# Patient Record
Sex: Female | Born: 1935
Health system: Southern US, Community
[De-identification: ages and names within clinical notes are randomized; demographics above are authoritative.]

## PROBLEM LIST (undated history)

## (undated) DIAGNOSIS — I639 Cerebral infarction, unspecified: Secondary | ICD-10-CM

## (undated) DIAGNOSIS — N289 Disorder of kidney and ureter, unspecified: Secondary | ICD-10-CM

## (undated) DIAGNOSIS — M1990 Unspecified osteoarthritis, unspecified site: Secondary | ICD-10-CM

## (undated) DIAGNOSIS — I10 Essential (primary) hypertension: Secondary | ICD-10-CM

## (undated) DIAGNOSIS — I2510 Atherosclerotic heart disease of native coronary artery without angina pectoris: Secondary | ICD-10-CM

## (undated) DIAGNOSIS — I509 Heart failure, unspecified: Secondary | ICD-10-CM

## (undated) DIAGNOSIS — Z5189 Encounter for other specified aftercare: Secondary | ICD-10-CM

## (undated) HISTORY — PX: ABDOMINAL HYSTERECTOMY: SHX81

## (undated) HISTORY — PX: JOINT REPLACEMENT: SHX530

## (undated) HISTORY — PX: EYE SURGERY: SHX253

## (undated) HISTORY — PX: CARDIAC SURGERY: SHX584

---

## 2001-07-20 ENCOUNTER — Ambulatory Visit (HOSPITAL_COMMUNITY): Admission: RE | Admit: 2001-07-20 | Discharge: 2001-07-21 | Payer: Self-pay | Admitting: *Deleted

## 2004-08-10 ENCOUNTER — Other Ambulatory Visit: Payer: Self-pay

## 2004-08-10 ENCOUNTER — Inpatient Hospital Stay: Payer: Self-pay | Admitting: Cardiology

## 2004-08-11 ENCOUNTER — Other Ambulatory Visit: Payer: Self-pay

## 2004-08-12 ENCOUNTER — Other Ambulatory Visit: Payer: Self-pay

## 2004-08-14 ENCOUNTER — Inpatient Hospital Stay (HOSPITAL_COMMUNITY)
Admission: AD | Admit: 2004-08-14 | Discharge: 2004-08-15 | Payer: Self-pay | Admitting: Thoracic Surgery (Cardiothoracic Vascular Surgery)

## 2004-09-16 ENCOUNTER — Other Ambulatory Visit: Payer: Self-pay

## 2004-09-16 ENCOUNTER — Inpatient Hospital Stay: Payer: Self-pay | Admitting: Cardiology

## 2005-01-30 ENCOUNTER — Other Ambulatory Visit: Payer: Self-pay

## 2005-01-30 ENCOUNTER — Emergency Department: Payer: Self-pay | Admitting: Emergency Medicine

## 2005-02-03 ENCOUNTER — Ambulatory Visit: Payer: Self-pay

## 2005-02-05 ENCOUNTER — Observation Stay: Payer: Self-pay | Admitting: Internal Medicine

## 2005-02-19 ENCOUNTER — Emergency Department: Payer: Self-pay | Admitting: Emergency Medicine

## 2005-06-03 ENCOUNTER — Emergency Department: Payer: Self-pay | Admitting: Emergency Medicine

## 2005-06-03 ENCOUNTER — Other Ambulatory Visit: Payer: Self-pay

## 2006-04-12 ENCOUNTER — Inpatient Hospital Stay: Payer: Self-pay | Admitting: Internal Medicine

## 2006-04-12 ENCOUNTER — Other Ambulatory Visit: Payer: Self-pay

## 2006-05-10 ENCOUNTER — Ambulatory Visit (HOSPITAL_COMMUNITY): Admission: RE | Admit: 2006-05-10 | Discharge: 2006-05-11 | Payer: Self-pay | Admitting: *Deleted

## 2006-08-13 ENCOUNTER — Ambulatory Visit: Payer: Self-pay | Admitting: Specialist

## 2007-01-23 ENCOUNTER — Inpatient Hospital Stay: Payer: Self-pay | Admitting: Cardiology

## 2007-01-23 ENCOUNTER — Other Ambulatory Visit: Payer: Self-pay

## 2007-01-24 ENCOUNTER — Other Ambulatory Visit: Payer: Self-pay

## 2007-04-14 ENCOUNTER — Ambulatory Visit: Payer: Self-pay | Admitting: Internal Medicine

## 2007-06-22 ENCOUNTER — Other Ambulatory Visit: Payer: Self-pay

## 2007-06-22 ENCOUNTER — Emergency Department: Payer: Self-pay | Admitting: Unknown Physician Specialty

## 2007-06-29 ENCOUNTER — Inpatient Hospital Stay: Payer: Self-pay | Admitting: Specialist

## 2007-06-29 ENCOUNTER — Other Ambulatory Visit: Payer: Self-pay

## 2007-09-03 ENCOUNTER — Inpatient Hospital Stay: Payer: Self-pay | Admitting: Specialist

## 2007-09-03 ENCOUNTER — Other Ambulatory Visit: Payer: Self-pay

## 2007-09-05 ENCOUNTER — Other Ambulatory Visit: Payer: Self-pay

## 2008-04-24 ENCOUNTER — Ambulatory Visit: Payer: Self-pay | Admitting: Internal Medicine

## 2008-08-31 ENCOUNTER — Inpatient Hospital Stay: Payer: Self-pay | Admitting: *Deleted

## 2008-11-19 ENCOUNTER — Inpatient Hospital Stay: Payer: Self-pay | Admitting: Internal Medicine

## 2009-01-18 ENCOUNTER — Inpatient Hospital Stay: Payer: Self-pay | Admitting: Internal Medicine

## 2010-09-19 ENCOUNTER — Inpatient Hospital Stay: Payer: Self-pay | Admitting: Internal Medicine

## 2010-10-01 LAB — PATHOLOGY REPORT

## 2010-11-15 ENCOUNTER — Inpatient Hospital Stay: Payer: Self-pay | Admitting: Internal Medicine

## 2010-11-21 LAB — PATHOLOGY REPORT

## 2010-11-28 ENCOUNTER — Inpatient Hospital Stay: Payer: Self-pay | Admitting: *Deleted

## 2011-03-10 ENCOUNTER — Emergency Department: Payer: Self-pay | Admitting: *Deleted

## 2011-08-21 ENCOUNTER — Emergency Department: Payer: Self-pay | Admitting: *Deleted

## 2012-04-07 ENCOUNTER — Emergency Department: Payer: Self-pay | Admitting: Emergency Medicine

## 2012-04-07 LAB — COMPREHENSIVE METABOLIC PANEL
Albumin: 3.1 g/dL — ABNORMAL LOW (ref 3.4–5.0)
Alkaline Phosphatase: 93 U/L (ref 50–136)
Anion Gap: 7 (ref 7–16)
BUN: 28 mg/dL — ABNORMAL HIGH (ref 7–18)
Bilirubin,Total: 0.6 mg/dL (ref 0.2–1.0)
Calcium, Total: 9.4 mg/dL (ref 8.5–10.1)
Chloride: 112 mmol/L — ABNORMAL HIGH (ref 98–107)
Co2: 25 mmol/L (ref 21–32)
Creatinine: 1.37 mg/dL — ABNORMAL HIGH (ref 0.60–1.30)
EGFR (African American): 40 — ABNORMAL LOW
EGFR (Non-African Amer.): 35 — ABNORMAL LOW
Glucose: 123 mg/dL — ABNORMAL HIGH (ref 65–99)
Osmolality: 294 (ref 275–301)
Potassium: 4 mmol/L (ref 3.5–5.1)
SGOT(AST): 29 U/L (ref 15–37)
SGPT (ALT): 19 U/L (ref 12–78)
Sodium: 144 mmol/L (ref 136–145)
Total Protein: 7.1 g/dL (ref 6.4–8.2)

## 2012-04-07 LAB — URINALYSIS, COMPLETE
Bilirubin,UR: NEGATIVE
Glucose,UR: NEGATIVE mg/dL (ref 0–75)
Ketone: NEGATIVE
Nitrite: NEGATIVE
Ph: 6 (ref 4.5–8.0)
Protein: >=500
RBC,UR: 1 /HPF (ref 0–5)
Specific Gravity: 1.011 (ref 1.003–1.030)
Squamous Epithelial: NONE SEEN
WBC UR: 43 /HPF (ref 0–5)

## 2012-04-07 LAB — CBC WITH DIFFERENTIAL/PLATELET
Basophil #: 0 10*3/uL (ref 0.0–0.1)
Basophil %: 0.4 %
Eosinophil #: 0 10*3/uL (ref 0.0–0.7)
Eosinophil %: 0.8 %
HCT: 36.7 % (ref 35.0–47.0)
HGB: 12.5 g/dL (ref 12.0–16.0)
Lymphocyte #: 0.6 10*3/uL — ABNORMAL LOW (ref 1.0–3.6)
Lymphocyte %: 9.1 %
MCH: 34.6 pg — ABNORMAL HIGH (ref 26.0–34.0)
MCHC: 34.1 g/dL (ref 32.0–36.0)
MCV: 102 fL — ABNORMAL HIGH (ref 80–100)
Monocyte #: 0.6 x10 3/mm (ref 0.2–0.9)
Monocyte %: 9.5 %
Neutrophil #: 5 10*3/uL (ref 1.4–6.5)
Neutrophil %: 80.2 %
Platelet: 136 10*3/uL — ABNORMAL LOW (ref 150–440)
RBC: 3.61 10*6/uL — ABNORMAL LOW (ref 3.80–5.20)
RDW: 13.2 % (ref 11.5–14.5)
WBC: 6.2 10*3/uL (ref 3.6–11.0)

## 2012-04-07 LAB — TROPONIN I: Troponin-I: <0.02 ng/mL

## 2012-04-09 LAB — URINE CULTURE

## 2012-08-15 ENCOUNTER — Emergency Department: Payer: Self-pay | Admitting: Emergency Medicine

## 2012-08-16 ENCOUNTER — Other Ambulatory Visit: Payer: Self-pay | Admitting: Internal Medicine

## 2012-08-17 NOTE — Telephone Encounter (Signed)
She has not called and made an appt with me.  I am not going to refill these meds.  These need to be sent to [facility].

## 2012-08-20 ENCOUNTER — Emergency Department: Payer: Self-pay | Admitting: Emergency Medicine

## 2012-09-10 ENCOUNTER — Observation Stay: Payer: Self-pay | Admitting: Specialist

## 2012-09-10 LAB — RAPID INFLUENZA A&B ANTIGENS

## 2012-09-10 LAB — COMPREHENSIVE METABOLIC PANEL
Albumin: 2.6 g/dL — ABNORMAL LOW (ref 3.4–5.0)
Alkaline Phosphatase: 94 U/L (ref 50–136)
Anion Gap: 8 (ref 7–16)
BUN: 22 mg/dL — ABNORMAL HIGH (ref 7–18)
Bilirubin,Total: 0.4 mg/dL (ref 0.2–1.0)
Calcium, Total: 8.8 mg/dL (ref 8.5–10.1)
Chloride: 111 mmol/L — ABNORMAL HIGH (ref 98–107)
Co2: 24 mmol/L (ref 21–32)
Creatinine: 1.57 mg/dL — ABNORMAL HIGH (ref 0.60–1.30)
EGFR (African American): 34 — ABNORMAL LOW
EGFR (Non-African Amer.): 30 — ABNORMAL LOW
Glucose: 130 mg/dL — ABNORMAL HIGH (ref 65–99)
Osmolality: 290 (ref 275–301)
Potassium: 4.1 mmol/L (ref 3.5–5.1)
SGOT(AST): 21 U/L (ref 15–37)
SGPT (ALT): 13 U/L (ref 12–78)
Sodium: 143 mmol/L (ref 136–145)
Total Protein: 6.3 g/dL — ABNORMAL LOW (ref 6.4–8.2)

## 2012-09-10 LAB — CBC WITH DIFFERENTIAL/PLATELET
Basophil #: 0 10*3/uL (ref 0.0–0.1)
Basophil %: 0.3 %
Eosinophil #: 0.1 10*3/uL (ref 0.0–0.7)
Eosinophil %: 2.1 %
HCT: 34.1 % — ABNORMAL LOW (ref 35.0–47.0)
HGB: 11.7 g/dL — ABNORMAL LOW (ref 12.0–16.0)
Lymphocyte #: 1 10*3/uL (ref 1.0–3.6)
Lymphocyte %: 14.5 %
MCH: 34 pg (ref 26.0–34.0)
MCHC: 34.4 g/dL (ref 32.0–36.0)
MCV: 99 fL (ref 80–100)
Monocyte #: 0.8 x10 3/mm (ref 0.2–0.9)
Monocyte %: 11.6 %
Neutrophil #: 4.8 10*3/uL (ref 1.4–6.5)
Neutrophil %: 71.5 %
Platelet: 161 10*3/uL (ref 150–440)
RBC: 3.44 10*6/uL — ABNORMAL LOW (ref 3.80–5.20)
RDW: 13.4 % (ref 11.5–14.5)
WBC: 6.7 10*3/uL (ref 3.6–11.0)

## 2012-09-10 LAB — TROPONIN I
Troponin-I: <0.02 ng/mL
Troponin-I: <0.02 ng/mL

## 2012-09-10 LAB — CK TOTAL AND CKMB (NOT AT ARMC)
CK, Total: 24 U/L (ref 21–215)
CK, Total: 23 U/L (ref 21–215)
CK-MB: <0.5 ng/mL — ABNORMAL LOW (ref 0.5–3.6)
CK-MB: <0.5 ng/mL — ABNORMAL LOW (ref 0.5–3.6)

## 2012-09-10 LAB — HEMOGLOBIN A1C: Hemoglobin A1C: 5 % (ref 4.2–6.3)

## 2012-09-11 LAB — COMPREHENSIVE METABOLIC PANEL
Albumin: 2.4 g/dL — ABNORMAL LOW (ref 3.4–5.0)
Alkaline Phosphatase: 89 U/L (ref 50–136)
Anion Gap: 8 (ref 7–16)
BUN: 22 mg/dL — ABNORMAL HIGH (ref 7–18)
Bilirubin,Total: 0.3 mg/dL (ref 0.2–1.0)
Calcium, Total: 8.5 mg/dL (ref 8.5–10.1)
Chloride: 112 mmol/L — ABNORMAL HIGH (ref 98–107)
Co2: 22 mmol/L (ref 21–32)
Creatinine: 1.48 mg/dL — ABNORMAL HIGH (ref 0.60–1.30)
EGFR (African American): 37 — ABNORMAL LOW
EGFR (Non-African Amer.): 32 — ABNORMAL LOW
Glucose: 136 mg/dL — ABNORMAL HIGH (ref 65–99)
Osmolality: 289 (ref 275–301)
Potassium: 4.3 mmol/L (ref 3.5–5.1)
SGOT(AST): 19 U/L (ref 15–37)
SGPT (ALT): 13 U/L (ref 12–78)
Sodium: 142 mmol/L (ref 136–145)
Total Protein: 6.3 g/dL — ABNORMAL LOW (ref 6.4–8.2)

## 2012-09-11 LAB — CBC WITH DIFFERENTIAL/PLATELET
Basophil #: 0 10*3/uL (ref 0.0–0.1)
Basophil %: 0.1 %
Eosinophil #: 0 10*3/uL (ref 0.0–0.7)
Eosinophil %: 0 %
HCT: 33.2 % — ABNORMAL LOW (ref 35.0–47.0)
HGB: 11.3 g/dL — ABNORMAL LOW (ref 12.0–16.0)
Lymphocyte #: 0.5 10*3/uL — ABNORMAL LOW (ref 1.0–3.6)
Lymphocyte %: 22.8 %
MCH: 33.5 pg (ref 26.0–34.0)
MCHC: 33.9 g/dL (ref 32.0–36.0)
MCV: 99 fL (ref 80–100)
Monocyte #: 0.1 x10 3/mm — ABNORMAL LOW (ref 0.2–0.9)
Monocyte %: 3.3 %
Neutrophil #: 1.7 10*3/uL (ref 1.4–6.5)
Neutrophil %: 73.8 %
Platelet: 130 10*3/uL — ABNORMAL LOW (ref 150–440)
RBC: 3.36 10*6/uL — ABNORMAL LOW (ref 3.80–5.20)
RDW: 13.4 % (ref 11.5–14.5)
WBC: 2.4 10*3/uL — ABNORMAL LOW (ref 3.6–11.0)

## 2012-09-11 LAB — CK TOTAL AND CKMB (NOT AT ARMC)
CK, Total: 20 U/L — ABNORMAL LOW (ref 21–215)
CK-MB: 0.5 ng/mL (ref 0.5–3.6)

## 2012-09-11 LAB — OCCULT BLOOD X 1 CARD TO LAB, STOOL: Occult Blood, Feces: NEGATIVE

## 2012-09-11 LAB — TROPONIN I: Troponin-I: <0.02 ng/mL

## 2012-09-12 LAB — CBC WITH DIFFERENTIAL/PLATELET
Basophil #: 0 10*3/uL (ref 0.0–0.1)
Basophil %: 0.1 %
Eosinophil #: 0 10*3/uL (ref 0.0–0.7)
Eosinophil %: 0 %
HCT: 35.8 % (ref 35.0–47.0)
HGB: 12 g/dL (ref 12.0–16.0)
Lymphocyte #: 0.8 10*3/uL — ABNORMAL LOW (ref 1.0–3.6)
Lymphocyte %: 11 %
MCH: 33.1 pg (ref 26.0–34.0)
MCHC: 33.4 g/dL (ref 32.0–36.0)
MCV: 99 fL (ref 80–100)
Monocyte #: 0.4 x10 3/mm (ref 0.2–0.9)
Monocyte %: 5.9 %
Neutrophil #: 6 10*3/uL (ref 1.4–6.5)
Neutrophil %: 83 %
Platelet: 178 10*3/uL (ref 150–440)
RBC: 3.62 10*6/uL — ABNORMAL LOW (ref 3.80–5.20)
RDW: 13.4 % (ref 11.5–14.5)
WBC: 7.3 10*3/uL (ref 3.6–11.0)

## 2012-09-16 LAB — CULTURE, BLOOD (SINGLE)

## 2012-10-12 ENCOUNTER — Other Ambulatory Visit: Payer: Self-pay | Admitting: Internal Medicine

## 2012-10-12 NOTE — Telephone Encounter (Signed)
Pt has not been seen yet.

## 2012-10-15 NOTE — Telephone Encounter (Signed)
Refill request for prilosec.  Pt has not been seen in office yet and has no upcoming appts scheduled.  Please advise.

## 2012-10-16 NOTE — Telephone Encounter (Signed)
Need to notify apothecary ([facility])  that pt has not scheduled an appt here - need to forward refill request to [facility] clinic

## 2013-01-06 LAB — COMPREHENSIVE METABOLIC PANEL
Albumin: 2.7 g/dL — ABNORMAL LOW (ref 3.4–5.0)
Alkaline Phosphatase: 96 U/L (ref 50–136)
Anion Gap: 7 (ref 7–16)
BUN: 42 mg/dL — ABNORMAL HIGH (ref 7–18)
Bilirubin,Total: 0.2 mg/dL (ref 0.2–1.0)
Calcium, Total: 8.5 mg/dL (ref 8.5–10.1)
Chloride: 113 mmol/L — ABNORMAL HIGH (ref 98–107)
Co2: 23 mmol/L (ref 21–32)
Creatinine: 1.97 mg/dL — ABNORMAL HIGH (ref 0.60–1.30)
EGFR (African American): 26 — ABNORMAL LOW
EGFR (Non-African Amer.): 22 — ABNORMAL LOW
Glucose: 175 mg/dL — ABNORMAL HIGH (ref 65–99)
Osmolality: 300 (ref 275–301)
Potassium: 4.4 mmol/L (ref 3.5–5.1)
SGOT(AST): 27 U/L (ref 15–37)
SGPT (ALT): 19 U/L (ref 12–78)
Sodium: 143 mmol/L (ref 136–145)
Total Protein: 6 g/dL — ABNORMAL LOW (ref 6.4–8.2)

## 2013-01-06 LAB — CBC WITH DIFFERENTIAL/PLATELET
Basophil #: 0 10*3/uL (ref 0.0–0.1)
Basophil %: 0.2 %
Eosinophil #: 0.1 10*3/uL (ref 0.0–0.7)
Eosinophil %: 1.8 %
HCT: 31.3 % — ABNORMAL LOW (ref 35.0–47.0)
HGB: 10.6 g/dL — ABNORMAL LOW (ref 12.0–16.0)
Lymphocyte #: 0.7 10*3/uL — ABNORMAL LOW (ref 1.0–3.6)
Lymphocyte %: 9.6 %
MCH: 34.4 pg — ABNORMAL HIGH (ref 26.0–34.0)
MCHC: 33.8 g/dL (ref 32.0–36.0)
MCV: 102 fL — ABNORMAL HIGH (ref 80–100)
Monocyte #: 0.5 x10 3/mm (ref 0.2–0.9)
Monocyte %: 7.7 %
Neutrophil #: 5.4 10*3/uL (ref 1.4–6.5)
Neutrophil %: 80.7 %
Platelet: 154 10*3/uL (ref 150–440)
RBC: 3.07 10*6/uL — ABNORMAL LOW (ref 3.80–5.20)
RDW: 13.2 % (ref 11.5–14.5)
WBC: 6.8 10*3/uL (ref 3.6–11.0)

## 2013-01-06 LAB — URINALYSIS, COMPLETE
Bilirubin,UR: NEGATIVE
Glucose,UR: 50 mg/dL (ref 0–75)
Hyaline Cast: 5
Ketone: NEGATIVE
Nitrite: NEGATIVE
Ph: 6 (ref 4.5–8.0)
Protein: >=500
Squamous Epithelial: <1
WBC UR: 3 /HPF (ref 0–5)

## 2013-01-06 LAB — CK TOTAL AND CKMB (NOT AT ARMC)
CK, Total: 29 U/L (ref 21–215)
CK-MB: 0.6 ng/mL (ref 0.5–3.6)

## 2013-01-06 LAB — PROTIME-INR
INR: 1
Prothrombin Time: 13 secs (ref 11.5–14.7)

## 2013-01-06 LAB — TROPONIN I: Troponin-I: <0.02 ng/mL

## 2013-01-07 ENCOUNTER — Inpatient Hospital Stay: Payer: Self-pay | Admitting: Internal Medicine

## 2013-01-07 LAB — CK TOTAL AND CKMB (NOT AT ARMC)
CK, Total: 70 U/L (ref 21–215)
CK, Total: 70 U/L (ref 21–215)
CK-MB: 1.5 ng/mL (ref 0.5–3.6)

## 2013-01-07 LAB — TROPONIN I: Troponin-I: 0.03 ng/mL

## 2013-01-07 LAB — OCCULT BLOOD X 1 CARD TO LAB, STOOL: Occult Blood, Feces: NEGATIVE

## 2013-01-08 LAB — COMPREHENSIVE METABOLIC PANEL
Albumin: 2.4 g/dL — ABNORMAL LOW (ref 3.4–5.0)
Anion Gap: 6 — ABNORMAL LOW (ref 7–16)
BUN: 26 mg/dL — ABNORMAL HIGH (ref 7–18)
Bilirubin,Total: 0.3 mg/dL (ref 0.2–1.0)
Chloride: 116 mmol/L — ABNORMAL HIGH (ref 98–107)
Creatinine: 1.48 mg/dL — ABNORMAL HIGH (ref 0.60–1.30)
EGFR (African American): 37 — ABNORMAL LOW
EGFR (Non-African Amer.): 32 — ABNORMAL LOW
Osmolality: 293 (ref 275–301)
Potassium: 4.1 mmol/L (ref 3.5–5.1)
SGOT(AST): 48 U/L — ABNORMAL HIGH (ref 15–37)
SGPT (ALT): 18 U/L (ref 12–78)
Sodium: 145 mmol/L (ref 136–145)
Total Protein: 5.5 g/dL — ABNORMAL LOW (ref 6.4–8.2)

## 2013-01-08 LAB — MAGNESIUM: Magnesium: 1.8 mg/dL

## 2013-01-08 LAB — LIPID PANEL
Cholesterol: 119 mg/dL (ref 0–200)
HDL Cholesterol: 51 mg/dL (ref 40–60)
Triglycerides: 82 mg/dL (ref 0–200)

## 2013-01-08 LAB — CBC WITH DIFFERENTIAL/PLATELET
Basophil #: 0 10*3/uL (ref 0.0–0.1)
Basophil %: 0.6 %
HCT: 30.3 % — ABNORMAL LOW (ref 35.0–47.0)
Lymphocyte %: 26.5 %
MCH: 34.4 pg — ABNORMAL HIGH (ref 26.0–34.0)
MCV: 101 fL — ABNORMAL HIGH (ref 80–100)
Neutrophil #: 2.3 10*3/uL (ref 1.4–6.5)
Neutrophil %: 55.2 %
Platelet: 142 10*3/uL — ABNORMAL LOW (ref 150–440)
RDW: 12.9 % (ref 11.5–14.5)
WBC: 4.2 10*3/uL (ref 3.6–11.0)

## 2013-01-08 LAB — TSH: Thyroid Stimulating Horm: 1.24 u[IU]/mL

## 2013-01-15 ENCOUNTER — Emergency Department: Payer: Self-pay | Admitting: Emergency Medicine

## 2013-01-15 LAB — COMPREHENSIVE METABOLIC PANEL
Albumin: 3 g/dL — ABNORMAL LOW (ref 3.4–5.0)
Alkaline Phosphatase: 107 U/L (ref 50–136)
Anion Gap: 8 (ref 7–16)
Bilirubin,Total: 0.2 mg/dL (ref 0.2–1.0)
Calcium, Total: 9.3 mg/dL (ref 8.5–10.1)
Chloride: 109 mmol/L — ABNORMAL HIGH (ref 98–107)
Co2: 23 mmol/L (ref 21–32)
EGFR (African American): 20 — ABNORMAL LOW
Glucose: 148 mg/dL — ABNORMAL HIGH (ref 65–99)
Osmolality: 290 (ref 275–301)
Potassium: 4.4 mmol/L (ref 3.5–5.1)
SGOT(AST): 27 U/L (ref 15–37)
SGPT (ALT): 17 U/L (ref 12–78)
Total Protein: 7.2 g/dL (ref 6.4–8.2)

## 2013-01-15 LAB — CBC
HCT: 36.4 % (ref 35.0–47.0)
HGB: 12.2 g/dL (ref 12.0–16.0)
Platelet: 287 10*3/uL (ref 150–440)
RBC: 3.59 10*6/uL — ABNORMAL LOW (ref 3.80–5.20)
RDW: 13 % (ref 11.5–14.5)
WBC: 9.2 10*3/uL (ref 3.6–11.0)

## 2013-01-15 LAB — APTT: Activated PTT: 41.5 secs — ABNORMAL HIGH (ref 23.6–35.9)

## 2013-01-15 LAB — TROPONIN I: Troponin-I: 0.12 ng/mL — ABNORMAL HIGH

## 2013-01-15 LAB — PROTIME-INR: INR: 0.9

## 2013-07-31 ENCOUNTER — Inpatient Hospital Stay: Payer: Self-pay | Admitting: Family Medicine

## 2013-07-31 LAB — CBC
HCT: 33.8 % — ABNORMAL LOW (ref 35.0–47.0)
HGB: 11.1 g/dL — ABNORMAL LOW (ref 12.0–16.0)
MCH: 32.8 pg (ref 26.0–34.0)
MCHC: 32.9 g/dL (ref 32.0–36.0)
MCV: 100 fL (ref 80–100)
Platelet: 215 10*3/uL (ref 150–440)
RBC: 3.39 10*6/uL — ABNORMAL LOW (ref 3.80–5.20)
RDW: 13.9 % (ref 11.5–14.5)
WBC: 7.2 10*3/uL (ref 3.6–11.0)

## 2013-07-31 LAB — COMPREHENSIVE METABOLIC PANEL
Albumin: 3 g/dL — ABNORMAL LOW (ref 3.4–5.0)
Alkaline Phosphatase: 114 U/L
Anion Gap: 5 — ABNORMAL LOW (ref 7–16)
BUN: 43 mg/dL — ABNORMAL HIGH (ref 7–18)
Bilirubin,Total: 0.3 mg/dL (ref 0.2–1.0)
Calcium, Total: 9 mg/dL (ref 8.5–10.1)
Chloride: 108 mmol/L — ABNORMAL HIGH (ref 98–107)
Co2: 28 mmol/L (ref 21–32)
Creatinine: 1.66 mg/dL — ABNORMAL HIGH (ref 0.60–1.30)
EGFR (African American): 32 — ABNORMAL LOW
EGFR (Non-African Amer.): 27 — ABNORMAL LOW
Glucose: 191 mg/dL — ABNORMAL HIGH (ref 65–99)
Osmolality: 297 (ref 275–301)
Potassium: 4.8 mmol/L (ref 3.5–5.1)
SGOT(AST): 19 U/L (ref 15–37)
SGPT (ALT): 20 U/L (ref 12–78)
Sodium: 141 mmol/L (ref 136–145)
Total Protein: 6.5 g/dL (ref 6.4–8.2)

## 2013-07-31 LAB — TROPONIN I: Troponin-I: <0.02 ng/mL

## 2013-08-01 LAB — BASIC METABOLIC PANEL
Anion Gap: 6 — ABNORMAL LOW (ref 7–16)
BUN: 38 mg/dL — ABNORMAL HIGH (ref 7–18)
Calcium, Total: 9 mg/dL (ref 8.5–10.1)
Chloride: 111 mmol/L — ABNORMAL HIGH (ref 98–107)
Co2: 26 mmol/L (ref 21–32)
Creatinine: 1.56 mg/dL — ABNORMAL HIGH (ref 0.60–1.30)
EGFR (African American): 34 — ABNORMAL LOW
EGFR (Non-African Amer.): 30 — ABNORMAL LOW
Glucose: 111 mg/dL — ABNORMAL HIGH (ref 65–99)
Osmolality: 295 (ref 275–301)
Potassium: 4.5 mmol/L (ref 3.5–5.1)
Sodium: 143 mmol/L (ref 136–145)

## 2013-08-01 LAB — CBC WITH DIFFERENTIAL/PLATELET
Basophil #: 0 10*3/uL (ref 0.0–0.1)
Basophil %: 0.3 %
Eosinophil #: 0.1 10*3/uL (ref 0.0–0.7)
Eosinophil %: 2.8 %
HCT: 26.2 % — ABNORMAL LOW (ref 35.0–47.0)
HGB: 8.8 g/dL — ABNORMAL LOW (ref 12.0–16.0)
Lymphocyte #: 1.1 10*3/uL (ref 1.0–3.6)
Lymphocyte %: 22.3 %
MCH: 32.9 pg (ref 26.0–34.0)
MCHC: 33.7 g/dL (ref 32.0–36.0)
MCV: 98 fL (ref 80–100)
Monocyte #: 0.7 x10 3/mm (ref 0.2–0.9)
Monocyte %: 13.2 %
Neutrophil #: 3.1 10*3/uL (ref 1.4–6.5)
Neutrophil %: 61.4 %
Platelet: 169 10*3/uL (ref 150–440)
RBC: 2.68 10*6/uL — ABNORMAL LOW (ref 3.80–5.20)
RDW: 14.1 % (ref 11.5–14.5)
WBC: 5.1 10*3/uL (ref 3.6–11.0)

## 2013-08-01 LAB — HEMOGLOBIN
HGB: 8.8 g/dL — ABNORMAL LOW (ref 12.0–16.0)
HGB: 8.2 g/dL — ABNORMAL LOW (ref 12.0–16.0)

## 2013-08-01 LAB — CK TOTAL AND CKMB (NOT AT ARMC)
CK, Total: 37 U/L (ref 21–215)
CK-MB: 2.3 ng/mL (ref 0.5–3.6)

## 2013-08-02 LAB — CBC WITH DIFFERENTIAL/PLATELET
Basophil #: 0 10*3/uL (ref 0.0–0.1)
Basophil %: 0.3 %
Eosinophil #: 0.2 10*3/uL (ref 0.0–0.7)
Eosinophil %: 3 %
HCT: 28.4 % — ABNORMAL LOW (ref 35.0–47.0)
HGB: 9.3 g/dL — ABNORMAL LOW (ref 12.0–16.0)
Lymphocyte #: 1.1 10*3/uL (ref 1.0–3.6)
Lymphocyte %: 18.1 %
MCH: 32.1 pg (ref 26.0–34.0)
MCHC: 32.9 g/dL (ref 32.0–36.0)
MCV: 98 fL (ref 80–100)
Monocyte #: 0.7 x10 3/mm (ref 0.2–0.9)
Monocyte %: 11.7 %
Neutrophil #: 4 10*3/uL (ref 1.4–6.5)
Neutrophil %: 66.9 %
Platelet: 171 10*3/uL (ref 150–440)
RBC: 2.91 10*6/uL — ABNORMAL LOW (ref 3.80–5.20)
RDW: 13.6 % (ref 11.5–14.5)
WBC: 5.9 10*3/uL (ref 3.6–11.0)

## 2013-08-02 LAB — BASIC METABOLIC PANEL
Anion Gap: 9 (ref 7–16)
BUN: 32 mg/dL — ABNORMAL HIGH (ref 7–18)
Calcium, Total: 9.3 mg/dL (ref 8.5–10.1)
Chloride: 108 mmol/L — ABNORMAL HIGH (ref 98–107)
Co2: 23 mmol/L (ref 21–32)
Creatinine: 1.52 mg/dL — ABNORMAL HIGH (ref 0.60–1.30)
EGFR (African American): 35 — ABNORMAL LOW
EGFR (Non-African Amer.): 31 — ABNORMAL LOW
Glucose: 112 mg/dL — ABNORMAL HIGH (ref 65–99)
Osmolality: 287 (ref 275–301)
Potassium: 3.7 mmol/L (ref 3.5–5.1)
Sodium: 140 mmol/L (ref 136–145)

## 2013-08-02 LAB — HEMOGLOBIN: HGB: 8.5 g/dL — ABNORMAL LOW (ref 12.0–16.0)

## 2013-08-03 LAB — CBC WITH DIFFERENTIAL/PLATELET
Basophil #: 0 10*3/uL (ref 0.0–0.1)
Basophil %: 0.4 %
Eosinophil #: 0.1 10*3/uL (ref 0.0–0.7)
Eosinophil %: 2.3 %
HCT: 27 % — ABNORMAL LOW (ref 35.0–47.0)
HGB: 8.7 g/dL — ABNORMAL LOW (ref 12.0–16.0)
Lymphocyte #: 0.9 10*3/uL — ABNORMAL LOW (ref 1.0–3.6)
Lymphocyte %: 16.6 %
MCH: 31.2 pg (ref 26.0–34.0)
MCHC: 32.1 g/dL (ref 32.0–36.0)
MCV: 97 fL (ref 80–100)
Monocyte #: 0.8 x10 3/mm (ref 0.2–0.9)
Monocyte %: 14.5 %
Neutrophil #: 3.5 10*3/uL (ref 1.4–6.5)
Neutrophil %: 66.2 %
Platelet: 184 10*3/uL (ref 150–440)
RBC: 2.78 10*6/uL — ABNORMAL LOW (ref 3.80–5.20)
RDW: 13.6 % (ref 11.5–14.5)
WBC: 5.3 10*3/uL (ref 3.6–11.0)

## 2013-08-03 LAB — PROTIME-INR
INR: 1
Prothrombin Time: 13.3 secs (ref 11.5–14.7)

## 2013-08-03 LAB — BASIC METABOLIC PANEL
Anion Gap: 5 — ABNORMAL LOW (ref 7–16)
BUN: 27 mg/dL — ABNORMAL HIGH (ref 7–18)
Calcium, Total: 9 mg/dL (ref 8.5–10.1)
Chloride: 109 mmol/L — ABNORMAL HIGH (ref 98–107)
Co2: 27 mmol/L (ref 21–32)
Creatinine: 1.52 mg/dL — ABNORMAL HIGH (ref 0.60–1.30)
EGFR (African American): 35 — ABNORMAL LOW
EGFR (Non-African Amer.): 31 — ABNORMAL LOW
Glucose: 111 mg/dL — ABNORMAL HIGH (ref 65–99)
Osmolality: 287 (ref 275–301)
Potassium: 3.6 mmol/L (ref 3.5–5.1)
Sodium: 141 mmol/L (ref 136–145)

## 2013-08-03 LAB — URINALYSIS, COMPLETE
Bacteria: NONE SEEN
Bilirubin,UR: NEGATIVE
Blood: NEGATIVE
Glucose,UR: NEGATIVE mg/dL (ref 0–75)
Ketone: NEGATIVE
Leukocyte Esterase: NEGATIVE
Nitrite: NEGATIVE
Ph: 6 (ref 4.5–8.0)
Protein: 100
RBC,UR: <1 /HPF (ref 0–5)
Specific Gravity: 1.012 (ref 1.003–1.030)
Squamous Epithelial: 1
WBC UR: 1 /HPF (ref 0–5)

## 2013-08-03 LAB — HEMOGLOBIN A1C: Hemoglobin A1C: 4.9 % (ref 4.2–6.3)

## 2013-08-04 LAB — BASIC METABOLIC PANEL
Anion Gap: 4 — ABNORMAL LOW (ref 7–16)
BUN: 26 mg/dL — ABNORMAL HIGH (ref 7–18)
Calcium, Total: 8.8 mg/dL (ref 8.5–10.1)
Chloride: 111 mmol/L — ABNORMAL HIGH (ref 98–107)
Co2: 28 mmol/L (ref 21–32)
Creatinine: 1.61 mg/dL — ABNORMAL HIGH (ref 0.60–1.30)
EGFR (African American): 33 — ABNORMAL LOW
EGFR (Non-African Amer.): 28 — ABNORMAL LOW
Glucose: 110 mg/dL — ABNORMAL HIGH (ref 65–99)
Osmolality: 290 (ref 275–301)
Potassium: 3.7 mmol/L (ref 3.5–5.1)
Sodium: 143 mmol/L (ref 136–145)

## 2013-08-04 LAB — CBC WITH DIFFERENTIAL/PLATELET
Basophil #: 0 10*3/uL (ref 0.0–0.1)
Basophil %: 0.4 %
Eosinophil #: 0.2 10*3/uL (ref 0.0–0.7)
Eosinophil %: 3.4 %
HCT: 26.2 % — ABNORMAL LOW (ref 35.0–47.0)
HGB: 8.9 g/dL — ABNORMAL LOW (ref 12.0–16.0)
Lymphocyte #: 0.8 10*3/uL — ABNORMAL LOW (ref 1.0–3.6)
Lymphocyte %: 15.5 %
MCH: 33 pg (ref 26.0–34.0)
MCHC: 33.7 g/dL (ref 32.0–36.0)
MCV: 98 fL (ref 80–100)
Monocyte #: 0.8 x10 3/mm (ref 0.2–0.9)
Monocyte %: 14.7 %
Neutrophil #: 3.6 10*3/uL (ref 1.4–6.5)
Neutrophil %: 66 %
Platelet: 165 10*3/uL (ref 150–440)
RBC: 2.68 10*6/uL — ABNORMAL LOW (ref 3.80–5.20)
RDW: 13.5 % (ref 11.5–14.5)
WBC: 5.4 10*3/uL (ref 3.6–11.0)

## 2013-08-04 LAB — URINE CULTURE

## 2013-10-29 ENCOUNTER — Ambulatory Visit: Payer: Self-pay | Admitting: Internal Medicine

## 2013-11-04 ENCOUNTER — Inpatient Hospital Stay: Payer: Self-pay | Admitting: Family Medicine

## 2013-11-04 LAB — CBC
HCT: 31.7 % — ABNORMAL LOW (ref 35.0–47.0)
HGB: 10.3 g/dL — ABNORMAL LOW (ref 12.0–16.0)
MCH: 29.9 pg (ref 26.0–34.0)
MCHC: 32.5 g/dL (ref 32.0–36.0)
MCV: 92 fL (ref 80–100)
Platelet: 254 10*3/uL (ref 150–440)
RBC: 3.45 10*6/uL — AB (ref 3.80–5.20)
RDW: 15.5 % — ABNORMAL HIGH (ref 11.5–14.5)
WBC: 9.5 10*3/uL (ref 3.6–11.0)

## 2013-11-04 LAB — TROPONIN I
Troponin-I: 5.85 ng/mL — ABNORMAL HIGH
Troponin-I: >40 ng/mL

## 2013-11-04 LAB — PROTIME-INR
INR: 1
Prothrombin Time: 12.6 secs (ref 11.5–14.7)

## 2013-11-04 LAB — COMPREHENSIVE METABOLIC PANEL
ALBUMIN: 2.7 g/dL — AB (ref 3.4–5.0)
AST: 46 U/L — AB (ref 15–37)
Alkaline Phosphatase: 122 U/L — ABNORMAL HIGH
Anion Gap: 5 — ABNORMAL LOW (ref 7–16)
BUN: 38 mg/dL — ABNORMAL HIGH (ref 7–18)
Bilirubin,Total: 0.3 mg/dL (ref 0.2–1.0)
CO2: 25 mmol/L (ref 21–32)
Calcium, Total: 9.8 mg/dL (ref 8.5–10.1)
Chloride: 114 mmol/L — ABNORMAL HIGH (ref 98–107)
Creatinine: 1.83 mg/dL — ABNORMAL HIGH (ref 0.60–1.30)
GFR CALC AF AMER: 28 — AB
GFR CALC NON AF AMER: 24 — AB
GLUCOSE: 176 mg/dL — AB (ref 65–99)
OSMOLALITY: 300 (ref 275–301)
Potassium: 4.6 mmol/L (ref 3.5–5.1)
SGPT (ALT): 13 U/L (ref 12–78)
SODIUM: 144 mmol/L (ref 136–145)
TOTAL PROTEIN: 7 g/dL (ref 6.4–8.2)

## 2013-11-04 LAB — CK-MB
CK-MB: 35.2 ng/mL — ABNORMAL HIGH (ref 0.5–3.6)
CK-MB: 126.1 ng/mL — ABNORMAL HIGH (ref 0.5–3.6)

## 2013-11-04 LAB — PRO B NATRIURETIC PEPTIDE: B-Type Natriuretic Peptide: 6310 pg/mL — ABNORMAL HIGH (ref 0–450)

## 2013-11-04 LAB — APTT: Activated PTT: 35.7 secs (ref 23.6–35.9)

## 2013-11-05 LAB — CBC WITH DIFFERENTIAL/PLATELET
BASOS ABS: 0 10*3/uL (ref 0.0–0.1)
Basophil %: 0.3 %
EOS ABS: 0.1 10*3/uL (ref 0.0–0.7)
EOS PCT: 0.9 %
HCT: 27.3 % — ABNORMAL LOW (ref 35.0–47.0)
HGB: 8.5 g/dL — AB (ref 12.0–16.0)
Lymphocyte #: 1.2 10*3/uL (ref 1.0–3.6)
Lymphocyte %: 16.5 %
MCH: 28.5 pg (ref 26.0–34.0)
MCHC: 31.2 g/dL — ABNORMAL LOW (ref 32.0–36.0)
MCV: 91 fL (ref 80–100)
Monocyte #: 0.7 x10 3/mm (ref 0.2–0.9)
Monocyte %: 10.1 %
NEUTROS PCT: 72.2 %
Neutrophil #: 5.2 10*3/uL (ref 1.4–6.5)
Platelet: 193 10*3/uL (ref 150–440)
RBC: 2.99 10*6/uL — ABNORMAL LOW (ref 3.80–5.20)
RDW: 15.1 % — ABNORMAL HIGH (ref 11.5–14.5)
WBC: 7.3 10*3/uL (ref 3.6–11.0)

## 2013-11-05 LAB — BASIC METABOLIC PANEL
ANION GAP: 7 (ref 7–16)
BUN: 39 mg/dL — ABNORMAL HIGH (ref 7–18)
CALCIUM: 8.7 mg/dL (ref 8.5–10.1)
CHLORIDE: 113 mmol/L — AB (ref 98–107)
Co2: 23 mmol/L (ref 21–32)
Creatinine: 1.87 mg/dL — ABNORMAL HIGH (ref 0.60–1.30)
EGFR (Non-African Amer.): 24 — ABNORMAL LOW
GFR CALC AF AMER: 28 — AB
Glucose: 111 mg/dL — ABNORMAL HIGH (ref 65–99)
OSMOLALITY: 295 (ref 275–301)
Potassium: 4.1 mmol/L (ref 3.5–5.1)
Sodium: 143 mmol/L (ref 136–145)

## 2013-11-05 LAB — APTT
ACTIVATED PTT: 87.7 s — AB (ref 23.6–35.9)
Activated PTT: >160 secs (ref 23.6–35.9)

## 2013-11-06 LAB — BASIC METABOLIC PANEL
Anion Gap: 5 — ABNORMAL LOW (ref 7–16)
BUN: 41 mg/dL — ABNORMAL HIGH (ref 7–18)
CALCIUM: 8.6 mg/dL (ref 8.5–10.1)
Chloride: 110 mmol/L — ABNORMAL HIGH (ref 98–107)
Co2: 25 mmol/L (ref 21–32)
Creatinine: 2.01 mg/dL — ABNORMAL HIGH (ref 0.60–1.30)
EGFR (African American): 25 — ABNORMAL LOW
EGFR (Non-African Amer.): 22 — ABNORMAL LOW
Glucose: 110 mg/dL — ABNORMAL HIGH (ref 65–99)
Osmolality: 290 (ref 275–301)
Potassium: 3.8 mmol/L (ref 3.5–5.1)
Sodium: 140 mmol/L (ref 136–145)

## 2013-11-06 LAB — CBC WITH DIFFERENTIAL/PLATELET
BASOS ABS: 0 10*3/uL (ref 0.0–0.1)
Basophil %: 0.7 %
EOS ABS: 0.2 10*3/uL (ref 0.0–0.7)
Eosinophil %: 4.5 %
HCT: 26.6 % — ABNORMAL LOW (ref 35.0–47.0)
HGB: 8.4 g/dL — AB (ref 12.0–16.0)
Lymphocyte #: 1.1 10*3/uL (ref 1.0–3.6)
Lymphocyte %: 22.7 %
MCH: 28.7 pg (ref 26.0–34.0)
MCHC: 31.5 g/dL — ABNORMAL LOW (ref 32.0–36.0)
MCV: 91 fL (ref 80–100)
Monocyte #: 0.6 x10 3/mm (ref 0.2–0.9)
Monocyte %: 12 %
Neutrophil #: 2.8 10*3/uL (ref 1.4–6.5)
Neutrophil %: 60.1 %
PLATELETS: 200 10*3/uL (ref 150–440)
RBC: 2.91 10*6/uL — ABNORMAL LOW (ref 3.80–5.20)
RDW: 15.5 % — ABNORMAL HIGH (ref 11.5–14.5)
WBC: 4.7 10*3/uL (ref 3.6–11.0)

## 2013-11-06 LAB — TROPONIN I: Troponin-I: 18 ng/mL — ABNORMAL HIGH

## 2013-11-06 LAB — APTT: Activated PTT: 92.4 secs — ABNORMAL HIGH (ref 23.6–35.9)

## 2013-11-07 LAB — CBC WITH DIFFERENTIAL/PLATELET
BASOS PCT: 0.4 %
Basophil #: 0 10*3/uL (ref 0.0–0.1)
EOS ABS: 0.2 10*3/uL (ref 0.0–0.7)
EOS PCT: 4.5 %
HCT: 22.4 % — ABNORMAL LOW (ref 35.0–47.0)
HGB: 7.3 g/dL — ABNORMAL LOW (ref 12.0–16.0)
LYMPHS ABS: 1.2 10*3/uL (ref 1.0–3.6)
LYMPHS PCT: 30.3 %
MCH: 29.4 pg (ref 26.0–34.0)
MCHC: 32.5 g/dL (ref 32.0–36.0)
MCV: 90 fL (ref 80–100)
Monocyte #: 0.5 x10 3/mm (ref 0.2–0.9)
Monocyte %: 13.2 %
Neutrophil #: 2 10*3/uL (ref 1.4–6.5)
Neutrophil %: 51.6 %
Platelet: 165 10*3/uL (ref 150–440)
RBC: 2.48 10*6/uL — AB (ref 3.80–5.20)
RDW: 15.5 % — ABNORMAL HIGH (ref 11.5–14.5)
WBC: 4 10*3/uL (ref 3.6–11.0)

## 2013-11-07 LAB — BASIC METABOLIC PANEL
ANION GAP: 2 — AB (ref 7–16)
BUN: 41 mg/dL — ABNORMAL HIGH (ref 7–18)
Calcium, Total: 8.3 mg/dL — ABNORMAL LOW (ref 8.5–10.1)
Chloride: 112 mmol/L — ABNORMAL HIGH (ref 98–107)
Co2: 27 mmol/L (ref 21–32)
Creatinine: 2.06 mg/dL — ABNORMAL HIGH (ref 0.60–1.30)
EGFR (Non-African Amer.): 21 — ABNORMAL LOW
GFR CALC AF AMER: 24 — AB
Glucose: 101 mg/dL — ABNORMAL HIGH (ref 65–99)
OSMOLALITY: 292 (ref 275–301)
POTASSIUM: 3.8 mmol/L (ref 3.5–5.1)
Sodium: 141 mmol/L (ref 136–145)

## 2013-11-07 LAB — APTT: ACTIVATED PTT: 79.7 s — AB (ref 23.6–35.9)

## 2013-11-07 LAB — HEMOGLOBIN: HGB: 7.8 g/dL — AB (ref 12.0–16.0)

## 2013-11-08 LAB — BASIC METABOLIC PANEL
Anion Gap: 4 — ABNORMAL LOW (ref 7–16)
BUN: 40 mg/dL — AB (ref 7–18)
CO2: 25 mmol/L (ref 21–32)
CREATININE: 1.94 mg/dL — AB (ref 0.60–1.30)
Calcium, Total: 8.4 mg/dL — ABNORMAL LOW (ref 8.5–10.1)
Chloride: 113 mmol/L — ABNORMAL HIGH (ref 98–107)
GFR CALC AF AMER: 26 — AB
GFR CALC NON AF AMER: 23 — AB
Glucose: 93 mg/dL (ref 65–99)
OSMOLALITY: 293 (ref 275–301)
Potassium: 3.9 mmol/L (ref 3.5–5.1)
Sodium: 142 mmol/L (ref 136–145)

## 2013-11-08 LAB — HEMOGLOBIN: HGB: 10.7 g/dL — AB (ref 12.0–16.0)

## 2013-11-09 LAB — BASIC METABOLIC PANEL
Anion Gap: 3 — ABNORMAL LOW (ref 7–16)
BUN: 38 mg/dL — AB (ref 7–18)
Calcium, Total: 8.5 mg/dL (ref 8.5–10.1)
Chloride: 113 mmol/L — ABNORMAL HIGH (ref 98–107)
Co2: 25 mmol/L (ref 21–32)
Creatinine: 1.78 mg/dL — ABNORMAL HIGH (ref 0.60–1.30)
GFR CALC AF AMER: 29 — AB
GFR CALC NON AF AMER: 25 — AB
GLUCOSE: 102 mg/dL — AB (ref 65–99)
OSMOLALITY: 290 (ref 275–301)
Potassium: 4.1 mmol/L (ref 3.5–5.1)
Sodium: 141 mmol/L (ref 136–145)

## 2013-11-09 LAB — CBC WITH DIFFERENTIAL/PLATELET
BASOS ABS: 0 10*3/uL (ref 0.0–0.1)
Basophil %: 0.5 %
Eosinophil #: 0.4 10*3/uL (ref 0.0–0.7)
Eosinophil %: 7.3 %
HCT: 33.5 % — ABNORMAL LOW (ref 35.0–47.0)
HGB: 11.3 g/dL — AB (ref 12.0–16.0)
LYMPHS PCT: 19 %
Lymphocyte #: 1 10*3/uL (ref 1.0–3.6)
MCH: 29.9 pg (ref 26.0–34.0)
MCHC: 33.7 g/dL (ref 32.0–36.0)
MCV: 89 fL (ref 80–100)
Monocyte #: 0.6 x10 3/mm (ref 0.2–0.9)
Monocyte %: 11.9 %
Neutrophil #: 3.2 10*3/uL (ref 1.4–6.5)
Neutrophil %: 61.3 %
Platelet: 204 10*3/uL (ref 150–440)
RBC: 3.78 10*6/uL — ABNORMAL LOW (ref 3.80–5.20)
RDW: 15.6 % — ABNORMAL HIGH (ref 11.5–14.5)
WBC: 5.2 10*3/uL (ref 3.6–11.0)

## 2013-11-29 ENCOUNTER — Ambulatory Visit: Payer: Self-pay | Admitting: Internal Medicine

## 2014-07-14 ENCOUNTER — Observation Stay: Payer: Self-pay

## 2014-07-14 LAB — CBC WITH DIFFERENTIAL/PLATELET
BASOS ABS: 0 10*3/uL (ref 0.0–0.1)
BASOS PCT: 0.4 %
EOS ABS: 0.4 10*3/uL (ref 0.0–0.7)
EOS PCT: 4.5 %
HCT: 23.4 % — ABNORMAL LOW (ref 35.0–47.0)
HGB: 7 g/dL — AB (ref 12.0–16.0)
Lymphocyte #: 1 10*3/uL (ref 1.0–3.6)
Lymphocyte %: 13.3 %
MCH: 23.8 pg — ABNORMAL LOW (ref 26.0–34.0)
MCHC: 30.1 g/dL — AB (ref 32.0–36.0)
MCV: 79 fL — ABNORMAL LOW (ref 80–100)
MONO ABS: 0.7 x10 3/mm (ref 0.2–0.9)
MONOS PCT: 8.3 %
Neutrophil #: 5.8 10*3/uL (ref 1.4–6.5)
Neutrophil %: 73.5 %
Platelet: 250 10*3/uL (ref 150–440)
RBC: 2.96 10*6/uL — ABNORMAL LOW (ref 3.80–5.20)
RDW: 17.7 % — ABNORMAL HIGH (ref 11.5–14.5)
WBC: 7.9 10*3/uL (ref 3.6–11.0)

## 2014-07-14 LAB — CK TOTAL AND CKMB (NOT AT ARMC)
CK, TOTAL: 38 U/L
CK, Total: 42 U/L
CK, Total: 49 U/L
CK-MB: 1 ng/mL (ref 0.5–3.6)
CK-MB: 1.2 ng/mL (ref 0.5–3.6)
CK-MB: 1.4 ng/mL (ref 0.5–3.6)

## 2014-07-14 LAB — TROPONIN I
Troponin-I: <0.02 ng/mL
Troponin-I: <0.02 ng/mL
Troponin-I: 0.03 ng/mL

## 2014-07-14 LAB — URINALYSIS, COMPLETE
Bacteria: NONE SEEN
Bilirubin,UR: NEGATIVE
Blood: NEGATIVE
GLUCOSE, UR: NEGATIVE mg/dL (ref 0–75)
Hyaline Cast: 5
Ketone: NEGATIVE
Leukocyte Esterase: NEGATIVE
Nitrite: NEGATIVE
Ph: 6 (ref 4.5–8.0)
SPECIFIC GRAVITY: 1.011 (ref 1.003–1.030)
Squamous Epithelial: NONE SEEN
WBC UR: <1 /HPF (ref 0–5)

## 2014-07-14 LAB — BASIC METABOLIC PANEL
ANION GAP: 4 — AB (ref 7–16)
BUN: 41 mg/dL — ABNORMAL HIGH (ref 7–18)
CHLORIDE: 110 mmol/L — AB (ref 98–107)
CO2: 29 mmol/L (ref 21–32)
Calcium, Total: 8 mg/dL — ABNORMAL LOW (ref 8.5–10.1)
Creatinine: 2.35 mg/dL — ABNORMAL HIGH (ref 0.60–1.30)
EGFR (Non-African Amer.): 21 — ABNORMAL LOW
GFR CALC AF AMER: 25 — AB
GLUCOSE: 127 mg/dL — AB (ref 65–99)
Osmolality: 297 (ref 275–301)
POTASSIUM: 4.5 mmol/L (ref 3.5–5.1)
SODIUM: 143 mmol/L (ref 136–145)

## 2014-07-15 LAB — BASIC METABOLIC PANEL
Anion Gap: 8 (ref 7–16)
BUN: 37 mg/dL — ABNORMAL HIGH (ref 7–18)
Calcium, Total: 8.6 mg/dL (ref 8.5–10.1)
Chloride: 111 mmol/L — ABNORMAL HIGH (ref 98–107)
Co2: 26 mmol/L (ref 21–32)
Creatinine: 2.13 mg/dL — ABNORMAL HIGH (ref 0.60–1.30)
EGFR (Non-African Amer.): 23 — ABNORMAL LOW
GFR CALC AF AMER: 28 — AB
Glucose: 93 mg/dL (ref 65–99)
OSMOLALITY: 297 (ref 275–301)
Potassium: 4.1 mmol/L (ref 3.5–5.1)
Sodium: 145 mmol/L (ref 136–145)

## 2014-07-15 LAB — CBC WITH DIFFERENTIAL/PLATELET
BASOS ABS: 0 10*3/uL (ref 0.0–0.1)
BASOS PCT: 0.4 %
Eosinophil #: 0.2 10*3/uL (ref 0.0–0.7)
Eosinophil %: 4.1 %
HCT: 25.9 % — AB (ref 35.0–47.0)
HGB: 8.3 g/dL — AB (ref 12.0–16.0)
LYMPHS PCT: 26.3 %
Lymphocyte #: 1.5 10*3/uL (ref 1.0–3.6)
MCH: 25.5 pg — ABNORMAL LOW (ref 26.0–34.0)
MCHC: 32.3 g/dL (ref 32.0–36.0)
MCV: 79 fL — ABNORMAL LOW (ref 80–100)
Monocyte #: 0.6 x10 3/mm (ref 0.2–0.9)
Monocyte %: 10.3 %
NEUTROS PCT: 58.9 %
Neutrophil #: 3.4 10*3/uL (ref 1.4–6.5)
PLATELETS: 227 10*3/uL (ref 150–440)
RBC: 3.28 10*6/uL — ABNORMAL LOW (ref 3.80–5.20)
RDW: 18.2 % — ABNORMAL HIGH (ref 11.5–14.5)
WBC: 5.7 10*3/uL (ref 3.6–11.0)

## 2014-08-30 ENCOUNTER — Inpatient Hospital Stay: Payer: Self-pay | Admitting: Family Medicine

## 2014-08-30 LAB — BASIC METABOLIC PANEL
Anion Gap: 5 — ABNORMAL LOW (ref 7–16)
BUN: 49 mg/dL — ABNORMAL HIGH (ref 7–18)
Calcium, Total: 8.9 mg/dL (ref 8.5–10.1)
Chloride: 108 mmol/L — ABNORMAL HIGH (ref 98–107)
Co2: 29 mmol/L (ref 21–32)
Creatinine: 2.19 mg/dL — ABNORMAL HIGH (ref 0.60–1.30)
EGFR (Non-African Amer.): 23 — ABNORMAL LOW
GFR CALC AF AMER: 27 — AB
GLUCOSE: 110 mg/dL — AB (ref 65–99)
Osmolality: 297 (ref 275–301)
Potassium: 4.5 mmol/L (ref 3.5–5.1)
Sodium: 142 mmol/L (ref 136–145)

## 2014-08-30 LAB — URINALYSIS, COMPLETE
BILIRUBIN, UR: NEGATIVE
Bacteria: NONE SEEN
Glucose,UR: NEGATIVE mg/dL (ref 0–75)
Ketone: NEGATIVE
Nitrite: NEGATIVE
Ph: 6 (ref 4.5–8.0)
Protein: 100
RBC,UR: 18 /HPF (ref 0–5)
Specific Gravity: 1.01 (ref 1.003–1.030)
Squamous Epithelial: 1
WBC UR: 242 /HPF (ref 0–5)

## 2014-08-30 LAB — CBC
HCT: 22.7 % — AB (ref 35.0–47.0)
HGB: 7.1 g/dL — ABNORMAL LOW (ref 12.0–16.0)
MCH: 25.3 pg — AB (ref 26.0–34.0)
MCHC: 31.3 g/dL — ABNORMAL LOW (ref 32.0–36.0)
MCV: 81 fL (ref 80–100)
PLATELETS: 298 10*3/uL (ref 150–440)
RBC: 2.82 10*6/uL — ABNORMAL LOW (ref 3.80–5.20)
RDW: 20.6 % — ABNORMAL HIGH (ref 11.5–14.5)
WBC: 7.1 10*3/uL (ref 3.6–11.0)

## 2014-08-30 LAB — PROTIME-INR
INR: 1
PROTHROMBIN TIME: 12.9 s (ref 11.5–14.7)

## 2014-08-30 LAB — MAGNESIUM: MAGNESIUM: 2.8 mg/dL — AB

## 2014-08-30 LAB — TROPONIN I: TROPONIN-I: 0.02 ng/mL

## 2014-08-31 DIAGNOSIS — D649 Anemia, unspecified: Secondary | ICD-10-CM | POA: Diagnosis not present

## 2014-08-31 DIAGNOSIS — K922 Gastrointestinal hemorrhage, unspecified: Secondary | ICD-10-CM | POA: Diagnosis not present

## 2014-08-31 DIAGNOSIS — K921 Melena: Secondary | ICD-10-CM | POA: Diagnosis not present

## 2014-08-31 DIAGNOSIS — D509 Iron deficiency anemia, unspecified: Secondary | ICD-10-CM | POA: Diagnosis not present

## 2014-08-31 DIAGNOSIS — I252 Old myocardial infarction: Secondary | ICD-10-CM | POA: Diagnosis not present

## 2014-08-31 DIAGNOSIS — N390 Urinary tract infection, site not specified: Secondary | ICD-10-CM | POA: Diagnosis not present

## 2014-08-31 LAB — CBC WITH DIFFERENTIAL/PLATELET
BASOS ABS: 0 10*3/uL (ref 0.0–0.1)
Basophil %: 0.5 %
EOS ABS: 0.3 10*3/uL (ref 0.0–0.7)
Eosinophil %: 5.6 %
HCT: 25.1 % — ABNORMAL LOW (ref 35.0–47.0)
HGB: 7.9 g/dL — AB (ref 12.0–16.0)
LYMPHS PCT: 23.1 %
Lymphocyte #: 1.4 10*3/uL (ref 1.0–3.6)
MCH: 25.8 pg — ABNORMAL LOW (ref 26.0–34.0)
MCHC: 31.4 g/dL — AB (ref 32.0–36.0)
MCV: 82 fL (ref 80–100)
Monocyte #: 0.7 x10 3/mm (ref 0.2–0.9)
Monocyte %: 12 %
NEUTROS ABS: 3.6 10*3/uL (ref 1.4–6.5)
NEUTROS PCT: 58.8 %
Platelet: 250 10*3/uL (ref 150–440)
RBC: 3.05 10*6/uL — ABNORMAL LOW (ref 3.80–5.20)
RDW: 19.4 % — AB (ref 11.5–14.5)
WBC: 6.1 10*3/uL (ref 3.6–11.0)

## 2014-08-31 LAB — BASIC METABOLIC PANEL
ANION GAP: 6 — AB (ref 7–16)
BUN: 44 mg/dL — AB (ref 7–18)
CO2: 28 mmol/L (ref 21–32)
CREATININE: 2.18 mg/dL — AB (ref 0.60–1.30)
Calcium, Total: 8.3 mg/dL — ABNORMAL LOW (ref 8.5–10.1)
Chloride: 110 mmol/L — ABNORMAL HIGH (ref 98–107)
EGFR (African American): 27 — ABNORMAL LOW
EGFR (Non-African Amer.): 23 — ABNORMAL LOW
Glucose: 108 mg/dL — ABNORMAL HIGH (ref 65–99)
Osmolality: 299 (ref 275–301)
Potassium: 3.9 mmol/L (ref 3.5–5.1)
Sodium: 144 mmol/L (ref 136–145)

## 2014-09-01 DIAGNOSIS — D649 Anemia, unspecified: Secondary | ICD-10-CM | POA: Diagnosis not present

## 2014-09-01 DIAGNOSIS — K921 Melena: Secondary | ICD-10-CM | POA: Diagnosis not present

## 2014-09-01 DIAGNOSIS — N390 Urinary tract infection, site not specified: Secondary | ICD-10-CM | POA: Diagnosis not present

## 2014-09-01 DIAGNOSIS — K922 Gastrointestinal hemorrhage, unspecified: Secondary | ICD-10-CM | POA: Diagnosis not present

## 2014-09-01 DIAGNOSIS — D509 Iron deficiency anemia, unspecified: Secondary | ICD-10-CM | POA: Diagnosis not present

## 2014-09-01 LAB — BASIC METABOLIC PANEL
ANION GAP: 8 (ref 7–16)
BUN: 35 mg/dL — ABNORMAL HIGH (ref 7–18)
CALCIUM: 8.7 mg/dL (ref 8.5–10.1)
CO2: 24 mmol/L (ref 21–32)
Chloride: 112 mmol/L — ABNORMAL HIGH (ref 98–107)
Creatinine: 1.91 mg/dL — ABNORMAL HIGH (ref 0.60–1.30)
EGFR (Non-African Amer.): 26 — ABNORMAL LOW
GFR CALC AF AMER: 32 — AB
Glucose: 95 mg/dL (ref 65–99)
Osmolality: 295 (ref 275–301)
POTASSIUM: 4 mmol/L (ref 3.5–5.1)
Sodium: 144 mmol/L (ref 136–145)

## 2014-09-01 LAB — CBC WITH DIFFERENTIAL/PLATELET
BASOS PCT: 0.7 %
Basophil #: 0 10*3/uL (ref 0.0–0.1)
EOS ABS: 0.2 10*3/uL (ref 0.0–0.7)
Eosinophil %: 4 %
HCT: 25.3 % — ABNORMAL LOW (ref 35.0–47.0)
HGB: 7.8 g/dL — ABNORMAL LOW (ref 12.0–16.0)
LYMPHS PCT: 17.6 %
Lymphocyte #: 1 10*3/uL (ref 1.0–3.6)
MCH: 25.6 pg — ABNORMAL LOW (ref 26.0–34.0)
MCHC: 30.9 g/dL — ABNORMAL LOW (ref 32.0–36.0)
MCV: 83 fL (ref 80–100)
MONO ABS: 0.6 x10 3/mm (ref 0.2–0.9)
Monocyte %: 10.7 %
NEUTROS PCT: 67 %
Neutrophil #: 3.9 10*3/uL (ref 1.4–6.5)
PLATELETS: 238 10*3/uL (ref 150–440)
RBC: 3.06 10*6/uL — ABNORMAL LOW (ref 3.80–5.20)
RDW: 19.5 % — ABNORMAL HIGH (ref 11.5–14.5)
WBC: 5.8 10*3/uL (ref 3.6–11.0)

## 2014-09-01 LAB — FERRITIN: Ferritin (ARMC): 21 ng/mL (ref 8–388)

## 2014-09-02 DIAGNOSIS — D509 Iron deficiency anemia, unspecified: Secondary | ICD-10-CM | POA: Diagnosis not present

## 2014-09-02 DIAGNOSIS — N390 Urinary tract infection, site not specified: Secondary | ICD-10-CM | POA: Diagnosis not present

## 2014-09-02 DIAGNOSIS — D649 Anemia, unspecified: Secondary | ICD-10-CM | POA: Diagnosis not present

## 2014-09-02 DIAGNOSIS — K921 Melena: Secondary | ICD-10-CM | POA: Diagnosis not present

## 2014-09-02 DIAGNOSIS — K922 Gastrointestinal hemorrhage, unspecified: Secondary | ICD-10-CM | POA: Diagnosis not present

## 2014-09-02 LAB — URINE CULTURE

## 2014-09-02 LAB — CBC WITH DIFFERENTIAL/PLATELET
Basophil #: 0 10*3/uL (ref 0.0–0.1)
Basophil %: 0.5 %
EOS PCT: 4.7 %
Eosinophil #: 0.3 10*3/uL (ref 0.0–0.7)
HCT: 26 % — ABNORMAL LOW (ref 35.0–47.0)
HGB: 8.2 g/dL — ABNORMAL LOW (ref 12.0–16.0)
Lymphocyte #: 0.9 10*3/uL — ABNORMAL LOW (ref 1.0–3.6)
Lymphocyte %: 14 %
MCH: 26.1 pg (ref 26.0–34.0)
MCHC: 31.7 g/dL — ABNORMAL LOW (ref 32.0–36.0)
MCV: 83 fL (ref 80–100)
MONOS PCT: 10.4 %
Monocyte #: 0.7 x10 3/mm (ref 0.2–0.9)
NEUTROS PCT: 70.4 %
Neutrophil #: 4.5 10*3/uL (ref 1.4–6.5)
PLATELETS: 254 10*3/uL (ref 150–440)
RBC: 3.15 10*6/uL — ABNORMAL LOW (ref 3.80–5.20)
RDW: 19.1 % — AB (ref 11.5–14.5)
WBC: 6.4 10*3/uL (ref 3.6–11.0)

## 2014-09-03 DIAGNOSIS — K922 Gastrointestinal hemorrhage, unspecified: Secondary | ICD-10-CM | POA: Diagnosis not present

## 2014-09-03 DIAGNOSIS — N390 Urinary tract infection, site not specified: Secondary | ICD-10-CM | POA: Diagnosis not present

## 2014-09-03 DIAGNOSIS — D649 Anemia, unspecified: Secondary | ICD-10-CM | POA: Diagnosis not present

## 2014-09-03 LAB — HEMOGLOBIN: HGB: 7.5 g/dL — ABNORMAL LOW (ref 12.0–16.0)

## 2014-09-25 DIAGNOSIS — K922 Gastrointestinal hemorrhage, unspecified: Secondary | ICD-10-CM | POA: Diagnosis not present

## 2014-09-25 DIAGNOSIS — I213 ST elevation (STEMI) myocardial infarction of unspecified site: Secondary | ICD-10-CM | POA: Diagnosis not present

## 2014-09-25 DIAGNOSIS — I259 Chronic ischemic heart disease, unspecified: Secondary | ICD-10-CM | POA: Diagnosis not present

## 2014-09-25 DIAGNOSIS — I129 Hypertensive chronic kidney disease with stage 1 through stage 4 chronic kidney disease, or unspecified chronic kidney disease: Secondary | ICD-10-CM | POA: Diagnosis not present

## 2014-12-21 NOTE — H&P (Signed)
PATIENT NAME:  Melissa Bright, Melissa Bright MR#:  [id number] DATE OF BIRTH:  Jan 25, 1924  DATE OF ADMISSION:  09/10/2012  PRIMARY CARE PHYSICIAN:  Dr. Dale Goldthwaite.   HISTORY OF PRESENT ILLNESS:  The patient is an 81 year old Caucasian female with past medical history significant for history of stroke, history of bilateral carotid artery stenosis, coronary artery disease, congestive heart failure, renal artery disease, CKD, presented to the hospital with complaints of cough as well as phlegm production.  According to patient's son as well as patient herself, she has been having problems with cough as well as phlegm production and some shortness of breath and wheezing for the past one week now.  While she was getting from her chair to the bedside commode she became suddenly so very weak that she collapsed in her bed again.  She was presyncopal.  She felt woozy.  For this reason, patient's family decided to bring her to the Emergency Room for further evaluation.  Apparently patient has been having cough as well as shortness of breath and wheezing.  She was noted to be very wheezy here in the hospital, was given some DuoNebs with improvement of her wheezing.  She is very weak to get sputum up and not able to stand well as mentioned above over the past day or two.  The patient's family were not able even to get her bathed or dressed because of that weakness.  She was also complaining of being cold.  On arrival to Emergency Room she was noted to be somewhat relatively hypoxic with oxygen saturations of 93% on room air and hospitalist service was contacted for admission.  The patient admits of having some yellow phlegm for the past one week.  She denies any lateralized weakness.  Admits to having some weight loss over the past few weeks as well.   PAST MEDICAL HISTORY:  History of stroke, history of bilateral carotid artery disease, right side was 75% to 95% stenosis, left side was 50% to 75% stenosis, was felt to be high risk  for surgery.  She was supposed to follow up with Dr. Wyn Quaker.  Coronary artery disease, CHF, systolic, chronic, cardiomyopathy with ejection fraction of 40%, renal artery stenosis status post stent placement, CKD stage 3, anemia, right shoulder arthritis, GI bleed in the past, hypothyroidism, B12 deficiency, also history of tobacco abuse in the past, quit 30 years ago, smoked approximately 20 years according to patient's family, history of transient ischemic attacks, history of diverticulosis per colonoscopy, history of aortic stenosis and moderate LVH, history of hyperlipidemia, glaucoma.  The patient has only one functional kidney due to renal artery stenosis, recurrent urinary infections, history of recurrent syncope episodes and vitamin D deficiency.   PAST SURGICAL HISTORY:  Bilateral knee arthroplasty, hysterectomy and kidney stent.   SOCIAL HISTORY:  No alcohol, tobacco or recreational drug abuse.  She used to smoke, quit 30 years ago, smoked for 20 years or more 1 pack a day.   FAMILY HISTORY:  No history of coronary artery disease, strokes or diabetes.   ALLERGIES:  MACROBID.   MEDICATIONS:  Aggrenox 25/200 one capsule twice a day, amlodipine 10 mg by mouth daily, atorvastatin 40 mg by mouth at bedtime, calcium with vitamin D 600/200 twice a day, carvedilol 6.25 mg by mouth twice daily, docusate 200 mg by mouth twice daily, iron sulfate 325 mg by mouth twice daily, fish oil 500 mg by mouth daily, hydralazine 50 mg by mouth 4 times daily, isosorbide mononitrate 60 mg by  mouth daily, latanoprost 0.005% ophthalmic solution 1 drop to each eye at bedtime, Levothyroxine 112 mcg by mouth daily, omeprazole 20 mg by mouth daily, sodium bicarbonate 650 mg by mouth twice daily, vitamin B12 one tablet which is 500 mcg sublingually once daily, vitamin B12 injectable solution 1000 mcg injectable once a month.   REVIEW OF SYSTEMS:  Positive for fatigue and weakness for the past one week, weight loss, some  blurring of vision, cataracts as well as glaucoma, cough, wheezes, yellow phlegm, felt wheezy earlier today, intermittent constipation, decreased urination today as well as urinary incontinence for which patient wears Depends.  Denies any high fevers.  Admits to having some chills, however.  No pains or weight gain.   EYES:  In regards to eyes, denies any double vision, glaucoma.  EARS, NOSE, THROAT:  Denies any tinnitus, allergies, epistaxis, sinus pain, dentures or difficulty swallowing.  RESPIRATORY:  Denies any hemoptysis, asthma, COPD. CARDIOVASCULAR:  Denies chest pains, orthopnea, edema, arrhythmias, palpitations or syncope.  GASTROINTESTINAL:  Denies nausea, vomiting, diarrhea, hematemesis, rectal bleeding, change in bowel habits.   GENITOURINARY:  Denies dysuria, hematuria.  Admits of decreased urination today, however quite good normal urination yesterday and good by mouth intake yesterday.   ENDOCRINE:  Denies any polydipsia, nocturia, thyroid problems, heat or cold intolerance.  HEMATOLOGIC:  Denies any anemia, easy bruising, bleeding, swollen glands.  SKIN:  Denies any acne, rash, lesions, changes in moles.  MUSCULOSKELETAL:  Denies arthritis, cramps, swelling.  NEUROLOGIC:  No numbness, epilepsy or tremor. PSYCHIATRY:  Denies anxiety, insomnia or depression.   PHYSICAL EXAMINATION: VITAL SIGNS:  On arrival to the hospital patient's vitals, temperature 98, pulse 57, respiratory rate was 18, blood pressure 133/60, saturation 93% on room air.  GENERAL:  Well-developed, well-nourished Caucasian female in no significant distress lying on the stretcher.  HEENT:  Her pupils are equal and reactive to light.  Extraocular muscles intact.  No icterus or conjunctivitis.  Has somewhat difficulty hearing, but able to answer well.  No pharyngeal erythema.  Mucosa is dry.  NECK:  No masses, supple, nontender.  No adenopathy.  No JVD or carotid bruits bilaterally.  Full range of motion.  LUNGS:   Clear to auscultation posteriorly.  A few crackles were heard posteriorly in upper right lung area; however, rales, rhonchi and crackles were heard anteriorly, especially whenever she coughs, diminished breath sounds were noted, somewhat labored respirations, especially whenever she coughs and increased effort.  No dullness to percussion.  Not in overt respiratory distress.  CARDIOVASCULAR:  S1, S2 appreciated.  No murmurs, gallops or rubs were noted.  PMI not lateralized.  Chest is nontender to palpation.  1+ pedal pulses.  No lower extremity edema, clubbing, or cyanosis noted.  ABDOMEN:  Soft, nontender.  Bowel sounds present.  No hepatosplenomegaly or masses were noted.  RECTAL: Deferred MUSCLE STRENGTH:  Able to move all extremities.  No cyanosis, degenerative joint disease.  The patient has mild kyphosis.  Gait is not tested.  SKIN:  Denies any rashes, lesions, erythema, nodularity, induration.  It was warm and dry to palpation.  LYMPHATIC:  No adenopathy in the cervical region.  NEUROLOGICAL:  Cranial nerves grossly intact.  Sensory is intact.  No dysarthria or aphasia.  The patient is alert, oriented to time, person, place, cooperative.  Memory is good.    PSYCHIATRIC:  No significant confusion, agitation or depression noted.   LABORATORY DATA:  BMP showed glucose 130, BUN and creatinine were elevated to 22 and 1.57.  The patient's last BMP was done in August 2013.  At that time, patient's BUN and creatinine were 28 and 1.37, otherwise BMP was unremarkable.  The patient's estimated GFR for non-African American would be 30.  Liver enzymes were unremarkable except the albumin level was low at 2.6.  Cardiac enzymes, first set negative.  CBC, white blood cell count 6.7, hemoglobin 11.7, platelet count 161, absolute neutrophil count is normal at 4.8. DDimer is normal at 0.29.  Influenza test is negative.  Urinalysis was not done yet.  EKG showed sinus brady at 67 beats per minute, possible left atrial  enlargement.  No specific ST-T wave abnormalities were noted, which is no significant difference from August 2013 EKG.   RADIOLOGIC STUDIES:  Chest x-ray PA and lateral 09/10/2012 revealed no evidence of pneumonia or CHF.  Cannot exclude perihilar subsegmental atelectasis as might be seen with acute bronchitis in appropriate clinical setting according to the radiologist.  Ultrasound of lower extremities bilaterally 09/10/2012 showed no evidence of thrombus within the right or left femoral or popliteal veins.   ASSESSMENT AND PLAN: 1.  Presyncope.  Admit patient to the medical floor to off unit telemetry, questionable if her presyncope is related to orthostatic hypotension, dehydration as patient is dehydrated clinically.  We will get orthostatics.  We will also get echocardiogram and repeat a carotid ultrasound.  2.  Dehydration.  We will continue patient on IV fluids.  3.  Chronic obstructive pulmonary disease exacerbation.  We will continue patient on Solu-Medrol as well as DuoNebs.  4.  Bronchitis.  We will start patient on Rocephin as well as Zithromax.  Get sputum cultures.  5.  Acute on chronic renal failure.  We will continue IV fluids.  We will get urinalysis whenever it is possible.  We will follow patient's creatinine levels.  6.  Hyperglycemia.  Check hemoglobin A1c.  7.  Malnutrition with evidence of dehydration, possibly acute infection related; however, we will get prealbumin checked.  8.  Anemia.  We will follow with rehydration.  We will also check guaiac.   TIME SPENT:  Fifty minutes.    ____________________________ Katharina Caper, MD rv:ea D: 09/10/2012 17:52:35 ET T: 09/11/2012 03:17:11 ET JOB#: [id number]  cc: Katharina Caper, MD, <Dictator> Dale Fries, MD  Saladin Petrelli MD ELECTRONICALLY SIGNED 10/20/2012 14:10

## 2014-12-21 NOTE — Consult Note (Signed)
Chief Complaint:  Subjective/Chief Complaint Please see full GI consult and brief consult note.  Patietn seen and examined, chart reviewed. Patietn admitted with hematochezia.  Paitent with h/o diverticulosis, however in 10/2010, I did a colonoscopy and placed clips on an actively bleeding dieulafoy lesion.  Currently hemodynamically stable, last bm about 6 hours ago, recheck hgb stable.  Some chest pain reported to nursing, PMD notified.  Continue serial hgb, transfuse as needed.  High risk for colonoscopy.  If bleeding is recurrent, recommend bleeding scan, and Vascular surgery consult if positive.  Following, discussed with patient and her daughter.   VITAL SIGNS/ANCILLARY NOTES: *Intake and Output.:   02-Dec-14 09:15  Stool  PT. HAD A BURGUNDY STOOL    13:15  Stool  pt. had a medium burgundy stool   Brief Assessment:  Cardiac Irregular   Respiratory clear BS   Gastrointestinal details normal Soft  Nontender  Nondistended  No masses palpable   Lab Results: Routine Hem:  01-Dec-14 12:41   Hemoglobin (CBC)  11.1  02-Dec-14 05:54   Hemoglobin (CBC)  8.8    15:43   Hemoglobin (CBC)  8.8 (Result(s) reported on 01 Aug 2013 at 04:16PM.)   Electronic Signatures: Barnetta ChapelSkulskie, Even Budlong (MD)  (Signed 02-Dec-14 19:31)  Authored: Chief Complaint, VITAL SIGNS/ANCILLARY NOTES, Brief Assessment, Lab Results   Last Updated: 02-Dec-14 19:31 by Barnetta ChapelSkulskie, Fareeha Evon (MD)

## 2014-12-21 NOTE — Consult Note (Signed)
Chief Complaint:  Subjective/Chief Complaint seen for hemnatochezia.  hemodynamically stable, bm but not grossly bloody.  denies abdominalpain and nausea.   VITAL SIGNS/ANCILLARY NOTES: **Vital Signs.:   04-Dec-14 04:30  Vital Signs Type Routine  Temperature Temperature (F) 98.2  Celsius 36.7  Temperature Source oral  Pulse Pulse 91  Respirations Respirations 18  Systolic BP Systolic BP 644  Diastolic BP (mmHg) Diastolic BP (mmHg) 72  Mean BP 107  Pulse Ox % Pulse Ox % 94  Pulse Ox Activity Level  At rest  Oxygen Delivery Room Air/ 21 %    08:14  Vital Signs Type Pre Medication  Systolic BP Systolic BP 034  Diastolic BP (mmHg) Diastolic BP (mmHg) 72  Mean BP 102    08:17  Telemetry pattern Cardiac Rhythm Normal sinus rhythm; pattern reported by Telemetry Clerk; HR 90    10:32  Telemetry pattern Cardiac Rhythm 77 sinus rhythm  *Intake and Output.:   04-Dec-14 07:21  Stool  medium small dark brown formed/loose    09:45  Stool  medium soft   Brief Assessment:  Cardiac Regular   Respiratory clear BS   Gastrointestinal details normal Soft  Nontender  Nondistended  No masses palpable  Bowel sounds normal   Lab Results: Routine Chem:  04-Dec-14 04:54   Hemoglobin A1c ([facility]) 4.9 (The American Diabetes Association recommends that a primary goal of therapy should be <7% and that physicians should reevaluate the treatment regimen in patients with HbA1c values consistently >8%.)  Glucose, Serum  111  BUN  27  Creatinine (comp)  1.52  Sodium, Serum 141  Potassium, Serum 3.6  Chloride, Serum  109  CO2, Serum 27  Calcium (Total), Serum 9.0  Anion Gap  5  Osmolality (calc) 287  eGFR (African American)  35  eGFR (Non-African American)  31 (eGFR values <30m/min/1.73 m2 may be an indication of chronic kidney disease (CKD). Calculated eGFR is useful in patients with stable renal function. The eGFR calculation will not be reliable in acutely ill patients when serum  creatinine is changing rapidly. It is not useful in  patients on dialysis. The eGFR calculation may not be applicable to patients at the low and high extremes of body sizes, pregnant women, and vegetarians.)  Routine UA:  04-Dec-14 10:16   Color (UA) Yellow  Clarity (UA) Clear  Glucose (UA) Negative  Bilirubin (UA) Negative  Ketones (UA) Negative  Specific Gravity (UA) 1.012  Blood (UA) Negative  pH (UA) 6.0  Protein (UA) 100 mg/dL  Nitrite (UA) Negative  Leukocyte Esterase (UA) Negative (Result(s) reported on 03 Aug 2013 at 11:05AM.)  RBC (UA) <1 /HPF  WBC (UA) 1 /HPF  Bacteria (UA) NONE SEEN  Epithelial Cells (UA) 1 /HPF (Result(s) reported on 03 Aug 2013 at 11:05AM.)  Routine Coag:  04-Dec-14 11:47   Prothrombin 13.3  INR 1.0 (INR reference interval applies to patients on anticoagulant therapy. A single INR therapeutic range for coumarins is not optimal for all indications; however, the suggested range for most indications is 2.0 - 3.0. Exceptions to the INR Reference Range may include: Prosthetic heart valves, acute myocardial infarction, prevention of myocardial infarction, and combinations of aspirin and anticoagulant. The need for a higher or lower target INR must be assessed individually. Reference: The Pharmacology and Management of the Vitamin K  antagonists: the seventh ACCP Conference on Antithrombotic and Thrombolytic Therapy. CVQQVZ.5638Sept:126 (3suppl): [id number] A HCT value >55% may artifactually increase the PT.  In one study,  the increase was  an average of 25%. Reference:  "Effect on Routine and Special Coagulation Testing Values of Citrate Anticoagulant Adjustment in Patients with High HCT Values." American Journal of Clinical Pathology 2006;126:400-405.)  Routine Hem:  04-Dec-14 11:47   WBC (CBC) 5.3  RBC (CBC)  2.78  Hemoglobin (CBC)  8.7  Hematocrit (CBC)  27.0  Platelet Count (CBC) 184  MCV 97  MCH 31.2  MCHC 32.1  RDW 13.6  Neutrophil %  66.2  Lymphocyte % 16.6  Monocyte % 14.5  Eosinophil % 2.3  Basophil % 0.4  Neutrophil # 3.5  Lymphocyte #  0.9  Monocyte # 0.8  Eosinophil # 0.1  Basophil # 0.0 (Result(s) reported on 03 Aug 2013 at 12:09PM.)   Assessment/Plan:  Assessment/Plan:  Assessment 1) hematochezia-not recurrent, stool brown, hemogram stable.  likely diverticular bleeding.   Plan 1) continue current, continue ppi daily as outpatient, GI o/p followup in 2 weeks.  call if needed.   Electronic Signatures: Loistine Simas (MD)  (Signed 04-Dec-14 13:01)  Authored: Chief Complaint, VITAL SIGNS/ANCILLARY NOTES, Brief Assessment, Lab Results, Assessment/Plan   Last Updated: 04-Dec-14 13:01 by Loistine Simas (MD)

## 2014-12-21 NOTE — Discharge Summary (Signed)
PATIENT NAME:  AMANEE, IACOVELLI MR#:  [id number] DATE OF BIRTH:  12-Sep-1923  DATE OF ADMISSION:  09/10/2012 DATE OF DISCHARGE:  09/12/2012  For a detailed note, please take a look at the history and physical done on admission by Dr. Winona Legato.   DIAGNOSES AT DISCHARGE: As follows: Presyncope likely related to orthostatic hypotension, acute bronchitis, hypertension, hypothyroidism, history of carotid artery stenosis, history of coronary artery disease, hyperlipidemia.   DIET: The patient is being discharged on a low-sodium, low-fat diet.   ACTIVITY: As tolerated.   FOLLOWUP: With Dr. Burnadette Pop in the next 1 to 2 weeks.   DISCHARGE MEDICATIONS: Omeprazole 20 mg daily, Coreg 6.25 mg b.i.d., sodium bicarbonate 650 mg b.i.d., Aggrenox 1 tab b.i.d., amlodipine 10 mg daily, latanoprost 0.005% one drop to each eye at bedtime, Synthroid 112 mcg daily, atorvastatin 40 mg at bedtime, hydralazine 50 mg q.i.d., fish oil daily, calcium and vitamin D 1 tab b.i.d., iron sulfate 325 mg b.i.d., vitamin B12 1 tab daily, Colace 100 mg b.i.d., vitamin B12 1000 mcg monthly, Imdur 60 mg daily, prednisone 10 mg tablets to be tapered over the next 4 days, 40, 30, 20, 10 and Zithromax 500 mg x4 days.   PERTINENT STUDIES DONE DURING THE HOSPITAL COURSE: A chest x-ray done on admission showing no evidence of pneumonia or CHF. Perihilar subsegmental atelectasis, may be seen with acute bronchitis. Ultrasound of the lower extremities showing no evidence of any DVT in right or left lower extremity. An ultrasound of the carotids showing evidence of carotid artery disease on the right between 75% to 95%, none on the left.   HOSPITAL COURSE: This is an 81 year old female who presented to the hospital with presyncopal and dizziness symptoms.  1. Presyncope. The likely cause of this was probably orthostatic hypotension. She had some relative orthostasis. Her systolic blood pressures on lying was 172 and on standing was 138, although  she does not ambulate as she is wheelchair bound, so this is what I would consider as relative orthostasis as she is not ambulatory. She had no further episodes of presyncope. She was gently hydrated with IV fluids and remained stable. She is, therefore, being discharged home with close followup with her primary care physician as an outpatient. She also had 3 sets of cardiac markers checked which were negative, and she had no alarms on telemetry and no evidence of acute arrhythmias.  2. Acute bronchitis. The patient did have some minimal wheezing and bronchospasm. Therefore, she is presently being discharged on a prednisone taper and Zithromax for the next few days.  3. History of hypertension. The patient remained hemodynamically stable. As mentioned, she was noted to be orthostatic, but it was relative orthostasis given the fact that she is not ambulatory. Therefore, she will continue home meds, including Coreg, hydralazine, Imdur as stated.  4. Hypothyroidism. The patient was maintained on her Synthroid. She will resume that.  5. History of coronary artery disease. The patient had no evidence of any acute chest pain. She will continue her beta blockers and statin as stated.  6. Hyperlipidemia. The patient was maintained on her atorvastatin. She will resume that.  7. History of glaucoma. The patient was maintained on her latanoprost. She will also resume that.   CODE STATUS: The patient is a FULL CODE.   DISPOSITION: She is being discharged home with home health nursing services.   TIME SPENT ON DISCHARGE: 40 minutes.    ____________________________ Rolly Pancake. Cherlynn Kaiser, MD vjs:gb D: 09/12/2012 16:45:53 ET T:  09/13/2012 02:18:45 ET JOB#: [id number]  cc: Rolly PancakeVivek J. Cherlynn KaiserSainani, MD, <Dictator> Marisue IvanKanhka Linthavong, MD Houston SirenVIVEK J SAINANI MD ELECTRONICALLY SIGNED 09/13/2012 15:34

## 2014-12-21 NOTE — Consult Note (Signed)
Brief Consult Note: Diagnosis: Rectal bleeding.   Patient was seen by consultant.   Consult note dictated.   Comments: Appreciate consult for 81 y/o caucasian woman admitted with rectal bleeding for evaluation of the same. Daughter with her. They report 2 brown stools with blood yesterday and  2 stools today but cannot describe characteristics. Do report a history of constipation and straining with bms.  They deny abdominal pain, NVD, heartburn/reflux, and further GI complaints.  Per nursing staff, had a brown snf burgundy stool this am and more recently at 1330 had a burgundy stool that was pasty.  This was about 2h ago. Do note a drop in hgb from 11 to 8.8. On a Pantoprazole gtt.  Somewhat hypertensive.  Having some right shoulder/chest burning that is currently being evaluated as well.  Abdomen benign on exam.  Rectal exam with thin marroon material and foul smell. She is hemodynamically stable. Impression and plan: rectal bleeding: concern for diverticular bleed. Stat hgb. May need transfusion.  This may improve her chest discomfort. Would recommend stat GIB scan if signifcant amt of rectal bleeding happens again with vascular consult if positive, transfusion/volume replacment prn. Discussed with Dr Burnadette PopLinthavong. Troponins and EKG pending..  Electronic Signatures: Keturah BarreLondon, Avianah Pellman H (NP)  (Signed 02-Dec-14 15:33)  Authored: Brief Consult Note   Last Updated: 02-Dec-14 15:33 by Keturah BarreLondon, Dorothyann Mourer H (NP)

## 2014-12-21 NOTE — Consult Note (Signed)
PATIENT NAME:  Melissa Bright, Melissa Bright MR#:  [id number] DATE OF BIRTH:  [birth year]  DATE OF CONSULTATION:  08/01/2013  REFERRING PHYSICIAN:  Katha Hamming, MD CONSULTING PHYSICIAN:  Keturah Barre, NP  REASON FOR CONSULTATION: GI consult ordered by Dr. Luberta Mutter to evaluate for black stools.   HISTORY OF PRESENT ILLNESS: Appreciate consult for 81 year old Caucasian woman admitted with rectal bleeding for evaluation of same. Daughter with her. They report 2 brown stools with blood yesterday and 2 stools today, but cannot describe characteristics. Do report a history of constipation and straining with bowel movements. They deny abdominal pain, nausea, vomiting, diarrhea, heartburn, reflux, further GI complaints. Per nursing staff, the patient has had a brown soft burgundy stool this morning and more recently at 1:30. Had a burgundy stool that was pasty about 2 hours ago. Do note a drop in hemoglobin from 11 to 8.8. She is on a pantoprazole drip. She is somewhat hypertensive. She has also been having some right shoulder and chest burning that is currently being evaluated as well by her primary physician. She is hemodynamically stable. Has had colonoscopy in the past. Unable to remember results at this moment.   PAST MEDICAL HISTORY:  1.  MI, most recently May 2014, was transferred to [facility] for evaluation of this and was to have medical management.  2.  Bilateral carotid artery stenosis on the right and left side. 3.  CAD with 3 stents. 4.  Chronic systolic failure, EF 40%.  5.  GI bleeding in the past.  6.  Hypothyroidism.  7.  B12 deficiency. 8.  History of stroke. 9.  Bilateral knee surgeries. 10.  Hysterectomy. 11.  Kidney stent.   SOCIAL HISTORY: Lives at home. The patient's family cares for her. No smoking, tobacco, illicits.   FAMILY HISTORY: Significant for diabetes.   ALLERGIES: MACROBID.   HOME MEDICATIONS: ASA 81 mg p.o. daily, atorvastatin 40 mg p.o. daily, Plavix 75 mg p.o.  daily (this has been held), Colace 100 mg p.o. daily, ferrous sulfate 325 p.o. daily, hydralazine 50 mg p.o. q.i.d., Imdur 30 mg p.o. daily, latanoprost to each eye once a day at bedtime, levothyroxine 125 mcg p.o. daily pantoprazole 40 mg p.o. daily, sodium bicarbonate 650 p.o. b.i.d., vitamin B12 at 1000 mcg daily.  REVIEW OF SYSTEMS: Ten systems reviewed. Significant for fatigue, ecchymosis, chest pain as noted above, pedal edema, some confusion at times.   LABORATORY DATA:  Most recent: Glucose 111, BUN 38, creatinine 1.56, sodium 143, potassium 4.5, GFR 30, calcium 9, total protein 6.5, albumin 3.0, total bilirubin 0.3, alkaline phosphatase 114, AST 19, ALT 20. Troponin less than 0.02. WBC 5.1, hemoglobin 8.8, hematocrit 26.9, platelet count 169, red cells normocytic.   PHYSICAL EXAMINATION: VITAL SIGNS: Most recent: Temperature 98, pulse 94, respiratory rate 18, blood pressure 129/69, SaO2 of 97% on room air.  GENERAL: Caucasian elderly woman resting in bed in no acute distress, appears fatigued.  HEENT: Normocephalic, atraumatic. Mucous membranes pink, moist. Sclerae anicteric.  NECK: Supple. No JVD, lymphadenopathy.  RESPIRATORY: Respirations eupneic. Lungs clear.  CARDIAC: S1, S2, RRR. No MRG.  ABDOMEN: Plump abdomen, soft. Bowel sounds x 4. Nondistended, nontender. No guarding, rigidity, peritoneal signs, hepatosplenomegaly, masses.  SKIN: Warm, dry, pale pink. Multiple ecchymotic areas all over body.  EXTREMITIES: Generalized weakness. MAEW x 4. Mild lower extremity edema. No cyanosis.  NEUROLOGIC: Alert, oriented x 3. Cranial nerves II through XII intact. Speech clear.  PSYCHIATRIC: Pleasant, somewhat flat, calm, cooperative.  RECTAL: Maroon, thin material on  glove. Nontender. No other abnormalities.   IMPRESSION AND PLAN: Rectal bleeding. Concern for diverticular bleed. Stat hemoglobin. May need transfusion. This may also improve her chest discomfort. Would recommend stat GI bleeding  scan if significant amount of rectal bleeding happens again, with vascular consult if positive. Also recommend transfusion/volume replacement p.r.n. Discuss with Dr. Burnadette PopLinthavong. Troponins and EKG pending.   These services provided by Vevelyn Pathristiane Rajan Burgard, MSN, [facility], in collaboration with Christena DeemMartin U. Skulskie, MD, with whom I have discussed this patient in full.   Thank you very much for this consult.   ____________________________ Keturah Barrehristiane H. Jvon Meroney, NP chl:jcm D: 08/01/2013 18:33:53 ET T: 08/01/2013 18:54:51 ET JOB#: [id number]  cc: Keturah Barrehristiane H. Joannie Medine, NP, <Dictator> Eustaquio MaizeHRISTIANE H Hulen Mandler FNP ELECTRONICALLY SIGNED 08/07/2013 13:07

## 2014-12-21 NOTE — H&P (Signed)
PATIENT NAME:  Melissa Bright, Melissa Bright MR#:  [id number] DATE OF BIRTH:  Jan 19, 1924  DATE OF ADMISSION:  01/07/2013  PRIMARY CARE PHYSICIAN:  Dr. Burnadette PopLinthavong.  REFERRING PHYSICIAN:  Dr. Manson PasseyBrown  CHIEF COMPLAINT:  Syncope.   HISTORY OF PRESENT ILLNESS:  The patient is a 81 year old female with a past medical history of cardiomyopathy, coronary artery disease, congestive heart failure and bilateral carotid artery disease with a right-sided stenosis of 75% to 95%, was sent over to the ER by EMS after she was passed out. According to the doctor, the patient is mostly bed-bound and today while the patient was on a potty seat, suddenly she became pale and she passed out. She did not fall and did not sustain any injuries. According to doctor, the patient was opening her eyes just prior to EMS arriving. When the patient was sent over to the ER by EMS, her heart rate was running low with a sinus bradycardia of 40 beats per minute. The patient has received 1 dose of atropine by the EMS and she was brought into the ER. In the ER, a CAT scan of the head was done which was negative. The patient's hemoglobin dropped from 12 to 10 and a stool for Hemoccult was done x 1 by the ER physician which showed it was positive. Now the patient's creatinine is a 1.90, which is medically indicating that the patient is dehydrated and her renal function is worse because of the dehydration. The denies any complaints during my examination.  PAST MEDICAL HISTORY:  A history of bilateral carotid artery disease, at the right side 75% to 95% stenosis, on the left side 50% to 75% stenosis, congestive heart failure, systolic, chronic, cardiomyopathy with an ejection fraction of 40%. Also the patient had a GI bleed in the past, hypothyroidism, vitamin B12 deficiency.  PAST SURGICAL HISTORY:  Bilateral knee arthroplasty, hysterectomy and kidney stent.  SOCIAL HISTORY:  Lives at home. One of her sons lives with her, and daughter takes care of her, who  lives close by. Denies any smoking history, alcohol or illicit drug abuse.   FAMILY HISTORY:  No history of coronary artery disease, stroke or diabetes.   ALLERGIES:  MACROBID. MEDICATIONS:  Vitamin B12 one 500 mg on daily basis, sodium bicarbonate 650 mg one tablet p.o. 2 times a day, omeprazole 20 mg p.o. once daily, Levothyroxine 112 mcg once daily, Imdur 60 mg one tablet once a day, hydralazine 50 mg 4 times a day, Colace 100 mg 2 times a day, Coreg 6.25 mg, amlodipine 10 mg p.o. once daily, Aggrenox 25/200 one capsule 2 times a day, atorvastatin 40 mg once daily,   REVIEW OF SYSTEMS:  CONSTITUTIONAL:  Denies any fever,weakness and weight loss.  HEENT: Denies any headache. No blurry vision. No pain or redness in the eyes, denies any cataracts. ENT:  Denies any tinnitus, epistaxis, nasal discharge, or difficulty in swallowing.  RESPIRATORY:  No coughing, denies any hemoptysis or dyspnea  CARDIOVASCULAR:  Denies chest pains, orthopnea, palpitations. GASTROINTESTINAL:  No nausea, vomiting, diarrhea. No hematemesis.   GENITOURINARY:  No dysuria, no hematuria.  GYNECOLOGY:  No breast masses, vaginal discharge.  ENDOCRINE:  No polydipsia, nocturia, thyroid problems.   HEMATOLOGIC AND LYMPHATIC:  No anemia, easy bruising, or bleeding.  INTEGUMENTARY:   MUSCULOSKELETAL:  No joint pain in neck, back, shoulder. Denies any gout but had history of psoriasis.  PSYCHIATRY:  No anxiety, Schizophrenia and PTSD  PHYSICAL EXAMINATION: VITAL SIGNS:  Temperature 98.4, pulse 84, respiratory rate  20, blood pressure 173/88, pulse ox 96%.   GENERAL:  Not in acute distress, moderately built and obese.  HEENT:  Normocephalic, atraumatic. Pupils are equal and reactive to light and accommodation. No scleral icterus, no conjunctivitis. Extraocular movements are intact. No sinus tenderness, no postnasal drip. No pharyngeal exudate.  NECK:  Supple. No JVD, no thyromegaly, no lymphadenopathy.   LUNGS:  Clear to  auscultation bilaterally. No accessory muscle use. No chest wall tenderness on palpation.  CARDIOVASCULAR:  S1, S2 normal, regular rate and rhythm. No murmurs, no gallops.  GASTROINTESTINAL:  Soft. Bowel sounds present in all 4 quadrants. Nontender, nondistended. No masses felt. No hepatosplenomegaly. NEUROLOGICAL:  Alert and oriented x 3. Motor and sensory are grossly intact. Cranial nerves II through are grossly intact. Reflexes artery 2+ EXTREMITIES:  Trace edema is present. No cyanosis, no clubbing. MUSCULOSKELETAL:  No joint effusion, tenderness or erythema.  SKIN:  Normal turgor, warm to touch, no rashes, no lesions.  PSYCHIATRIC:  Normal mood and affect.    LABORATORY AND IMAGING STUDIES:  EKG shows normal sinus rhythm at 60 beats per minute, normal P-R and QRS interval, prolonged QT and corrected QT interval, no ST-T wave changes.  CT head:  No acute findings. Urinalysis:  Hazy in appearance, greater than 500 protein , no yeast. Troponin is less than 0.02. WBC 6.8, hemoglobin 10.6, which has dropped from 12.0 in 08/2012, hematocrit 31.3, platelets are 154, glucose 175, BUN 42, creatinine 1.97, chloride 113, CO2 23, GFR 22, anion gap is 7. Stool for Hemoccult was positive x 1 in the ER.   ASSESSMENT AND PLAN:  An 81 year old female that was brought in to the Emergency Room after she had a syncopal episode and then was diagnosed with sinus bradycardia with a heart rate of 40 by the EMS and has atropine, one dose.  1.  Syncope. Probably vasovagal with a component of dehydration. Other differential cardiogenic syncope as the patient was bradycardic. The plan is to admit her to telemetry. CT head is negative. Carotid Dopplers were done just in 08/2012 which showed right carotid stenosis of 75% to 95%, but according to the discharge summary, the patient is not a surgical candidate. We will obtain 2-D echocardiogram at this time. We will check orthostatics and neurologic checks. Cardiology consult is  placed to Dr. Darrold Junker.   2.  Acute on chronic kidney injury. We will avoid nephrotoxins for the time being. We will provide her gentle hydration with intravenous fluids. We will closely monitor for symptoms and signs of fluid overload as the patient has a history of congestive heart failure which is systolic in nature with cardiomyopathy.  3.  Coronary artery disease. Resume home medications.  4.  Congestive heart failure. Stable, no fluid overload at this time. 5.  Cardiomyopathy. Resume her home medications. We will provide her gastrointestinal and deep venous thrombosis prophylaxis.   CODE STATUS:  DO NOT RESUSCITATE. Daughter is Cytogeneticist.  TOTAL TIME SPENT ON AND AMDISSION:  50 minutes.   The patient will be transferred to Dr. Burnadette Pop from [facility] in the morning.   ____________________________ Ramonita Lab, MD ag:jm D: 01/07/2013 02:14:42 ET T: 01/07/2013 11:31:14 ET JOB#: [id number]  cc: Ramonita Lab, MD, <Dictator> Marcina Millard, MD Ramonita Lab MD ELECTRONICALLY SIGNED 01/16/2013 22:41

## 2014-12-21 NOTE — Consult Note (Signed)
Chief Complaint:  Subjective/Chief Complaint seen for lower GI bleeding.   no bleeding overnight, but 2 episodes earlier today.  denies nausea or abdominal pain.  patient disoriented as to place.   VITAL SIGNS/ANCILLARY NOTES: **Vital Signs.:   03-Dec-14 07:51  Systolic BP Systolic BP 145  Diastolic BP (mmHg) Diastolic BP (mmHg) 71  Mean BP 95  BP Source  if not from Vital Sign Device manual  Telemetry pattern Cardiac Rhythm Normal sinus rhythm; pattern reported by Telemetry Clerk; HR 89 depressed ST  *Intake and Output.:   03-Dec-14 08:38  Stool  pt had a medium amount of maroon colored loose stool. RN was notified.    13:44  Stool  pt. had a large bm   Brief Assessment:  Cardiac Regular   Respiratory clear BS   Gastrointestinal details normal Soft  Nontender  Nondistended  No masses palpable  Bowel sounds normal   Additional Physical Exam DRE-dark gray, brightly heme positive, soft, unformed   Lab Results: Routine Chem:  01-Dec-14 12:41   BUN  43  02-Dec-14 05:54   BUN  38  03-Dec-14 06:23   BUN  32  Routine Hem:  01-Dec-14 12:41   Hemoglobin (CBC)  11.1  02-Dec-14 05:54   Hemoglobin (CBC)  8.8    15:43   Hemoglobin (CBC)  8.8 (Result(s) reported on 01 Aug 2013 at 04:16PM.)    22:18   Hemoglobin (CBC)  8.2 (Result(s) reported on 01 Aug 2013 at 10:38PM.)  03-Dec-14 06:23   Hemoglobin (CBC)  9.3   Assessment/Plan:  Assessment/Plan:  Assessment 1) hematochezia-2 loose bloody stools since this am, no abdominal pain.   2) disorientation.  3) multiple medical problems including CAD with coronary stents, MI, TIA, on plavix and asa as outpatient.   Plan 1) stat hgb, stat vitals. further recs to follow.   Electronic Signatures: Barnetta ChapelSkulskie, Nedda Gains (MD)  (Signed 03-Dec-14 17:09)  Authored: Chief Complaint, VITAL SIGNS/ANCILLARY NOTES, Brief Assessment, Lab Results, Assessment/Plan   Last Updated: 03-Dec-14 17:09 by Barnetta ChapelSkulskie, Crystalynn Mcinerney (MD)

## 2014-12-21 NOTE — Consult Note (Signed)
Chief Complaint:  Subjective/Chief Complaint repeat hgb 8.5, in line with those done yesterday.  bm likley clearing old stool.  continue close observation.  If there is a stool of maroon color, or tarry, would do stat bleeding scan.   VITAL SIGNS/ANCILLARY NOTES: **Vital Signs.:   03-Dec-14 17:12  Temperature Temperature (F) 98.5  Celsius 36.9  Temperature Source oral  Pulse Pulse 89  Respirations Respirations 17  Systolic BP Systolic BP 169  Diastolic BP (mmHg) Diastolic BP (mmHg) 68  Mean BP 101  Pulse Ox % Pulse Ox % 94  Pulse Ox Activity Level  At rest  Oxygen Delivery Room Air/ 21 %   Electronic Signatures: Barnetta ChapelSkulskie, Martin (MD)  (Signed 03-Dec-14 17:47)  Authored: Chief Complaint, VITAL SIGNS/ANCILLARY NOTES   Last Updated: 03-Dec-14 17:47 by Barnetta ChapelSkulskie, Martin (MD)

## 2014-12-21 NOTE — Discharge Summary (Signed)
PATIENT NAME:  Melissa FairyFULLER, Raedyn E MR#:  [id number] DATE OF BIRTH:  06-25-1924  DATE OF ADMISSION:  07/31/2013 DATE OF DISCHARGE:  08/04/2013  DISCHARGE DIAGNOSES: 1.  Acute gastrointestinal bleed.  2.  Anemia.  3.  Hypertension.  4.  Chronic renal failure.  5.  Hypothyroidism.   DISCHARGE MEDICATIONS: 1.  Sodium bicarbonate 650 mg p.o. b.i.d.  2.  Atorvastatin 40 mg p.o. at bedtime.  3.  Docusate sodium 100 mg p.o. b.i.d.  4.  Imdur 30 mg p.o. daily.  5.  Levothyroxine 125 mcg p.o. daily.  6.  Vitamin B12 1000 mcg daily.  7.  Latanoprost ophthalmic 0.005% one drop each eye at bedtime.  8.  Acetaminophen 325 mg 2 tabs p.o. every 4 hours as needed for pain and fever.  9.  Coreg 6.25 mg p.o. b.i.d.  10.  Pantoprazole 40 mg p.o. b.i.d.   MEDICATIONS TO HOLD: Aspirin and Plavix.   CONSULTS: GI per Dr. Marva PandaSkulskie.   PROCEDURES: None.   PERTINENT LABS ON DAY OF DISCHARGE: Sodium 143, potassium 3.7, and creatinine 1.61. White blood cell count 5.4, hemoglobin 8.9, and platelets 165. Urinalysis was negative.   BRIEF HOSPITAL COURSE:  1.  Acute GI bleed. The patient initially came in with a history of GI bleed. Hemoglobin dropped from 11.1 to 8.8 within 24 hours. During her hospital stay she had minimal acute bleeding. Her hemoglobin remained stable and on day of discharge was 8.9. She was evaluated by GI who did not perform any procedures. At this time, her Plavix and aspirin were held and will continue to be held until followup with Dr. Burnadette PopLinthavong in 1 week to reassess her hemoglobin, follow up with Dr. Marva PandaSkulskie in 2 weeks to reassess her clinical situation.  2.  Hypertension. The patient's blood pressure fluctuated throughout the hospital stay. She was started on Coreg for cardiac protection and also for blood pressure control. She tolerated this medication well. She will continue on that medication.  3.  Other chronic issues, stable.   DISPOSITION: She is in stable condition to be discharged  to home. She is a DNR/DNI and needs 24-hour assistance. Family has conveyed that they can provide this for her. Again, follow up with Dr. Burnadette PopLinthavong in 1 week and follow up with Dr. Marva PandaSkulskie in 2 weeks.  ____________________________ Marisue IvanKanhka Rogers Ditter, MD kl:sb D: 08/04/2013 07:49:33 ET T: 08/04/2013 08:02:20 ET JOB#: [id number]  cc: Marisue IvanKanhka Hailea Eaglin, MD, <Dictator> Marisue IvanKANHKA Taimi Towe MD ELECTRONICALLY SIGNED 08/07/2013 9:58

## 2014-12-21 NOTE — H&P (Signed)
PATIENT NAME:  Melissa Bright, Zynasia E MR#:  [id number] DATE OF BIRTH:  1923/09/20  DATE OF ADMISSION:  07/31/2013  PRIMARY DOCTOR:  Marisue IvanKanhka Linthavong, MD  ER PHYSICIAN: Kathreen DevoidKevin A. Paduchowski, MD  CHIEF COMPLAINT: Rectal bleeding.   HISTORY OF PRESENT ILLNESS: The patient is an 81 year old female patient with history of hypertension, coronary artery disease, history of multiple stents in the heart, hypothyroidism, came in because of rectal bleeding. The patient was brought in by the daughter. Daughter says that this morning the patient had an episode of black stool  from rectum, later on some black stool. The patient did not have any stomach pain and did not have any problems like this before. The patient denies any nausea or vomiting and no diarrhea. The patient started to have this episode of black bowel movement this afternoon and the patient noted to be weak.   PAST MEDICAL HISTORY:  Significant for bilateral carotid artery disease with 95% stenosis on the right side and left side she had 50% to 70% stenosis, history of coronary artery disease with 3 stents, history of chronic systolic heart failure with EF 40%. History of GI bleeds in the past, hypothyroidism and vitamin B12 deficiency. The patient's daughter unable to ell me how many stents she had but she thinks she had 3 stents. She also has a history of stroke before.    PAST SURGICAL HISTORY: Significant for bilateral knee surgeries and hysterectomy, kidney stent and she had coronary artery disease with 1 or 2 stents.    SOCIAL HISTORY: Lives at home. The patient's daughter and son take care of her. The patient has 3 kids who takes care of her around the clock. The patient has no smoking, no drugs.  The patient can transfer back from bed to recliner.   FAMILY HISTORY: No history of coronary artery disease but 2 of the sisters had diabetes.   ALLERGIES: MACROBID.   MEDICATIONS:  1.  Aspirin 81 mg daily.  2.  Atorvastatin 40 mg p.o. daily.  3.   Plavix 75 mg p.o. daily.  4.  Colace 100 mg p.o. daily.  5.  Ferrous sulfate 325 mg p.o. daily.  6.  Hydralazine 50 mg p.o. 4 times a day as needed.  7.  Imdur 30 mg p.o. daily.  8.  Latanoprost 0.005% solution in each eye once a day at bedtime.  9.  Levothyroxine 125 mg p.o. daily.  10.  Pantoprazole 40 mg p.o. daily.  11.  Sodium bicarbonate 650 mg p.o. b.i.d.  12.  Vitamin B 1000 mcg p.o. daily.   REVIEW OF SYSTEMS: CONSTITUTIONAL: The patient has no fever, had some fatigue.  EYES: No blurred vision.  ENT: No tinnitus. No ear pain, hard of hearing. No epistaxis. No difficulty swallowing.  RESPIRATORY: No cough. No wheezing.  CARDIOVASCULAR: No orthopnea. The patient has no chest pain. Does have pedal edema. No palpitations. No syncope.  GASTROINTESTINAL: No nausea. No vomiting. No diarrhea. No abdominal pain. Noted to have some melena today and rectal bleeding.  GENITOURINARY: No dysuria.  ENDOCRINE: No polyuria or nocturia.  INTEGUMENTARY: No skin rashes. The patient's daughter says that she bruises very easily with aspirin and Plavix.  MUSCULOSKELETAL: No joint pains.  NEUROLOGIC: Had strokes before.  PSYCHIATRIC: No anxiety or insomnia.   PHYSICAL EXAMINATION:  VITAL SIGNS:  Temperature 98.4, heart rate 104, blood pressure 131/75, sats 96% on room air.  GENERAL: She is alert, awake, oriented, answering questions appropriately.  HEENT: Head normocephalic, atraumatic. Pupils equally  reacting to light. Extraocular movements are intact. Nose: No nasal lesions. No drainage. Ears: No drainage. No lesions. Mouth: No lesions.   NECK: Supple. No masses. Thyroid is in the midline, not enlarged. No JVD.  RESPIRATORY: Good respiratory effort. Clear to auscultation. No wheeze. No rales.  CARDIOVASCULAR: S1, S2 regular. No murmurs.  GASTROINTESTINAL: Abdomen is nontender, nondistended. Bowel sounds present. No organomegaly. No hernias. Guaiac-positive stools, the patient's guaiac test was  done by ER MD. SKIN: Warm and dry. No cyanosis. No skin rashes.  EXTREMITIES: The patient does have 1+ pitting edema bilaterally.  NEUROLOGIC: Alert, awake, oriented. Cranial nerves II to XII normal.  power 4/4  upper  and > lower extremities. Sensations are intact.  DTRs 2+ bilaterally.  PSYCHIATRIC: Mood and affect are within normal limits.   LABORATORY DATA:  1.  WBC 7.2, hemoglobin 11.1, hematocrit 33.8, platelets 215.  2.  Electrolytes: Sodium 141, potassium 4.8, chloride 108, bicarb 28, BUN 43, creatinine is 1.65, glucose 191.  3.  LFTs within normal limits.  4.  Troponin less than 0.02.  5.  The patient's EKG shows normal sinus rhythm at 88 beats per minute.   ASSESSMENT AND PLAN:  1.  The patient is an 81 year old female with rectal bleeding. According to the daughter, only minimal rectal bleeding but she had a guaiac-positive stool in the ER so we are admitting for upper gastrointestinal bleed and start her on IV Protonix drip, check CBC q.8 hours. Obtain gastroenterology consult and hold aspirin and Plavix.  2.  Gastrointestinal bleed probably secondary to gastritis from aspirin and Plavix so hold them and continue proton pump inhibitors.  The patient's daughter mentioned that her last stent was  a long time back. She was discharged to [facility] in May for non-ST-elevation myocardial infarction. At that time, daughter said she did not have any stents so we are going to hold aspirin and Plavix. Continue monitoring closely. The patient's stent to her heart was about 3 o4 4 years ago.  3.  The patient's other diagnoses include chronic renal failure which is chronic, no acute component.   4.  Chronic systolic heart failure, stable at this time.  5.  Hypothyroidism. Continue Synthroid.  6.  Uncontrolled hypertension. The patient's blood pressure is elevated to 218/75 during my visit, heart rate 88. We can use hydralazine 20 mg IV q.6 hours p.r.n. for systolic pressure more than 160/90.  7.   History of coronary artery disease with stents before.  The patient's aspirin and Plavix are stopped due to gastrointestinal bleed. Continue atorvastatin and Imdur.  8.  CODE STATUS: Full code.   TIME SPENT:  About 60 minutes.    ____________________________ Katha Hamming, MD sk:cs D: 07/31/2013 20:02:49 ET T: 07/31/2013 20:18:04 ET JOB#: [id number]  cc: Katha Hamming, MD, <Dictator> Katha Hamming MD ELECTRONICALLY SIGNED 08/09/2013 22:59

## 2014-12-21 NOTE — Discharge Summary (Signed)
PATIENT NAME:  Melissa Bright, Melissa Bright MR#:  [id number] DATE OF BIRTH:  1924/07/15  DATE OF ADMISSION:  01/07/2013 DATE OF DISCHARGE:    Addendum  The patient stayed an extra 18 hours.  As her renal function improved, she had no more evidence of hypotension or bradycardia and is stable for discharge this morning.    ____________________________ Danella PentonMark F. Keynan Heffern, MD mfm:ea D: 01/08/2013 08:08:21 ET T: 01/08/2013 20:47:40 ET JOB#: [id number]  cc: Danella PentonMark F. Josiyah Tozzi, MD, <Dictator> Karlen Barbar Sherlene ShamsF Margene Cherian MD ELECTRONICALLY SIGNED 01/09/2013 8:20

## 2014-12-21 NOTE — Discharge Summary (Signed)
PATIENT NAME:  Melissa Bright, Melissa Bright MR#:  [id number] DATE OF BIRTH:  03-26-1924  DATE OF ADMISSION:  01/07/2013  DISCHARGE DIAGNOSES: 1.  Vasovagal syncope.  2.  High grade carotid stenosis.  3.  Hyperlipidemia.  4.  Hypothyroidism.  5.  Hypertension. 6.  Upper gastrointestinal bleed.  7.  B12 deficiency.   DISCHARGE MEDICATIONS:  Sodium bicarbonate 650 mg b.i.d., Aggrenox 25/200, 1 b.i.d., amlodipine 10 mg daily, Synthroid 112 mcg daily, Lipitor 40 mg at bedtime, Colace 100 mg b.i.d., Imdur 60 mg daily, B12 daily, iron tab daily, pantoprazole 40 mg daily, hydralazine 25 mg t.i.d.   REASON FOR ADMISSION:  An 81 year old female presents with syncope. Please see H and P for HPI, past medical history, physical exam.   HOSPITAL COURSE: The patient was admitted. Cardiac enzymes normal. Her hemoglobin over the last 2 to 3 months dropped from 12 to 6. She is slightly heme positive. Her syncope occurred while sitting  down on the commode. With her high grade carotid stenosis and nonsurgical candidacy, I gave the patient and her daughter  the option of moving toward carotid stenting versus lowering her blood pressure medicine and allowing the systolic to run in the high 150s to lower 160s. They opted for less blood pressure medicine, and thus Coreg was discontinued and hydralazine dose lowered to minimize gastric blood loss. Omeprazole was replaced with Protonix 40 mg daily. She will continue on her iron pill and follow up with Dr. Burnadette PopLinthavong in one week.    ____________________________ Danella PentonMark F. Miller, MD mfm:mr D: 01/07/2013 09:00:57 ET T: 01/07/2013 22:25:24 ET JOB#: [id number]  cc: Danella PentonMark F. Miller, MD, <Dictator> MARK Sherlene ShamsF MILLER MD ELECTRONICALLY SIGNED 01/09/2013 8:20

## 2014-12-21 NOTE — Consult Note (Signed)
PATIENT NAME:  Melissa Bright, Melissa Bright MR#:  [id number] DATE OF BIRTH:  [birth year]  DATE OF CONSULTATION:  01/07/2013  REFERRING PHYSICIAN:  Dr. Amado Coe.  CONSULTING PHYSICIAN:  Lamar Blinks, MD  REASON FOR CONSULTATION:  Syncope with bradycardia, a previous stroke, carotid atherosclerosis, hypertension, and coronary artery disease.   CHIEF COMPLAINT:  "The patient passed out."   HISTORY OF PRESENT ILLNESS:  This is an 81 year old female with known cardiovascular disease and coronary disease status post previous myocardial infarction and previous stents. The patient had had hyperlipidemia and hypertension on appropriate medications although has had some bradycardia, some shortness of breath. The patient then had an episode of which she was on the toilet and had weakness and syncope and did not come to very quickly. She did, hemodynamically, stay stable and was admitted to the hospital. At that time she had an EKG showing sinus bradycardia with nonspecific ST changes and no elevation of troponin. The patient has had no further episodes of syncope since. The patient does have valvular heart disease with a murmur today and will need further evaluation. Telemetry does show sinus bradycardia and it is likely that the patient did have significant symptoms likely due to hypotension and bradycardia.   REVIEW OF SYSTEMS:  The remainder review of systems negative for vision change, ringing in the ears, hearing loss, cough, congestion, heartburn, nausea, vomiting, diarrhea, bloody stools, stomach pain, extremity pain, leg weakness, cramping of the buttocks, known blood clots, headaches, nosebleeds, congestion, trouble swallowing, frequent urination, urination at night, muscle weakness, numbness, anxiety, depression, skin lesions or skin rashes.   PAST MEDICAL HISTORY:  1.  Coronary artery disease with stenting.  2.  Hyperlipidemia.  3.  Hypertension.  4.  Old stroke.  5.  Bradycardia. 6.  Valvular heart disease.   7.  Carotid atherosclerosis.   FAMILY HISTORY:  No family members with early onset of cardiovascular disease or hypertension.   SOCIAL HISTORY:  Currently denies alcohol or tobacco use.   ALLERGIES:  As listed.   MEDICATIONS:  As listed.   PHYSICAL EXAMINATION:  VITAL SIGNS:  Blood pressure is 110/68 bilaterally, heart rate 60 upright, reclining, and regular.  GENERAL:  She is a well appearing female in no acute distress.  HEENT:  No icterus, thyromegaly, ulcers, hemorrhage, or xanthelasma.  CARDIOVASCULAR:  Regular rate and rhythm. Normal S1 and S2 with a 2/6 apical and right upper sternal border murmur consistent with aortic valve stenosis and/or mitral regurgitation. LUNGS:  With few basilar crackles with normal respirations.  ABDOMEN:  Soft, nontender, without hepatosplenomegaly or masses. Abdominal aorta is normal size without bruit.  EXTREMITIES:  Shows 2+ bilateral pulses in dorsal, pedal, radial, and femoral arteries without lower extremity edema, cyanosis, clubbing, or ulcers.  NEUROLOGIC:   She is oriented to time, place, and person with normal mood and affect.   ASSESSMENT:  An 81 year old female with hypertension, hyperlipidemia, carotid atherosclerosis, coronary artery disease with acute onset of syncope, most consistent with medication management bradycardia and hypotension without evidence of congestive heart failure and/or myocardial infarction.   RECOMMENDATIONS:  1.  Decrease or discontinue carvedilol due to concerns of bradycardia. 2.  Discontinuation of hydralazine due to of hypotension. 3   Further ambulation and follow for heart rate and blood pressure control and further adjustments of her other medications.  4.  Continue aspirin for further risk reduction of carotid atherosclerosis, stroke and coronary artery disease.  5.  Echocardiogram for left ventricular systolic dysfunction, valvular heart disease contributing  to current issues.  6.  Consider carotid Doppler  for reassessment of extensive carotid atherosclerosis contributing to current episodes of syncope. 7.  No further cardiac diagnostics at this time.   ____________________________ Lamar BlinksBruce J. Kowalski, MD bjk:jm D: 01/07/2013 11:39:07 ET T: 01/07/2013 17:17:34 ET JOB#: [id number]  cc: Lamar BlinksBruce J. Kowalski, MD, <Dictator> Lamar BlinksBRUCE J KOWALSKI MD ELECTRONICALLY SIGNED 01/09/2013 13:47

## 2014-12-22 NOTE — Discharge Summary (Signed)
PATIENT NAME:  Melissa Bright, Melissa Bright MR#:  [id number] DATE OF BIRTH:  1923/11/24  DATE OF ADMISSION:  07/14/2014 DATE OF DISCHARGE:  07/15/2014  PRIMARY CARE PHYSICIAN: Dion Body, MD  DISCHARGE DIAGNOSES:  1.  Syncope.  2.  Anemia.   DISCHARGE MEDICATIONS: These are the same as her admission medications and should include:  1.  Sodium bicarbonate 650 mg b.i.d.  2.  Atorvastatin 40 mg at bedtime.  3.  Docusate 100 mg b.i.d.  4.  Imdur 30 mg daily.  5.  Vitamin B12 at 1000 mcg daily.  6.  Coreg 6.25 mg b.i.d.  7.  Pantoprazole 40 mg b.i.d.  8.  Folic acid 0.4 mg daily.  9.  Aspirin 81 mg daily.  10.  Synthroid 112 mcg daily.  11.  Hydralazine 25 mg p.o. b.i.d. if systolic blood pressure greater than 709 and diastolic over 643.  12.  Nitroglycerin sublingual p.r.n. chest pain.  13.  Plavix 75 mg daily.  HISTORY OF PRESENT ILLNESS: This is an 81 year old female with a history of coronary disease, status post ST elevation MI in 10/2013, ischemic cardiomyopathy with EF 20-25%, stage III-IV chronic kidney disease, history of TIA, chronic anemia, who presented to the ER with her daughter after having had a syncopal episode at home. This occurred while the patient was using the commode. Daughter was a witness. Initial vital signs were normal other than an elevated blood pressure of 187/63. The patient was asymptomatic by the time she presented to the ED. Labs notable for hemoglobin of 7, hematocrit 23.4, elevated creatinine of 2.3. CT head showed no acute change. Chest x-ray showed low lung volumes, no cardiopulmonary disease. EKG showed normal sinus rhythm with a prolonged QT wave. QT waves in anterolateral leads were present. The patient was admitted for syncope attributed to a vasovagal response. See detailed H and P for further information.   HOSPITAL COURSE: The patient was admitted and placed on telemetry. Initial stool in the ER was guaiac-positive. Given her severe anemia, the patient was  consented and then given 1 unit packed red blood cell transfusion. She had no events on telemetry. Cardiac enzymes were negative. On the subsequent hospital day, hemoglobin improved to 8.3, hematocrit 25.9, and creatinine improved slightly to 2.1, EGFR 23. She remained asymptomatic. The cause of anemia felt to be occult GI blood loss. Discussed possibility of upper and lower endoscopies with the patient and her daughter. The patient stated she did not want any scopes at her advanced age. She is on home hospice. Daughter was in agreement.    PLAN: Discharge home to home hospice.   DISCHARGE INSTRUCTIONS:  1.  The patient instructed to follow up with her primary care provider, Dr. Netty Starring, in 2-3 weeks for repeat evaluation of severe anemia. 2.  Resume all outpatient medications.     ____________________________ A. Lavone Orn, MD ams:ts D: 07/15/2014 08:56:01 ET T: 07/15/2014 16:47:09 ET JOB#: [id number]  cc:  A. Lavone Orn, MD, <Dictator> Dion Body, MD Gracy Bruins Dejai Schubach MD ELECTRONICALLY SIGNED 07/17/2014 22:22

## 2014-12-22 NOTE — Discharge Summary (Signed)
PATIENT NAME:  Melissa Bright, Yesha E MR#:  [id number] DATE OF BIRTH:  1923/12/20  DATE OF ADMISSION:  11/04/2013 DATE OF DISCHARGE:  11/09/2013   DISCHARGE DIAGNOSES:  1. Acute myocardial infarction.  2. Systolic congestive heart failure with ejection fraction of 20% to 25%.  3. Anemia, status post transfusion of 2 units packed red blood cells.  4. Hypertension.  5. Hypothyroidism.  6. Hyperlipidemia.  7. Chronic kidney disease, with a baseline creatinine of 1.6.   DISCHARGE MEDICATIONS:  1. Sodium bicarbonate 650 mg p.o. b.i.d.  2. Atorvastatin 40 mg p.o. at bedtime.  3. Docusate 100 mg p.o. b.i.d.  4. Imdur 30 mg extended-release 1 tab daily.  5. Vitamin B12 1000 mcg daily.  6. Carvedilol 6.25 mg p.o. b.i.d.  7. Pantoprazole 40 mg p.o. b.i.d.  8. Folic acid 0.4 mg p.o. daily.  9. Astepro  205.5 mcg 2 sprays to each nostril 2 times a day.  10. Aspirin 81 mg p.o. daily. 11. Levothyroxine 112 mcg p.o. daily.  12. Hydralazine 50 mg 1/2 tab b.i.d. as needed if systolic blood pressure is greater than 180 systolic and 110 diastolic.  13. Nitroglycerin 0.4 mg 1 tablet sublingual q.5 minutes x3 doses as needed for chest pain.  14. Clopidogrel 75 mg p.o. daily.   CONSULTATIONS: Cardiology and palliative care.   PROCEDURES: None.   PERTINENT LABORATORIES AND STUDIES: On day of discharge, sodium 141, potassium 4.1, creatinine 1.78, glucose 102. White blood cell count 5.2, hemoglobin 11.3 and platelets of 204.   BRIEF HOSPITAL COURSE:  1. Acute myocardial infarction. The patient initially came in with acute chest with history of coronary artery disease, medically managed. Troponins were 5.85, greater than 40, greater than 40, and 18. She was placed on a heparin drip. Evaluated by cardiology. No catheterization performed given her age and her overall health status. Will plan for medical management. Her chest pain did resolve. She understands the severity of her heart disease. At this time, will  continue with medical management.  2. Acute anemia. The patient's hemoglobin dropped as low as 7.3, with unclear source of bleeding. She denied any bleeding during her hospital stay. She was given 2 units of packed red blood cells. Her hemoglobin bumped up to 10.7. On day of discharge, it was 11.3 with no further issues.  3. Systolic congestive heart failure, unchanged. The patient denies any worsening respiratory status or peripheral edema at this time. Will continue with her current regimen. Unfortunately, we are unable to place her on an ACE inhibitor because of her worsening renal function. We may decide to implement an ACE inhibitor as an outpatient pending her renal status.  4. Other issues are stable at this time.  5. She was evaluated by palliative care, who recommended that she go home with hospice care. Will set that up for her. Follow up: with me as an outpatient within 10 days to make sure that she has everything in place.   TIME SPENT: Please note that the total discharge time did take more than half an hour.  ____________________________ Marisue IvanKanhka Hedda Crumbley, MD kl:lb D: 11/09/2013 08:25:21 ET T: 11/09/2013 08:46:45 ET JOB#: [id number]  cc: Marisue IvanKanhka Keone Kamer, MD, <Dictator> Marisue IvanKANHKA Daevon Holdren MD ELECTRONICALLY SIGNED 11/28/2013 15:21

## 2014-12-22 NOTE — H&P (Signed)
PATIENT NAME:  Melissa Bright, Melissa Bright MR#:  [id number] DATE OF BIRTH:  05/23/1924  DATE OF ADMISSION:  11/04/2013  REFERRING PHYSICIAN: Su Ley, MD  FAMILY PHYSICIAN: Marisue Ivan, MD  REASON FOR ADMISSION: Acute non-ST elevation myocardial infarction.   HISTORY OF PRESENT ILLNESS: The patient is an 81 year old female who has a significant history of coronary artery disease status post PTCA with stent placements. Has had previous MIs with a known ischemic cardiomyopathy and chronic systolic congestive heart failure. Followed closely by Dr. Darrold Junker. Presents today with acute onset of shortness of breath and right shoulder pain. In the Emergency Room, the patient was noted to be in acute on chronic congestive heart failure with peripheral edema. Her troponin was elevated, consistent with a non-ST elevation MI. She is now admitted for further evaluation.   PAST MEDICAL HISTORY:  1. ASCVD, status post MI in December 2005.  2. Status post PTCA with stent placement x3.  3. Ischemic cardiomyopathy.  4. Chronic systolic congestive heart failure.  5. Benign hypertension.  6. Hyperlipidemia.  7. Chronic anemia.  8. Hypothyroidism.  9. Peripheral vascular disease, status post left renal artery stent placement.   10. History of TIAs.  11. B12 deficiency.  12. Recurrent UTIs.  13. Diverticulosis.  14. Osteoarthritis.  15. Status post hysterectomy.  16. Status post bilateral knee surgeries.  17. Left carotid stenosis.  18. Psoriasis.  19. Chronic renal insufficiency.  20. Glaucoma.   MEDICATIONS ON ADMISSION:  1. Hydralazine 25 mg p.o. b.i.d.  2. Protonix 40 mg p.o. b.i.d.  3. Folic acid 400 mcg p.o. daily.  4. B12 1000 mcg p.o. daily.  5. Coreg 6.25 mg p.o. b.i.d.  6. Aspirin 81 mg p.o. daily.  7. Synthroid 112 mcg p.o. daily.  8. Imdur 30 mg p.o. daily.  9. Vitamin D 2000 units p.o. daily.  10. Astepro 1 puff in each nostril b.i.d.  11. Lipitor 40 mg p.o. daily.  12.  Nitrostat 0.4 mg sublingually p.r.n. chest pain.   ALLERGIES: MACROBID.   SOCIAL HISTORY: The patient is widowed. No history of alcohol or tobacco abuse.   FAMILY HISTORY: Positive for coronary artery disease, but otherwise unremarkable.   REVIEW OF SYSTEMS:  CONSTITUTIONAL: No fever or change in weight.  EYES: No blurred or double vision. Does have glaucoma.  ENT: No tinnitus or hearing loss. No nasal discharge or bleeding. No difficulty swallowing.  RESPIRATORY: No cough or wheezing. No hemoptysis.  CARDIOVASCULAR: No chest pain. No syncope.  GASTROINTESTINAL: No nausea, vomiting or diarrhea. No abdominal pain.  GENITOURINARY: No dysuria or hematuria. No incontinence.  ENDOCRINE: No polyuria or polydipsia. No heat or cold intolerance.  HEMATOLOGIC: The patient admits to anemia, but denies easy bruising or bleeding.  LYMPHATIC: No swollen glands.  MUSCULOSKELETAL: The patient denies pain in her neck, back, knees or hips. No gout.  NEUROLOGIC: No numbness or migraines. Denies seizures.  PSYCHIATRIC: The patient denies anxiety, insomnia or depression.   PHYSICAL EXAMINATION:  GENERAL: The patient is in no acute distress.  VITAL SIGNS: Currently remarkable for a blood pressure of 148/93, heart rate of 104, respiratory rate of 20, temperature of 98.9, saturation 94% on room air.  HEENT: Normocephalic, atraumatic. Pupils equally round and reactive to light and accommodation. Extraocular movements are intact. Sclerae are nonicteric. Conjunctivae are clear. Oropharynx is clear.  NECK: Supple, without JVD. No adenopathy or thyromegaly is noted.  LUNGS: Reveal basilar rales bilaterally. No wheezes or rhonchi. No dullness. Respiratory effort is mildly increased.  CARDIAC: Rapid rate with a regular rhythm. Normal S1 and S2. No significant rubs, murmurs or gallops. PMI is laterally displaced. Chest wall is nontender.  ABDOMEN: Soft, nontender, with normoactive bowel sounds. No organomegaly or  masses were appreciated. No hernias or bruits were noted.  EXTREMITIES: Revealed 1+ edema. No clubbing or cyanosis. Pulses were 2+ bilaterally.  SKIN: Warm and dry without rash or lesions.  NEUROLOGIC: Cranial nerves II through XII grossly intact. Deep tendon reflexes were symmetric. Motor and sensory exam is nonfocal.  PSYCHIATRIC: Revealed a patient who is alert and oriented to person, place and time. She was cooperative and used good judgment.   LABORATORY DATA: CXR revealed pulmonary edema. EKG revealed sinus tachycardia with an incomplete right bundle branch block and an old septal infarct. Anterolateral ST segment changes were noted. Her white count was 9.5 with a hemoglobin of 10.3. Troponin was 5.85 with an MB of 35.2 and a BNP of 6310. Glucose 176 with a BUN of 38, creatinine 1.83, sodium of 144, potassium of 4.6 and a GFR of 24.   ASSESSMENT:  1. Acute non-ST elevation myocardial infarction.  2. Acute on chronic systolic congestive heart failure.  3. Hyperglycemia.  4. Stage III chronic kidney disease.  5. Benign hypertension.  6. Tachycardia.  7. Anemia of chronic disease.  8. Hypothyroidism.   PLAN: The patient will be admitted to telemetry. Her daughter is power of attorney, and the patient is a DNR according to her living will. She will be maintained on a heparin drip with topical nitrates and beta blocker therapy. Will give IV Lasix x1. Will follow her sugars and follow her renal function closely. Will follow serial cardiac enzymes and obtain an echocardiogram. Will consult cardiology. Follow up routine labs and a chest x-ray in the morning. Supplement oxygen and wean as tolerated. Will consult physical therapy and the social worker for placement. Further treatment and evaluation will depend upon the patient's progress.   TOTAL TIME SPENT ON THIS PATIENT: 50 minutes.    ____________________________ Duane LopeJeffrey D. Judithann SheenSparks, MD jds:lb D: 11/04/2013 10:35:16 ET T: 11/04/2013 11:27:41  ET JOB#: [id number]  cc: Duane LopeJeffrey D. Judithann SheenSparks, MD, <Dictator> Marisue IvanKanhka Linthavong, MD Blessed Girdner Rodena Medin Maximilliano Kersh MD ELECTRONICALLY SIGNED 11/04/2013 14:37

## 2014-12-22 NOTE — Consult Note (Signed)
PATIENT NAME:  Melissa Bright, Avamae E MR#:  [id number] DATE OF BIRTH:  12/27/1923  DATE OF CONSULTATION:  11/05/2013  REFERRING PHYSICIAN:   CONSULTING PHYSICIAN:  Marcina MillardAlexander Labresha Mellor, MD  PRIMARY CARE PHYSICIAN:  Marisue IvanKanhka Linthavong, MD  CARDIOLOGIST: Marcina MillardAlexander Zaul Hubers, MD   CHIEF COMPLAINT: Chest pain.   REASON FOR CONSULTATION: Consultation requested for evaluation of acute myocardial infarction.   HISTORY OF PRESENT ILLNESS: The patient is an 81 year old female with known history of coronary artery disease, status post prior MI and multiple coronary stents. The patient was in her usual state of health until 11/04/13, when she awoke at 3:00 a.m. with substernal chest discomfort. [facility] where EKG revealed nondiagnostic ST elevations in leads V1 and V2 with lateral T wave inversions. Initial troponin was 5.85. The patient was admitted to telemetry where followup troponin was greater than 40. The patient experienced chest discomfort for several hours. This morning, the patient reports feeling much better, is chest pain-free. Denies shortness of breath.   PAST MEDICAL HISTORY: 1.  Status post myocardial infarction 12/05.  2.  Status post multiple coronary stents.  3.  Ischemic cardiomyopathy.  4.  Chronic systolic congestive heart failure.  5.  Hypertension.  6.  Hyperlipidemia.  7.  Peripheral vascular disease status, post left renal artery stent.  8.  History of TIAs. 9.  Left carotid stenosis.  10.  Chronic anemia.  11.  Hypothyroidism.  12.  Recurrent UTIs.  13.  Diverticulosis.  14. Osteoarthritis.  15.  Chronic kidney disease.   MEDICATIONS: Aspirin 81 mg daily, hydralazine 25 mg b.i.d., carvedilol 6.25 mg b.i.d., Imdur 30 mg daily, Lipitor 40 mg daily, Protonix 40 mg b.i.d., folic acid 400 mcg daily, B12 1000 mcg daily, Synthroid 112 mcg daily, vitamin D2 1000 units daily  SOCIAL HISTORY:  The patient is a widow. She currently lives alone, however,  multiple children, including a daughter and son, visit with her daily.   FAMILY HISTORY: Positive for coronary artery disease.   REVIEW OF SYSTEMS:    CONSTITUTIONAL: No fever or chills.  EYES: No blurry vision.  EARS: No hearing loss.  RESPIRATORY: No shortness of breath.  CARDIOVASCULAR: The patient had chest pain as described above which is now resolved.  GASTROINTESTINAL: No nausea, vomiting or diarrhea.  GENITOURINARY: No dysuria or hematuria.  ENDOCRINE: No polyuria or polydipsia.  MUSCULOSKELETAL: No arthralgias or myalgias.  NEUROLOGICAL: No focal muscle weakness or numbness.  PSYCHOLOGICAL: No depression or anxiety.   PHYSICAL EXAMINATION: VITAL SIGNS: Blood pressure 143/77, pulse 69, respirations 20, temperature 98.3, pulse ox 96%.  HEENT: Pupils equal, reactive to light and accommodation.  NECK: Supple without thyromegaly.  LUNGS: Clear.  HEART: Normal JVP. Diffuse PMI. Regular rate and rhythm. Normal S1, S2. No appreciable gallop, murmur or rub.  ABDOMEN: Soft and nontender.  EXTREMITIES: There was 1+ bilateral pedal edema.  MUSCULOSKELETAL: Normal muscle tone.  NEUROLOGIC: The patient is alert and oriented x 3. Motor and sensory both grossly intact.   IMPRESSION: An 81 year old female with known history of coronary artery disease, who presents with prolonged episode of chest pain, EKG consistent with anteroseptal ST elevation myocardial infarction confirmed by markedly elevated troponin. Echocardiogram showed severely reduced left ventricular function with left ventricular ejection fraction of 20% to 25%. The patient now chest pain-free.   RECOMMENDATIONS: 1.  Continue present medications.  2.  Continue heparin drip for 48 to 72 hours.  3.  Will likely restart clopidogrel 75 mg daily.  4.  In light of the patient's advanced age, probable completion of ST elevation myocardial infarction and chronic kidney disease, I am hesitant to proceed with cardiac catheterization and  potential risk for renal failure at this time. Discussed this with the patient and the patient's family members, who agree.   ____________________________ Marcina Millard, MD ap:cs D: 11/05/2013 10:52:16 ET T: 11/05/2013 14:34:37 ET JOB#: [id number]  cc: Marcina Millard, MD, <Dictator> Marcina Millard MD ELECTRONICALLY SIGNED 11/21/2013 12:45

## 2014-12-22 NOTE — H&P (Signed)
PATIENT NAME:  Melissa Bright, Melissa Bright MR#:  [id number] DATE OF BIRTH:  1924-05-10  DATE OF ADMISSION:  07/14/2014  PRIMARY CARE PHYSICIAN: Marisue IvanKanhka Linthavong, MD  PRIMARY CARDIOLOGIST: Marcina MillardAlexander Paraschos, MD  CHIEF COMPLAINT: Syncope.   HISTORY OF PRESENT ILLNESS: This is an 81 year old female with a history of coronary artery disease, status post ST elevation myocardial infarction in March 2015, chronic congestive heart failure, ejection fraction of 20 -25%, chronic kidney disease stage 3, peripheral vascular disease status post a left renal artery stent, history of transient ischemic attacks, and chronic anemia, who presents with the above complaint. According to the family who is at bedside, the patient was using the commode and, after her bowel movement, her daughter was helping her put her underpants on, when she passed out. Apparently, she had lost consciousness for about 8-10 minutes. During this time, she was sitting on commode unresponsive; daughter was holding her. The daughter called 911 and, as rescue squad arrived at her house, she was opening her eyes and she began to come to.   REVIEW OF SYSTEMS:  CONSTITUTIONAL: No fever, fatigue, weakness. She has generalized weakness. She is basically wheelchair bound.  EYES: No glaucoma.  EARS, NOSE, AND THROAT:  Some mild hearing loss. No post nasal drainage, sinus pain, or dyspnea.  RESPIRATORY: No cough, wheezing, hemoptysis. Marland Kitchen.  CARDIOVASCULAR: No chest pain or orthopnea. She has chronic lower extremity edema. No  palpitations. GASTROINTESTINAL: No nausea, vomiting, diarrhea, abdominal pain. Guaiac positive k.  GUAIAC: Denies dysuria or hematuria.  ENDOCRINE: No polyuria or polydipsia.  HEMATOLOGIC AND LYMPHATIC: Positive anemia and easy bruising.  SKIN: No rash or lesions.  MUSCULOSKELETAL: Positive weakness. The patient is wheelchair bound.  NEUROLOGIC: Positive history of TIAs.  PSYCHIATRIC: No history of anxiety or depression.   PAST  MEDICAL HISTORY:  1.  Status post ST elevation myocardial infarction in March 2015.  2.  Ischemic cardiomyopathy with EF of 20-25%.  3.  History of coronary artery disease with stents.  4.  Hypertension.  5.  Hyperlipidemia.  6.  Peripheral vascular disease, status post left renal artery stent.  7.  History of TIAs. 8.  Left carotid stenosis.  9.  Anemia of chronic disease.  10.  Hypothyroidism.  11.  Chronic kidney disease, stage III.   MEDICATIONS:  1.  Sodium bicarbonate 650 mg p.o. b.i.d.  2.  Atorvastatin 40 mg at bedtime.  3.  Docusate 100 mg p.o. b.i.d.  4.  Imdur 30 mg daily.  5.  Vitamin B12 1000 mcg daily.  6.  Coreg 6.25 b.i.d.  7.  Pantoprazole 40 mg p.o. b.i.d.  8.  Folic acid 0.4 mg daily.  9.  Aspirin 81 mg daily.  10.  Synthroid 112 mcg daily.  11.  Hydralazine 25 mg p.o. b.i.d. if systolic blood pressure greater than 180 and diastolic 110.  12.  Nitroglycerin sublingual p.r.n. chest pain.  13.  Plavix 75 mg daily.   SOCIAL HISTORY: The patient lives with her son. No tobacco, alcohol or drug use.    FAMILY HISTORY: No history of CAD. Positive history of diabetes.   ALLERGIES: MACROBID.   SURGICAL HISTORY:  1.  Bilateral knee surgery.  2.  Hysterectomy.  3.  Left kidney stent.  4.  CAD with multiple stents in her heart.   PHYSICAL EXAMINATION:  VITAL SIGNS: Temperature is 98, pulse 61, respirations 18, blood pressure 187/63, 100% on room air.  GENERAL: The patient is alert and oriented x 3 not in acute  distress.  HEENT: Head is atraumatic. Her right pupil does deviate outward, which is at her baseline. They do accommodate to light. Oropharynx is clear without any exudates noted.  NECK: Supple. No JVD or enlarged thyroid noted.  CARDIOVASCULAR: Regular rate and rhythm. There is a 2/6 systolic ejection murmur heard best at the left sternal border. PMI is laterally displaced.  LUNGS: Clear to auscultation without crackles, rales, rhonchi or wheezing. Normal  to percussion.  ABDOMEN: Bowel sounds are positive. Nontender, nondistended. No hepatosplenomegaly.  EXTREMITIES: She has 2+ pitting edema, which is chronic for her, as well as ankle edema.  NEUROLOGIC: Cranial nerves II through XII, with the exception of her right eye deviation, is intact.  MUSCULOSKELETAL: Upper extremities 4/5. Lower extremities 3+/5 bilaterally and symmetrically.  SKIN: Without rash or lesions.   LABORATORIES: White blood cells 7.9, hemoglobin 7, hematocrit 23.4, platelets are 250,000.  Sodium 143, potassium 4.5, chloride 110, bicarbonate 29, BUN 41, creatinine 2.3, glucose is 127. Troponin less than 0.02.   CT of the head shows no acute intracranial hemorrhage or CVA.   Chest x-ray shows low lung volumes; no cardiopulmonary disease.   EKG: Normal sinus rhythm with a prolonged QT wave. She has Q waves in anterolateral leads.   ASSESSMENT AND PLAN: This is an 81 year old female who presents with syncope, and also noted to have anemia, acute on chronic, as well as acute on chronic renal failure.  1.  Syncope. Sounds like a vasovagal response, possibly due to her anemia. The patient is a hospice patient. She has known coronary disease and multiple stents. She had a recent ST elevation myocardial infarction. She also has known left carotid artery stenosis. For her syncope at this time, the patient will be admitted to telemetry. We will continue to monitor. We will also cycle cardiac enzymes. She had an echocardiogram in March 2015 with showed an ejection fraction of 20-25% with mild apical inferior septum, apical lateral segment and apex abnormalities, so I will not repeat this echocardiogram. I will also check orthostatics and the family has consented for blood transfusion, which could also be contributing to her syncope. Due to the fact that the patient is really wheelchair bound, we cannot order orthostatics as she does not have enough strength to stand up.  2.  Acute on chronic  anemia. The ER physician has consented the patient for blood transfusion. I also talked to them about her blood transfusion. Given her history of heart disease and recent ST elevation myocardial infarction in March, she would benefit from at least 1 unit of PRBCs. Apparently, she was also guaiac-positive as per the ER physician. At this time, we will just need to monitor her hemoglobin. Given her multiple comorbidities,  as the patient is also on hospice, would defer aggressive management, i.Bright. colonoscopy.  3.  Acute and chronic kidney disease. Her creatinine has increased from her baseline, about 1.8 to 2.3. We will hold nephrotoxic agents and repeat a creatinine in the morning, after blood transfusions because this could be from prerenal azotemia or acute tubular necrosis and poor perfusion from her anemia.  4.  History of coronary artery disease. Will continue outpatient medications.  5.  Ischemic cardiomyopathy. With the ejection fraction at 20-25%, this seems chronic and stable at this time. We will monitor I's and O's, and daily weight.  6.  History of hypertension with accelerated hypertension upon arrival. We will need to continue outpatient medications and monitor her blood pressure and add hydralazine p.r.n. IV  as well.  7. History of transient ischemic attack. The patient is on aspirin.  8.  The patient is a Do Not Resuscitate status.   TIME SPENT: Approximately 50 minutes.    ____________________________ Janyth Contes. Juliene Pina, MD spm:MT D: 07/14/2014 15:36:20 ET T: 07/14/2014 16:10:02 ET JOB#: [id number]  cc: Liller Yohn P. Juliene Pina, MD, <Dictator> Marisue Ivan, MD Janyth Contes Missie Gehrig MD ELECTRONICALLY SIGNED 07/14/2014 18:31

## 2014-12-26 NOTE — Consult Note (Signed)
Brief Consult Note: Diagnosis: chronic anemia, possible melena,.   Patient was seen by consultant.   Consult note dictated.   Recommend further assessment or treatment.   Comments: Please see full GI consult.  Patietn is and 81 yo f with multiple medical issues, currently in hospice care.  There is a history of chronic anemia going back several years.  Patietn presenting with a dark stool possible melena yesterday, no repeat stool since then (over 24 hours).  She has been hemodynamically stable. No abdominal pain.  I feel patietn is high risk for sedated proceedure.  Both I and family (a daughter is present) are reticent to proceed to do this.  I would continue iron, add carafate, continue ppi.  Monthly hgb and transfuse as needed.   Check for helicobactr pylori via stool antigen ( she has had no gastritis in the past).  Dr Shelle Ironein to round over the weekend.  Electronic Signatures: Barnetta ChapelSkulskie, Chae Shuster (MD)  (Signed 01-Jan-16 20:07)  Authored: Brief Consult Note   Last Updated: 01-Jan-16 20:07 by Barnetta ChapelSkulskie, Apostolos Blagg (MD)

## 2014-12-26 NOTE — H&P (Signed)
PATIENT NAME:  Melissa Bright, Melissa Bright MR#:  [id number] DATE OF BIRTH:  1924-03-25  DATE OF ADMISSION:  08/30/2014  PRIMARY CARE PHYSICIAN: Dr. Burnadette Pop.   CHIEF COMPLAINT: Passed out.   HISTORY OF PRESENT ILLNESS: This is an 81 year old female with multiple medical issues. She passed out last Friday and again this a.m. and yesterday she had a near syncope. Today described that she had to go to the bathroom, she was brought to the bathroom and then she urinated and could not get up. Family laid her head back and rested with a cold towel. She had 2  bowel movements today, black stool, they did see slight redness there, but mostly black. She normally follows with hospice and is a DNR. In the ER she was found to have a hemoglobin of 7.1 and was guaiac positive black stool by the ER physician. Hospitalist services were contacted for further evaluation.   PAST MEDICAL HISTORY: ST-elevation myocardial infarction March 2015, ischemic cardiomyopathy, EF of 20-25%, coronary artery disease, hypertension, hyperlipidemia, peripheral vascular disease, TIAs, carotid stenosis, anemia of chronic disease, hypothyroidism, chronic kidney disease stage IV.   PAST SURGICAL HISTORY: Bilateral knee replacement, hysterectomy, left kidney stent.   ALLERGIES: MACROBID.   MEDICATIONS: As per prescription writer include aspirin 81 mg daily, Astepro 2 sprays twice a day each nostril, atorvastatin 40 mg at bedtime, Coreg 6.25 mg half tablet twice a day, Colace 100 mg twice a day, fish oil 1 capsule at bedtime, folic acid 0.4 mg daily, furosemide 1 tablet daily, hydralazine 50 mg half tablet twice a day as needed for systolic blood pressure greater than 180, Imdur 30 mg daily, levothyroxine 112 mcg daily, nitroglycerin p.r.n., Protonix 40 mg twice a day, Plavix 75 mg daily although family states that she has not been on Plavix in a long time, sodium bicarbonate 650 mg twice a day.   FAMILY HISTORY: Father unknown. Mother died in her 9s  of pneumonia.  SOCIAL HISTORY: Family stays with her 24/7 and has hospice nurse come in every once in a while. Quit smoking over 30 years ago. No alcohol. No drug use. Used to work in a [employer].   REVIEW OF SYSTEMS:    CONSTITUTIONAL: Positive for weight loss. No fevers, chills, or sweats. Positive for weakness.  EYES: She does wear glasses.  EARS, NOSE, MOUTH, AND THROAT: Decreased hearing. Positive runny nose. No sore throat. No difficulty swallowing.  CARDIOVASCULAR: No chest pain. No palpitations.  RESPIRATORY: No shortness of breath. No cough. No sputum. No hemoptysis.  GASTROINTESTINAL: Positive for black stools. No nausea. No vomiting. No abdominal pain.  GENITOURINARY: No burning on urination. No hematuria.  INTEGUMENTARY: Positive for psoriasis.  NEUROLOGIC: Positive for syncope.  PSYCHIATRIC: No anxiety or depression.  ENDOCRINE: Positive for hypothyroidism.  HEMATOLOGIC AND LYMPHATIC: Positive for anemia.   PHYSICAL EXAMINATION:  VITAL SIGNS: Temperature 98.1, pulse 67, respirations 14, blood pressure 205/63, pulse oximetry 100% on room air.  GENERAL: No respiratory distress.  EYES: Conjunctivae pale, lids normal. Pupils equal, round, and reactive to light. Extraocular muscles intact. No nystagmus.  EARS, NOSE, MOUTH, AND THROAT: Tympanic membranes, no erythema. Nasal mucosa, no erythema. Throat, no erythema. No exudate seen. Lips and gums, no lesions.  NECK: No JVD. Positive bruits bilaterally. No lymphadenopathy. No thyromegaly. No thyroid nodules palpated.  LUNGS: Clear to auscultation. No use of accessory muscles to breathe. No rhonchi, rales, or wheeze heard.  CARDIOVASCULAR: S1, S2 normal. Positive 2 out of 6 systolic ejection murmur. Carotid upstroke  2 + bilaterally. Positive bruits bilaterally.  Dorsalis pedis pulses 1 + bilaterally. Trace edema of the lower extremity.  ABDOMEN: Soft, nontender. No organomegaly/splenomegaly. Normoactive bowel sounds. No masses felt.   LYMPHATIC: No lymph nodes in the neck.  MUSCULOSKELETAL: Trace edema. No clubbing. No cyanosis.  SKIN: No ulcers seen.  NEUROLOGIC: Cranial nerves II through XII grossly intact. Deep tendon reflexes 1 + bilateral lower extremities.  PSYCHIATRIC: The patient is oriented to person and place.   LABORATORY AND RADIOLOGICAL DATA:  Urinalysis, 3 + leukocyte esterase, 1 + blood. Glucose 110, BUN 49, creatinine 2.19, sodium 142, potassium 4.5, chloride 108, CO2 of 29. White blood cell count 7.1, H and H 7.1 and 22.7. Magnesium 2.8. INR 1.0. Troponin negative. EKG normal sinus rhythm, 73 beats per minute, flipped T waves laterally.   ASSESSMENT AND PLAN:  1.  Gastrointestinal bleed with melena, likely upper source with acute on chronic blood loss anemia. We will give IV Protonix. Hold aspirin. Need to clarify whether the patient is taking Plavix or not, since the patient did have an MI in March likely is taking Plavix, will have to hold Plavix at this point. We will get a GI consult in the a.m. ER physician ordered for 1 unit of transfusion.  2.  Chronic kidney disease stage IV. This is chronic.  3.  History of coronary artery disease, unable to give blood thinners at this time. Continue low dose Coreg.  4.  Peripheral vascular disease, unable to give blood thinners at this time.  5.  Hypertension. Blood pressure elevated and accelerated right now. P.r.n., hydralazine is what she uses at home, we will continue to give that.  6.  Hyperlipidemia. Continue Lipitor.  7.  Hypothyroidism. Continue levothyroxine.  8.  Urinary tract infection. Will start Rocephin and send off a urine culture.    TIME SPENT ON ADMISSION: 55 minutes.   CODE STATUS:  The patient is a DNR.     ____________________________ Herschell Dimesichard J. Renae GlossWieting, MD rjw:bu D: 08/30/2014 19:21:59 ET T: 08/30/2014 20:21:07 ET JOB#: [id number]  cc: Herschell Dimesichard J. Renae GlossWieting, MD, <Dictator> Marisue IvanKanhka Linthavong, MD Salley ScarletICHARD J Sheritha Louis MD ELECTRONICALLY SIGNED  08/31/2014 10:25

## 2014-12-26 NOTE — Consult Note (Signed)
PATIENT NAME:  Melissa Bright, Melissa Bright MR#:  [id number] DATE OF BIRTH:  01-16-1924  DATE OF CONSULTATION:  08/31/2014  REFERRING PHYSICIAN:   CONSULTING PHYSICIAN:  Christena Deem, MD  Patient of Dr. Burnadette Pop.    REASON FOR CONSULTATION: GI bleed/black stool.   HISTORY OF PRESENT ILLNESS: Miss Kreft is an 81 year old Caucasian female whom I have seen in the past. She has a long-standing history of iron deficiency anemia. Apparently she had had a black stool at home and family brought her to the Emergency Room for further evaluation. This episode was following an episode of some dizziness yesterday. She had 2 black stools that were semiformed. She had a similar episode this past November. She has so far been given 1 unit of packed red cells. There has been no bowel movement today and no repeat since this occurred at home. She does get dizzy frequently, this likely related to other etiologies as well. She denies any nausea, vomiting, or abdominal pain. She does not have any heartburn, but she does take medicine, Protonix, on a daily basis. There is no dysphagia. She does have some intermittent constipation for which she will occasionally use some MiraLax. Usually her stools are brown in color. There is no history of peptic ulcer disease. She does take an 81 mg aspirin. Previously she had been on Plavix, but she has not taken that for quite some time according to the daughter. In review she had a colonoscopy 11/19/2010, that being for iron deficiency anemia. She was found to have 3 small polyps, all of these were tubular adenomas, as well as some diverticulosis. She had an EGD done 09/25/2010 again for anemia. 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 There was no evidence of other bleeding lesion. She is currently hemodynamically stable and again she has had no repeat bowel movements since yesterday.   PAST MEDICAL HISTORY:  1.  She has had myocardial infarction x 2 with  ischemic cardiomyopathy, ejection fraction 20-25%.  2.  She has a history of chronic kidney disease type III.  3.  History of TIAs.  4.  Anemia of chronic disease.  5.  Peripheral vascular disease.  6.  Hypertension.  7.  Hyperlipidemia.  8.  Carotid stenosis.  9.  She has had bilateral knee replacement, a hysterectomy, and a left kidney stent.  10.  She did have an ST elevation myocardial infarction in March of 2015.   On previous evaluation for anemia at around the time of her myocardial infarction it was felt she was very high risk for sedated procedure. She is currently in hospice care.   SOCIAL HISTORY: She does not smoke. She does not use alcohol. There is no drug use.   GASTROINTESTINAL FAMILY HISTORY: Negative for colorectal cancer, liver disease, or ulcers. Father unknown.   REVIEW OF SYSTEMS: 10 systems reviewed per admission history and physical, agree with same.   PHYSICAL EXAMINATION:  VITAL SIGNS: Temperature is 98.1, pulse 74, respirations 20, blood pressure 192/71, pulse oximetry 95%.  GENERAL: She is an 81 year old Caucasian female in no acute distress.  HEENT: Normocephalic, atraumatic.  EYES: Anicteric.  NOSE: Septum midline.  OROPHARYNX: No lesions.  NECK: No JVD.  HEART: Regular rate and rhythm.  LUNGS: Clear.  ABDOMEN: Soft, nontender, nondistended. Bowel sounds positive, normoactive.  RECTAL: Anorectal examination shows a dark brownish-black fleck of stool that is Hemoccult positive.  EXTREMITIES: No clubbing, cyanosis. Trace edema.   LABORATORY DATA:  Include the following: Yesterday  on admission to the hospital she had a glucose 110, BUN 49, creatinine 2.19, sodium 142, potassium 4.5, chloride 108, bicarbonate 29, magnesium 2.8. Troponin I x 1 of 0.02. Hemogram showing white count of 7.1, H and H  7.1/22.7, platelet count of 298,000. Her MCV is 81. She had 1 unit of blood transfused and repeat laboratories this morning showed a hemoglobin of 7.9. Her BUN this  morning was 44 with a creatinine of 2.18. She has had no imaging studies on this hospitalization.   ASSESSMENT:  Anemia/chronic iron deficiency. The patient is currently hemodynamically stable. She has not had any repeat stools since yesterday. She has been on a PPI. Although she is taking an 81 mg aspirin daily she previously was on Plavix, however the patient's daughter states she no longer takes that and "hasn't for a year" although her MI was in March of this past year and she was prescribed it at that time. I am uncertain as to that status. It may have been discontinued due to her anemia. The patient is currently in hospice care and I did broach the subject of luminal procedures, however I am somewhat reticent to recommend that and family is reticent to want to have it done. As such would transfuse as needed, continue proton pump inhibitor. She may be a patient who will need to be seen on a monthly basis for check of hemoglobin and transfuse as needed. Could also add Carafate 1 gram q.i.d.    ____________________________ Christena DeemMartin U. Skulskie, MD mus:bu D: 08/31/2014 19:59:00 ET T: 08/31/2014 20:21:26 ET JOB#: [id number]  cc: Christena DeemMartin U. Skulskie, MD, <Dictator> Christena DeemMARTIN U SKULSKIE MD ELECTRONICALLY SIGNED 09/11/2014 12:06

## 2014-12-27 ENCOUNTER — Emergency Department: Admit: 2014-12-27 | Disposition: A | Discharge status: Home / Self Care | Payer: Self-pay | Admitting: Student

## 2014-12-27 LAB — COMPREHENSIVE METABOLIC PANEL
ANION GAP: 5 — AB (ref 7–16)
Albumin: 3 g/dL — ABNORMAL LOW
Alkaline Phosphatase: 86 U/L
BILIRUBIN TOTAL: 0.2 mg/dL — AB
BUN: 45 mg/dL — ABNORMAL HIGH
CHLORIDE: 107 mmol/L
CO2: 27 mmol/L
Calcium, Total: 9.2 mg/dL
Creatinine: 2.22 mg/dL — ABNORMAL HIGH
EGFR (African American): 22 — ABNORMAL LOW
EGFR (Non-African Amer.): 19 — ABNORMAL LOW
Glucose: 203 mg/dL — ABNORMAL HIGH
POTASSIUM: 4.1 mmol/L
SGOT(AST): 22 U/L
SGPT (ALT): 13 U/L — ABNORMAL LOW
Sodium: 139 mmol/L
Total Protein: 6.7 g/dL

## 2014-12-27 LAB — TROPONIN I: Troponin-I: 0.03 ng/mL

## 2014-12-27 LAB — CBC
HCT: 34.4 % — ABNORMAL LOW (ref 35.0–47.0)
HGB: 11.1 g/dL — ABNORMAL LOW (ref 12.0–16.0)
MCH: 30.3 pg (ref 26.0–34.0)
MCHC: 32.4 g/dL (ref 32.0–36.0)
MCV: 94 fL (ref 80–100)
Platelet: 198 10*3/uL (ref 150–440)
RBC: 3.68 10*6/uL — ABNORMAL LOW (ref 3.80–5.20)
RDW: 15.5 % — AB (ref 11.5–14.5)
WBC: 6.6 10*3/uL (ref 3.6–11.0)

## 2014-12-27 LAB — URINALYSIS, COMPLETE
BACTERIA: NONE SEEN
Bilirubin,UR: NEGATIVE
Blood: NEGATIVE
Glucose,UR: NEGATIVE mg/dL (ref 0–75)
Ketone: NEGATIVE
Leukocyte Esterase: NEGATIVE
Nitrite: NEGATIVE
PH: 6 (ref 4.5–8.0)
SPECIFIC GRAVITY: 1.01 (ref 1.003–1.030)

## 2014-12-27 LAB — APTT: Activated PTT: 33.2 secs (ref 23.6–35.9)

## 2014-12-27 LAB — PROTIME-INR
INR: 1
Prothrombin Time: 13.7 secs

## 2014-12-27 LAB — CK TOTAL AND CKMB (NOT AT ARMC)
CK, TOTAL: 31 U/L — AB
CK-MB: 1.5 ng/mL

## 2014-12-30 NOTE — Consult Note (Signed)
Details:   - GI Note:  S: no bleeding or melena.   Cannot give ROS. Denies abd pain, n/v.   Exam: vss nad, a and o x 2 cta, no w/c rrr, no m/r/g nabs, soft,nd  Labs: Hgb stable.   A/P: melena, anemia.      No further bleeding, hgb stable.  - No plans for EGD given poor functional status and clinical resolution.   Electronic Signatures: Dow Adolphein, Jadin Kagel (MD)  (Signed 03-Jan-16 14:41)  Authored: Details   Last Updated: 03-Jan-16 14:41 by Dow Adolphein, Connelly Spruell (MD)

## 2014-12-30 NOTE — Discharge Summary (Signed)
PATIENT NAME:  Melissa Bright, Melissa Bright MR#:  [id number] DATE OF BIRTH:  24-Mar-1924  DATE OF ADMISSION:  08/30/2014 DATE OF DISCHARGE: 09/03/2013   DISCHARGE DIAGNOSES:  1.  Gastrointestinal bleed.  2.  Anemia with hemoglobin 7.5.  3.  Urinary tract infection.   DISCHARGE MEDICATIONS:  1.  Sodium bicarbonate 650 mg p.o. b.i.d.  2.  Atorvastatin 40 mg p.o. at bedtime.  3.  Docusate 100 mg p.o. b.i.d.  4.  Imdur 30 mg extended release once daily.  5.  Pantoprazole 40 mg p.o. b.i.d.  6.  Folic acid 0.4 mg p.o. daily.  7.  Astepro 205.5 mcg 2 sprays each nostril b.i.d.  8.  Hydralazine 50 mg half a tablet b.i.d. as needed for systolic blood pressure greater than 180 and diastolic greater than 110.  9.  Nitroglycerin 0.4 mg sublingual every 5 minutes p.r.n. for chest pain up to 3 doses.  10.  Carvedilol 6.25 mg half tablet p.o. b.i.d.  11.  Levothyroxine 112 mcg p.o. daily.  12.  Fish oil 1 capsule p.o. at bedtime.  13.  Furosemide 20 mg p.o. daily.  14.  Amoxicillin 500 mg p.o. t.i.d. x 6 more days.   CONSULTS: GI.   PROCEDURES: None.   PERTINENT LABORATORY STUDIES: On day of discharge hemoglobin 7.5.   BRIEF HOSPITAL COURSE:  1.  Acute gastrointestinal bleed. The patient initially came in with symptomatic anemia consistent with acute upper GI bleed having melenic stools. She was evaluated by GI but no procedure performed at this time. The patient had subsided in her bleeding and was no longer symptomatic. Hemoglobin remained stable at 7.5 and hemodynamically stable otherwise. She is followed by hospice at home to discuss having hospice follow up with her on Thursday and rechecking a CBC and H. pylori as well.  2.  UTI. The patient was found to have enterococcus. Plan to treat with amoxicillin x 6 more days for 7 day total treatment.   DISPOSITION: She is in stable condition and will be discharged to home with home health services with plans to check a CBC and H. pylori on  Thursday.    ____________________________ Marisue IvanKanhka Jazyiah Yiu, MD kl:ah D: 09/03/2014 13:17:32 ET T: 09/03/2014 13:43:43 ET JOB#: [id number]  cc: Marisue IvanKanhka Monai Hindes, MD, <Dictator> Marisue IvanKANHKA Orvin Netter MD ELECTRONICALLY SIGNED 09/13/2014 12:02

## 2014-12-30 NOTE — Consult Note (Signed)
Details:   - GI Note:  Hgb down slightly this am but likely lab variation.  Poor candidate for endoscopy.    Would not consider EGD except for overt significant bleeding.  I will sign off.  Please call with questions.   Electronic Signatures: Dow Adolphein, Sharone Almond (MD)  (Signed 04-Jan-16 11:02)  Authored: Details   Last Updated: 04-Jan-16 11:02 by Dow Adolphein, Delford Wingert (MD)

## 2015-02-22 DIAGNOSIS — H2511 Age-related nuclear cataract, right eye: Secondary | ICD-10-CM | POA: Diagnosis not present

## 2015-06-17 DIAGNOSIS — R300 Dysuria: Secondary | ICD-10-CM | POA: Diagnosis not present

## 2015-07-08 DIAGNOSIS — D520 Dietary folate deficiency anemia: Secondary | ICD-10-CM | POA: Diagnosis not present

## 2015-07-08 DIAGNOSIS — E039 Hypothyroidism, unspecified: Secondary | ICD-10-CM | POA: Diagnosis not present

## 2015-07-08 DIAGNOSIS — N390 Urinary tract infection, site not specified: Secondary | ICD-10-CM | POA: Diagnosis not present

## 2015-07-08 DIAGNOSIS — I10 Essential (primary) hypertension: Secondary | ICD-10-CM | POA: Diagnosis not present

## 2015-07-10 DIAGNOSIS — Z993 Dependence on wheelchair: Secondary | ICD-10-CM | POA: Diagnosis not present

## 2015-07-10 DIAGNOSIS — N189 Chronic kidney disease, unspecified: Secondary | ICD-10-CM | POA: Diagnosis not present

## 2015-07-10 DIAGNOSIS — I129 Hypertensive chronic kidney disease with stage 1 through stage 4 chronic kidney disease, or unspecified chronic kidney disease: Secondary | ICD-10-CM | POA: Diagnosis not present

## 2015-07-10 DIAGNOSIS — I252 Old myocardial infarction: Secondary | ICD-10-CM | POA: Diagnosis not present

## 2015-07-10 DIAGNOSIS — Z87440 Personal history of urinary (tract) infections: Secondary | ICD-10-CM | POA: Diagnosis not present

## 2015-07-10 DIAGNOSIS — Z8673 Personal history of transient ischemic attack (TIA), and cerebral infarction without residual deficits: Secondary | ICD-10-CM | POA: Diagnosis not present

## 2015-07-10 DIAGNOSIS — I2510 Atherosclerotic heart disease of native coronary artery without angina pectoris: Secondary | ICD-10-CM | POA: Diagnosis not present

## 2015-07-12 DIAGNOSIS — Z87440 Personal history of urinary (tract) infections: Secondary | ICD-10-CM | POA: Diagnosis not present

## 2015-07-12 DIAGNOSIS — Z993 Dependence on wheelchair: Secondary | ICD-10-CM | POA: Diagnosis not present

## 2015-07-12 DIAGNOSIS — N189 Chronic kidney disease, unspecified: Secondary | ICD-10-CM | POA: Diagnosis not present

## 2015-07-12 DIAGNOSIS — I252 Old myocardial infarction: Secondary | ICD-10-CM | POA: Diagnosis not present

## 2015-07-12 DIAGNOSIS — I2510 Atherosclerotic heart disease of native coronary artery without angina pectoris: Secondary | ICD-10-CM | POA: Diagnosis not present

## 2015-07-12 DIAGNOSIS — Z8673 Personal history of transient ischemic attack (TIA), and cerebral infarction without residual deficits: Secondary | ICD-10-CM | POA: Diagnosis not present

## 2015-07-12 DIAGNOSIS — I129 Hypertensive chronic kidney disease with stage 1 through stage 4 chronic kidney disease, or unspecified chronic kidney disease: Secondary | ICD-10-CM | POA: Diagnosis not present

## 2015-07-16 DIAGNOSIS — Z8673 Personal history of transient ischemic attack (TIA), and cerebral infarction without residual deficits: Secondary | ICD-10-CM | POA: Diagnosis not present

## 2015-07-16 DIAGNOSIS — N189 Chronic kidney disease, unspecified: Secondary | ICD-10-CM | POA: Diagnosis not present

## 2015-07-16 DIAGNOSIS — I129 Hypertensive chronic kidney disease with stage 1 through stage 4 chronic kidney disease, or unspecified chronic kidney disease: Secondary | ICD-10-CM | POA: Diagnosis not present

## 2015-07-16 DIAGNOSIS — Z87440 Personal history of urinary (tract) infections: Secondary | ICD-10-CM | POA: Diagnosis not present

## 2015-07-16 DIAGNOSIS — I252 Old myocardial infarction: Secondary | ICD-10-CM | POA: Diagnosis not present

## 2015-07-16 DIAGNOSIS — I2510 Atherosclerotic heart disease of native coronary artery without angina pectoris: Secondary | ICD-10-CM | POA: Diagnosis not present

## 2015-07-16 DIAGNOSIS — Z993 Dependence on wheelchair: Secondary | ICD-10-CM | POA: Diagnosis not present

## 2015-07-18 DIAGNOSIS — Z87440 Personal history of urinary (tract) infections: Secondary | ICD-10-CM | POA: Diagnosis not present

## 2015-07-18 DIAGNOSIS — I252 Old myocardial infarction: Secondary | ICD-10-CM | POA: Diagnosis not present

## 2015-07-18 DIAGNOSIS — I129 Hypertensive chronic kidney disease with stage 1 through stage 4 chronic kidney disease, or unspecified chronic kidney disease: Secondary | ICD-10-CM | POA: Diagnosis not present

## 2015-07-18 DIAGNOSIS — N189 Chronic kidney disease, unspecified: Secondary | ICD-10-CM | POA: Diagnosis not present

## 2015-07-18 DIAGNOSIS — I2510 Atherosclerotic heart disease of native coronary artery without angina pectoris: Secondary | ICD-10-CM | POA: Diagnosis not present

## 2015-07-18 DIAGNOSIS — Z993 Dependence on wheelchair: Secondary | ICD-10-CM | POA: Diagnosis not present

## 2015-07-18 DIAGNOSIS — Z8673 Personal history of transient ischemic attack (TIA), and cerebral infarction without residual deficits: Secondary | ICD-10-CM | POA: Diagnosis not present

## 2015-07-22 DIAGNOSIS — I2510 Atherosclerotic heart disease of native coronary artery without angina pectoris: Secondary | ICD-10-CM | POA: Diagnosis not present

## 2015-07-22 DIAGNOSIS — Z993 Dependence on wheelchair: Secondary | ICD-10-CM | POA: Diagnosis not present

## 2015-07-22 DIAGNOSIS — N189 Chronic kidney disease, unspecified: Secondary | ICD-10-CM | POA: Diagnosis not present

## 2015-07-22 DIAGNOSIS — Z87440 Personal history of urinary (tract) infections: Secondary | ICD-10-CM | POA: Diagnosis not present

## 2015-07-22 DIAGNOSIS — I252 Old myocardial infarction: Secondary | ICD-10-CM | POA: Diagnosis not present

## 2015-07-22 DIAGNOSIS — Z8673 Personal history of transient ischemic attack (TIA), and cerebral infarction without residual deficits: Secondary | ICD-10-CM | POA: Diagnosis not present

## 2015-07-22 DIAGNOSIS — I129 Hypertensive chronic kidney disease with stage 1 through stage 4 chronic kidney disease, or unspecified chronic kidney disease: Secondary | ICD-10-CM | POA: Diagnosis not present

## 2015-07-23 DIAGNOSIS — I129 Hypertensive chronic kidney disease with stage 1 through stage 4 chronic kidney disease, or unspecified chronic kidney disease: Secondary | ICD-10-CM | POA: Diagnosis not present

## 2015-07-23 DIAGNOSIS — I252 Old myocardial infarction: Secondary | ICD-10-CM | POA: Diagnosis not present

## 2015-07-23 DIAGNOSIS — I2510 Atherosclerotic heart disease of native coronary artery without angina pectoris: Secondary | ICD-10-CM | POA: Diagnosis not present

## 2015-07-23 DIAGNOSIS — N189 Chronic kidney disease, unspecified: Secondary | ICD-10-CM | POA: Diagnosis not present

## 2015-07-31 DIAGNOSIS — Z993 Dependence on wheelchair: Secondary | ICD-10-CM | POA: Diagnosis not present

## 2015-07-31 DIAGNOSIS — I129 Hypertensive chronic kidney disease with stage 1 through stage 4 chronic kidney disease, or unspecified chronic kidney disease: Secondary | ICD-10-CM | POA: Diagnosis not present

## 2015-07-31 DIAGNOSIS — I252 Old myocardial infarction: Secondary | ICD-10-CM | POA: Diagnosis not present

## 2015-07-31 DIAGNOSIS — I2510 Atherosclerotic heart disease of native coronary artery without angina pectoris: Secondary | ICD-10-CM | POA: Diagnosis not present

## 2015-07-31 DIAGNOSIS — Z8673 Personal history of transient ischemic attack (TIA), and cerebral infarction without residual deficits: Secondary | ICD-10-CM | POA: Diagnosis not present

## 2015-07-31 DIAGNOSIS — Z87440 Personal history of urinary (tract) infections: Secondary | ICD-10-CM | POA: Diagnosis not present

## 2015-07-31 DIAGNOSIS — N189 Chronic kidney disease, unspecified: Secondary | ICD-10-CM | POA: Diagnosis not present

## 2015-08-01 DIAGNOSIS — R3915 Urgency of urination: Secondary | ICD-10-CM | POA: Diagnosis not present

## 2015-08-08 DIAGNOSIS — Z993 Dependence on wheelchair: Secondary | ICD-10-CM | POA: Diagnosis not present

## 2015-08-08 DIAGNOSIS — I2510 Atherosclerotic heart disease of native coronary artery without angina pectoris: Secondary | ICD-10-CM | POA: Diagnosis not present

## 2015-08-08 DIAGNOSIS — Z8673 Personal history of transient ischemic attack (TIA), and cerebral infarction without residual deficits: Secondary | ICD-10-CM | POA: Diagnosis not present

## 2015-08-08 DIAGNOSIS — Z87440 Personal history of urinary (tract) infections: Secondary | ICD-10-CM | POA: Diagnosis not present

## 2015-08-08 DIAGNOSIS — I252 Old myocardial infarction: Secondary | ICD-10-CM | POA: Diagnosis not present

## 2015-08-08 DIAGNOSIS — N189 Chronic kidney disease, unspecified: Secondary | ICD-10-CM | POA: Diagnosis not present

## 2015-08-08 DIAGNOSIS — I129 Hypertensive chronic kidney disease with stage 1 through stage 4 chronic kidney disease, or unspecified chronic kidney disease: Secondary | ICD-10-CM | POA: Diagnosis not present

## 2015-11-09 ENCOUNTER — Emergency Department: Payer: Commercial Managed Care - HMO

## 2015-11-09 ENCOUNTER — Encounter: Payer: Self-pay | Admitting: Emergency Medicine

## 2015-11-09 ENCOUNTER — Observation Stay
Admission: EM | Admit: 2015-11-09 | Discharge: 2015-11-10 | Disposition: A | Discharge status: Home / Self Care | Payer: Commercial Managed Care - HMO | Attending: Internal Medicine | Admitting: Internal Medicine

## 2015-11-09 DIAGNOSIS — I129 Hypertensive chronic kidney disease with stage 1 through stage 4 chronic kidney disease, or unspecified chronic kidney disease: Secondary | ICD-10-CM | POA: Insufficient documentation

## 2015-11-09 DIAGNOSIS — K219 Gastro-esophageal reflux disease without esophagitis: Secondary | ICD-10-CM | POA: Insufficient documentation

## 2015-11-09 DIAGNOSIS — I252 Old myocardial infarction: Secondary | ICD-10-CM | POA: Insufficient documentation

## 2015-11-09 DIAGNOSIS — Z79899 Other long term (current) drug therapy: Secondary | ICD-10-CM | POA: Diagnosis not present

## 2015-11-09 DIAGNOSIS — E039 Hypothyroidism, unspecified: Secondary | ICD-10-CM | POA: Diagnosis not present

## 2015-11-09 DIAGNOSIS — R0602 Shortness of breath: Secondary | ICD-10-CM

## 2015-11-09 DIAGNOSIS — N183 Chronic kidney disease, stage 3 (moderate): Secondary | ICD-10-CM | POA: Diagnosis not present

## 2015-11-09 DIAGNOSIS — I5020 Unspecified systolic (congestive) heart failure: Secondary | ICD-10-CM | POA: Insufficient documentation

## 2015-11-09 DIAGNOSIS — R079 Chest pain, unspecified: Secondary | ICD-10-CM | POA: Insufficient documentation

## 2015-11-09 DIAGNOSIS — D638 Anemia in other chronic diseases classified elsewhere: Secondary | ICD-10-CM | POA: Insufficient documentation

## 2015-11-09 DIAGNOSIS — E785 Hyperlipidemia, unspecified: Secondary | ICD-10-CM | POA: Diagnosis not present

## 2015-11-09 DIAGNOSIS — M1990 Unspecified osteoarthritis, unspecified site: Secondary | ICD-10-CM | POA: Insufficient documentation

## 2015-11-09 DIAGNOSIS — Z90710 Acquired absence of both cervix and uterus: Secondary | ICD-10-CM | POA: Diagnosis not present

## 2015-11-09 DIAGNOSIS — Z87891 Personal history of nicotine dependence: Secondary | ICD-10-CM | POA: Diagnosis not present

## 2015-11-09 DIAGNOSIS — I10 Essential (primary) hypertension: Secondary | ICD-10-CM | POA: Diagnosis not present

## 2015-11-09 DIAGNOSIS — Z66 Do not resuscitate: Secondary | ICD-10-CM | POA: Insufficient documentation

## 2015-11-09 DIAGNOSIS — I2510 Atherosclerotic heart disease of native coronary artery without angina pectoris: Secondary | ICD-10-CM | POA: Insufficient documentation

## 2015-11-09 DIAGNOSIS — I5022 Chronic systolic (congestive) heart failure: Secondary | ICD-10-CM | POA: Diagnosis not present

## 2015-11-09 DIAGNOSIS — Z881 Allergy status to other antibiotic agents status: Secondary | ICD-10-CM | POA: Insufficient documentation

## 2015-11-09 DIAGNOSIS — Z9889 Other specified postprocedural states: Secondary | ICD-10-CM | POA: Diagnosis not present

## 2015-11-09 DIAGNOSIS — Z23 Encounter for immunization: Secondary | ICD-10-CM | POA: Insufficient documentation

## 2015-11-09 DIAGNOSIS — Z8673 Personal history of transient ischemic attack (TIA), and cerebral infarction without residual deficits: Secondary | ICD-10-CM | POA: Diagnosis not present

## 2015-11-09 HISTORY — DX: Cerebral infarction, unspecified: I63.9

## 2015-11-09 HISTORY — DX: Disorder of kidney and ureter, unspecified: N28.9

## 2015-11-09 HISTORY — DX: Atherosclerotic heart disease of native coronary artery without angina pectoris: I25.10

## 2015-11-09 HISTORY — DX: Heart failure, unspecified: I50.9

## 2015-11-09 HISTORY — DX: Unspecified osteoarthritis, unspecified site: M19.90

## 2015-11-09 HISTORY — DX: Essential (primary) hypertension: I10

## 2015-11-09 HISTORY — DX: Encounter for other specified aftercare: Z51.89

## 2015-11-09 LAB — COMPREHENSIVE METABOLIC PANEL
ALBUMIN: 3.1 g/dL — AB (ref 3.5–5.0)
ALT: 12 U/L — ABNORMAL LOW (ref 14–54)
AST: 23 U/L (ref 15–41)
Alkaline Phosphatase: 93 U/L (ref 38–126)
Anion gap: 9 (ref 5–15)
BILIRUBIN TOTAL: 0.4 mg/dL (ref 0.3–1.2)
BUN: 40 mg/dL — AB (ref 6–20)
CHLORIDE: 107 mmol/L (ref 101–111)
CO2: 26 mmol/L (ref 22–32)
Calcium: 9.9 mg/dL (ref 8.9–10.3)
Creatinine, Ser: 2.11 mg/dL — ABNORMAL HIGH (ref 0.44–1.00)
GFR calc Af Amer: 23 mL/min — ABNORMAL LOW (ref >60)
GFR calc non Af Amer: 20 mL/min — ABNORMAL LOW (ref >60)
GLUCOSE: 133 mg/dL — AB (ref 65–99)
POTASSIUM: 4.3 mmol/L (ref 3.5–5.1)
SODIUM: 142 mmol/L (ref 135–145)
TOTAL PROTEIN: 7.5 g/dL (ref 6.5–8.1)

## 2015-11-09 LAB — TROPONIN I: Troponin I: 0.03 ng/mL (ref <0.031)

## 2015-11-09 LAB — URINALYSIS COMPLETE WITH MICROSCOPIC (ARMC ONLY)
BACTERIA UA: NONE SEEN
BILIRUBIN URINE: NEGATIVE
Glucose, UA: 50 mg/dL — AB
HGB URINE DIPSTICK: NEGATIVE
KETONES UR: NEGATIVE mg/dL
LEUKOCYTES UA: NEGATIVE
NITRITE: NEGATIVE
PH: 7 (ref 5.0–8.0)
PROTEIN: 100 mg/dL — AB
SPECIFIC GRAVITY, URINE: 1.011 (ref 1.005–1.030)
Squamous Epithelial / LPF: NONE SEEN

## 2015-11-09 LAB — CBC
HEMATOCRIT: 30.9 % — AB (ref 35.0–47.0)
Hemoglobin: 10.3 g/dL — ABNORMAL LOW (ref 12.0–16.0)
MCH: 31 pg (ref 26.0–34.0)
MCHC: 33.5 g/dL (ref 32.0–36.0)
MCV: 92.4 fL (ref 80.0–100.0)
Platelets: 283 10*3/uL (ref 150–440)
RBC: 3.34 MIL/uL — ABNORMAL LOW (ref 3.80–5.20)
RDW: 15.8 % — AB (ref 11.5–14.5)
WBC: 7.2 10*3/uL (ref 3.6–11.0)

## 2015-11-09 LAB — BRAIN NATRIURETIC PEPTIDE: B Natriuretic Peptide: 355 pg/mL — ABNORMAL HIGH (ref 0.0–100.0)

## 2015-11-09 MED ORDER — ONDANSETRON HCL 4 MG PO TABS: 4.0000 mg | ORAL_TABLET | Freq: Four times a day (QID) | ORAL | Status: DC | PRN

## 2015-11-09 MED ORDER — CARVEDILOL 6.25 MG PO TABS
6.2500 mg | ORAL_TABLET | Freq: Two times a day (BID) | ORAL | Status: DC
  Administered 2015-11-09 – 2015-11-10 (×3): 6.25 mg via ORAL
  Filled 2015-11-09 (×3): qty 1

## 2015-11-09 MED ORDER — ATORVASTATIN CALCIUM 20 MG PO TABS
40.0000 mg | ORAL_TABLET | Freq: Every day | ORAL | Status: DC
  Administered 2015-11-09 – 2015-11-10 (×2): 40 mg via ORAL
  Filled 2015-11-09 (×2): qty 2

## 2015-11-09 MED ORDER — DOCUSATE SODIUM 100 MG PO CAPS
100.0000 mg | ORAL_CAPSULE | Freq: Two times a day (BID) | ORAL | Status: DC
  Administered 2015-11-09 – 2015-11-10 (×2): 100 mg via ORAL
  Filled 2015-11-09 (×2): qty 1

## 2015-11-09 MED ORDER — VITAMIN B-12 1000 MCG PO TABS
1000.0000 ug | ORAL_TABLET | Freq: Every day | ORAL | Status: DC
  Administered 2015-11-10 (×2): 1000 ug via ORAL
  Filled 2015-11-09: qty 1

## 2015-11-09 MED ORDER — PNEUMOCOCCAL VAC POLYVALENT 25 MCG/0.5ML IJ INJ
0.5000 mL | INJECTION | INTRAMUSCULAR | Status: AC
  Administered 2015-11-10: 0.5 mL via INTRAMUSCULAR
  Filled 2015-11-09: qty 0.5

## 2015-11-09 MED ORDER — HEPARIN SODIUM (PORCINE) 5000 UNIT/ML IJ SOLN
5000.0000 [IU] | Freq: Three times a day (TID) | INTRAMUSCULAR | Status: DC
  Administered 2015-11-09 – 2015-11-10 (×3): 5000 [IU] via SUBCUTANEOUS
  Filled 2015-11-09 (×3): qty 1

## 2015-11-09 MED ORDER — ONDANSETRON HCL 4 MG/2ML IJ SOLN: 4.0000 mg | Freq: Four times a day (QID) | INTRAMUSCULAR | Status: DC | PRN

## 2015-11-09 MED ORDER — HYDRALAZINE HCL 20 MG/ML IJ SOLN
INTRAMUSCULAR | Status: AC
  Administered 2015-11-09: 5 mg via INTRAVENOUS
  Filled 2015-11-09: qty 1

## 2015-11-09 MED ORDER — PANTOPRAZOLE SODIUM 40 MG PO TBEC
40.0000 mg | DELAYED_RELEASE_TABLET | Freq: Two times a day (BID) | ORAL | Status: DC
  Administered 2015-11-09 – 2015-11-10 (×2): 40 mg via ORAL
  Filled 2015-11-09 (×2): qty 1

## 2015-11-09 MED ORDER — LEVOTHYROXINE SODIUM 112 MCG PO TABS
112.0000 ug | ORAL_TABLET | Freq: Every day | ORAL | Status: DC
  Administered 2015-11-10: 112 ug via ORAL
  Filled 2015-11-09: qty 1

## 2015-11-09 MED ORDER — CALCIUM CITRATE-VITAMIN D 500-400 MG-UNIT PO CHEW
1.0000 | CHEWABLE_TABLET | Freq: Two times a day (BID) | ORAL | Status: DC
  Administered 2015-11-09 – 2015-11-10 (×2): 1 via ORAL
  Filled 2015-11-09 (×2): qty 1

## 2015-11-09 MED ORDER — ADULT MULTIVITAMIN W/MINERALS CH
1.0000 | ORAL_TABLET | Freq: Every day | ORAL | Status: DC
  Administered 2015-11-09 – 2015-11-10 (×2): 1 via ORAL
  Filled 2015-11-09 (×2): qty 1

## 2015-11-09 MED ORDER — HYDRALAZINE HCL 20 MG/ML IJ SOLN
5.0000 mg | Freq: Once | INTRAMUSCULAR | Status: AC
Start: 2015-11-09 — End: 2015-11-09
  Administered 2015-11-09: 5 mg via INTRAVENOUS

## 2015-11-09 MED ORDER — SODIUM BICARBONATE 650 MG PO TABS
650.0000 mg | ORAL_TABLET | Freq: Two times a day (BID) | ORAL | Status: DC
  Administered 2015-11-09 – 2015-11-10 (×2): 650 mg via ORAL
  Filled 2015-11-09 (×2): qty 1

## 2015-11-09 MED ORDER — SENNOSIDES-DOCUSATE SODIUM 8.6-50 MG PO TABS: 1.0000 | ORAL_TABLET | Freq: Every evening | ORAL | Status: DC | PRN

## 2015-11-09 MED ORDER — ACETAMINOPHEN 325 MG PO TABS: 650.0000 mg | ORAL_TABLET | Freq: Four times a day (QID) | ORAL | Status: DC | PRN

## 2015-11-09 MED ORDER — ACETAMINOPHEN 650 MG RE SUPP: 650.0000 mg | Freq: Four times a day (QID) | RECTAL | Status: DC | PRN

## 2015-11-09 MED ORDER — OMEGA-3-ACID ETHYL ESTERS 1 G PO CAPS
1.0000 g | ORAL_CAPSULE | Freq: Every day | ORAL | Status: DC
  Administered 2015-11-09 – 2015-11-10 (×2): 1 g via ORAL
  Filled 2015-11-09 (×2): qty 1

## 2015-11-09 MED ORDER — AMLODIPINE BESYLATE 5 MG PO TABS
5.0000 mg | ORAL_TABLET | Freq: Every day | ORAL | Status: DC
  Administered 2015-11-09 – 2015-11-10 (×2): 5 mg via ORAL
  Filled 2015-11-09 (×2): qty 1

## 2015-11-09 MED ORDER — HYDRALAZINE HCL 25 MG PO TABS
25.0000 mg | ORAL_TABLET | Freq: Four times a day (QID) | ORAL | Status: DC
  Administered 2015-11-09 – 2015-11-10 (×3): 25 mg via ORAL
  Filled 2015-11-09 (×3): qty 1

## 2015-11-09 MED ORDER — FOLIC ACID 1 MG PO TABS
1000.0000 ug | ORAL_TABLET | Freq: Every day | ORAL | Status: DC
  Administered 2015-11-09 – 2015-11-10 (×2): 1 mg via ORAL
  Filled 2015-11-09 (×2): qty 1

## 2015-11-09 MED ORDER — ISOSORBIDE MONONITRATE ER 30 MG PO TB24
30.0000 mg | ORAL_TABLET | Freq: Every day | ORAL | Status: DC
  Administered 2015-11-09 – 2015-11-10 (×2): 30 mg via ORAL
  Filled 2015-11-09 (×2): qty 1

## 2015-11-09 MED ORDER — FERROUS GLUCONATE 324 (38 FE) MG PO TABS
324.0000 mg | ORAL_TABLET | Freq: Three times a day (TID) | ORAL | Status: DC
  Administered 2015-11-10 (×2): 324 mg via ORAL
  Filled 2015-11-09 (×5): qty 1

## 2015-11-09 MED ORDER — HYDRALAZINE HCL 20 MG/ML IJ SOLN
10.0000 mg | Freq: Four times a day (QID) | INTRAMUSCULAR | Status: DC | PRN
  Administered 2015-11-09 – 2015-11-10 (×2): 10 mg via INTRAVENOUS
  Filled 2015-11-09 (×2): qty 1

## 2015-11-09 NOTE — H&P (Signed)
[facility] - [place] at [facility]   PATIENT NAME: Melissa Bright    MR#:  [id number]  DATE OF BIRTH:  09-09-1923  DATE OF ADMISSION:  11/09/2015  PRIMARY CARE PHYSICIAN: Marisue Ivan, MD   REQUESTING/REFERRING PHYSICIAN: Dr. Cyril Loosen  CHIEF COMPLAINT:   Chief Complaint  Patient presents with  . Shortness of Breath  . Chest Pain    HISTORY OF PRESENT ILLNESS:  Melissa Bright  is a 82 y.o. female with a known history of essential hypertension, chronic kidney disease stage III, history of systolic CHF, history of previous MI, history of previous CVA who presents to the hospital due to shortness of breath and noted to have accelerated hypertension. Patient lives by herself and has her daughters take care of her extensively. As per the daughter as patient was having some shortness of breath early this morning and therefore was brought to the ER for further evaluation. Emergency room patient received some Solu-Medrol and also a breathing treatment and her shortness of breath since that is improved. She was never truly hypoxic. She was noted to be significantly hypertensive with systolic blood pressures greater than 200. As per the family patient's blood pressure has been labile even at home and she has been compliant with her medications.  She also complained of some vague chest pain which has resolved now. She denies any nausea, vomiting, diarrhea, headache, fever, chills or any other associated symptoms presently. Hospitalist services were contacted for further treatment and evaluation.    PAST MEDICAL HISTORY:   Past Medical History  Diagnosis Date  . Coronary artery disease   . Arthritis   . Blood transfusion without reported diagnosis   . Hypertension   . Renal disorder   . CHF (congestive heart failure) (HCC)   . Stroke [facility])     PAST SURGICAL HISTORY:   Past Surgical History  Procedure Laterality Date  . Joint replacement    . Abdominal hysterectomy     . Cardiac surgery    . Eye surgery      SOCIAL HISTORY:   Social History  Substance Use Topics  . Smoking status: Former Games developer  . Smokeless tobacco: Not on file  . Alcohol Use: No    FAMILY HISTORY:   Family History  Problem Relation Age of Onset  . Heart attack Father     DRUG ALLERGIES:   Allergies  Allergen Reactions  . Macrobid [employer] Macro]     REVIEW OF SYSTEMS:   Review of Systems  Constitutional: Negative for fever and weight loss.  HENT: Negative for congestion, nosebleeds and tinnitus.   Eyes: Negative for blurred vision, double vision and redness.  Respiratory: Positive for shortness of breath. Negative for cough and hemoptysis.   Cardiovascular: Negative for chest pain, orthopnea, leg swelling and PND.  Gastrointestinal: Negative for nausea, vomiting, abdominal pain, diarrhea and melena.  Genitourinary: Negative for dysuria, urgency and hematuria.  Musculoskeletal: Negative for joint pain and falls.  Neurological: Negative for dizziness, tingling, sensory change, focal weakness, seizures, weakness and headaches.  Endo/Heme/Allergies: Negative for polydipsia. Does not bruise/bleed easily.  Psychiatric/Behavioral: Negative for depression and memory loss. The patient is not nervous/anxious.     MEDICATIONS AT HOME:   Prior to Admission medications   Medication Sig Start Date End Date Taking? Authorizing Provider  amLODipine (NORVASC) 5 MG tablet Take 5 mg by mouth daily.   Yes Historical Provider, MD  atorvastatin (LIPITOR) 40 MG tablet Take 40 mg by mouth daily.  Yes Historical Provider, MD  calcium citrate-vitamin D 500-400 MG-UNIT chewable tablet Chew 1 tablet by mouth 2 (two) times daily.   Yes Historical Provider, MD  carvedilol (COREG) 6.25 MG tablet Take 3.125 mg by mouth 2 (two) times daily.   Yes Historical Provider, MD  docusate sodium (COLACE) 100 MG capsule Take 100 mg by mouth 2 (two) times daily.   Yes Historical  Provider, MD  ferrous gluconate (FERGON) 240 (27 FE) MG tablet Take 240 mg by mouth 3 (three) times daily with meals.   Yes Historical Provider, MD  folic acid (FOLVITE) 800 MCG tablet Take 800 mcg by mouth daily.   Yes Historical Provider, MD  hydrALAZINE (APRESOLINE) 50 MG tablet Take 25 mg by mouth 2 (two) times daily as needed. Take if SBP is greater than 180 and/or DBP is greater than 110   Yes Historical Provider, MD  isosorbide mononitrate (IMDUR) 30 MG 24 hr tablet Take 30 mg by mouth daily.   Yes Historical Provider, MD  levothyroxine (SYNTHROID, LEVOTHROID) 112 MCG tablet Take 112 mcg by mouth daily.   Yes Historical Provider, MD  Multiple Vitamin (MULTIVITAMIN WITH MINERALS) TABS tablet Take 1 tablet by mouth daily.   Yes Historical Provider, MD  omega-3 acid ethyl esters (LOVAZA) 1 g capsule Take 1 g by mouth daily.   Yes Historical Provider, MD  pantoprazole (PROTONIX) 40 MG tablet Take 40 mg by mouth 2 (two) times daily.   Yes Historical Provider, MD  senna-docusate (SENOKOT-S) 8.6-50 MG tablet Take 1 tablet by mouth at bedtime as needed for mild constipation.   Yes Historical Provider, MD  sodium bicarbonate 650 MG tablet Take 650 mg by mouth 2 (two) times daily.   Yes Historical Provider, MD  vitamin B-12 (CYANOCOBALAMIN) 1000 MCG tablet Take 1,000 mcg by mouth daily.   Yes Historical Provider, MD      VITAL SIGNS:  Blood pressure 184/66, pulse 93, temperature 98 F (36.7 C), temperature source Oral, resp. rate 20, SpO2 97 %.  PHYSICAL EXAMINATION:  Physical Exam  GENERAL:  82 y.o.-year-old patient lying in the bed in no acute distress.  EYES: Pupils equal, round, reactive to light and accommodation. No scleral icterus. Extraocular muscles intact.  HEENT: Head atraumatic, normocephalic. Oropharynx and nasopharynx clear. No oropharyngeal erythema, moist oral mucosa  NECK:  Supple, no jugular venous distention. No thyroid enlargement, no tenderness.  LUNGS: Normal breath  sounds bilaterally, no wheezing, rales, rhonchi. No use of accessory muscles of respiration.  CARDIOVASCULAR: S1, S2 RRR. No murmurs, rubs, gallops, clicks.  ABDOMEN: Soft, nontender, nondistended. Bowel sounds present. No organomegaly or mass.  EXTREMITIES: +1-2 dependent edema b/l, no cyanosis, or clubbing. + 2 pedal & radial pulses b/l.   NEUROLOGIC: Cranial nerves II through XII are intact. No focal Motor or sensory deficits appreciated b/l.  Globally weak PSYCHIATRIC: The patient is alert and oriented x 3. SKIN: No obvious rash, lesion, or ulcer.   LABORATORY PANEL:   CBC  Recent Labs Lab 11/09/15 0925  WBC 7.2  HGB 10.3*  HCT 30.9*  PLT 283   ------------------------------------------------------------------------------------------------------------------  Chemistries   Recent Labs Lab 11/09/15 0925  NA 142  K 4.3  CL 107  CO2 26  GLUCOSE 133*  BUN 40*  CREATININE 2.11*  CALCIUM 9.9  AST 23  ALT 12*  ALKPHOS 93  BILITOT 0.4   ------------------------------------------------------------------------------------------------------------------  Cardiac Enzymes  Recent Labs Lab 11/09/15 0925  TROPONINI 0.03   ------------------------------------------------------------------------------------------------------------------  RADIOLOGY:  Dg Chest Portable  1 View  11/09/2015  CLINICAL DATA:  Shortness of breath and chest pain. Personal history of coronary artery disease. EXAM: PORTABLE CHEST - 1 VIEW COMPARISON:  One-view chest x-ray 12/27/2014. FINDINGS: The heart size is normal. The lung volumes are low. Elevation of left hemidiaphragm is chronic. Chronic interstitial coarsening is again noted. A stent in the LAD is again noted. Atherosclerotic changes are present at the aortic arch. A right axillary stent is again noted. IMPRESSION: 1. No acute cardiopulmonary disease or significant interval change. Electronically Signed   By: Marin Robertshristopher  Mattern M.D.   On:  11/09/2015 10:18     IMPRESSION AND PLAN:   82 year old female with past medical history of hypertension, chronic kidney disease stage III, history of previous CVA, history of previous MI, history of systolic CHF who presents to the hospital due to shortness of breath and noted to have accelerated hypertension.  #1 accelerated hypertension-patient's blood pressures have been labile at home. -No evidence of acute end organ damage presently. Continue Norvasc, I will increase her Coreg, also increase her hydralazine, add some as needed IV hydralazine. Continue Imdur. -Follow hemodynamics.  #2 shortness of breath-suspected to be related to underlying accelerated hypertension. She is clinically not in congestive heart failure, her chest x-ray is negative for any acute pathology. She is not hypoxic. We'll monitor.  #3 hypothyroidism-continue Synthroid.  #4 history of coronary artery disease-continue beta blocker, statin, Imdur. No acute chest pain presently.  #5 GERD-continue Protonix.  #6 hyperlipidemia-continue atorvastatin.   All the records are reviewed and case discussed with ED provider. Management plans discussed with the patient, family and they are in agreement.  CODE STATUS: DNR  TOTAL TIME TAKING CARE OF THIS PATIENT: 45 minutes.    [place] SirenSAINANI,Krystyl Cannell J M.D on 11/09/2015 at 1:43 PM  Between 7am to 6pm - Pager - [phone]  After 6pm go to [email or web address] - password [email or web address] [facility]  [facility]  Office  [phone]  CC: Primary care physician; [name], [name], MD

## 2015-11-09 NOTE — ED Notes (Signed)
Patient's O2 decreased to 2 Liter via [place]

## 2015-11-09 NOTE — ED Notes (Signed)
Pt given lunch tray with cola

## 2015-11-09 NOTE — ED Provider Notes (Signed)
[facility] Emergency Department Provider Note  ____________________________________________    I have reviewed the triage vital signs and the nursing notes.   HISTORY  Chief Complaint Shortness of Breath and Chest Pain    HPI Melissa Bright is a 82 y.o. female who presents with complaints of shortness of breath. She complains of chest tightness as well. She reports over the last several days she has had more and more difficulty breathing. She reports smoking in the distant history. She denies fevers or chills. No cough. No diaphoresis.     Past Medical History  Diagnosis Date  . Coronary artery disease   . Arthritis   . Blood transfusion without reported diagnosis   . Hypertension   . Renal disorder   . CHF (congestive heart failure) (HCC)   . Stroke [facility])     There are no active problems to display for this patient.   Past Surgical History  Procedure Laterality Date  . Joint replacement    . Abdominal hysterectomy    . Cardiac surgery    . Eye surgery      Current Outpatient Rx  Name  Route  Sig  Dispense  Refill  . amLODipine (NORVASC) 10 MG tablet      TAKE ONE (1) TABLET EACH DAY   90 tablet   0   . carvedilol (COREG) 6.25 MG tablet      TAKE ONE TABLET TWICE DAILY   60 tablet   0     Allergies Macrobid  No family history on file.  Social History Social History  Substance Use Topics  . Smoking status: Former Games developer  . Smokeless tobacco: None  . Alcohol Use: No    Review of Systems  Constitutional: Negative for fever.  ENT: Negative for sore throat Cardiovascular: As above Respiratory: As above. Gastrointestinal: Negative for abdominal pain, Genitourinary: Negative for dysuria. Musculoskeletal: Negative for calf pain Skin: Negative for rash. Neurological: Negative for headaches or dizziness Psychiatric: No anxiety    ____________________________________________   PHYSICAL EXAM:  VITAL SIGNS: ED  Triage Vitals  Enc Vitals Group     BP 11/09/15 0921 178/111 mmHg     Pulse Rate 11/09/15 0921 103     Resp 11/09/15 0921 14     Temp 11/09/15 0921 98 F (36.7 C)     Temp Source 11/09/15 0921 Oral     SpO2 11/09/15 0921 100 %     Weight --      Height --      Head Cir --      Peak Flow --      Pain Score --      Pain Loc --      Pain Edu? --      Excl. in GC? --      Constitutional: Alert and oriented. No acute distress Eyes: Conjunctivae are normal.  ENT   Head: Normocephalic and atraumatic.   Mouth/Throat: Mucous membranes are moist. Cardiovascular: Tachycardia regular rhythm. Normal and symmetric distal pulses are present in all extremities.  Respiratory: Scattered Rales bibasilarly, scattered wheezes throughout, increased respiratory effort Gastrointestinal: Soft and non-tender in all quadrants. No distention. There is no CVA tenderness. Genitourinary: deferred Musculoskeletal: Nontender with normal range of motion in all extremities. No lower extremity tenderness  Neurologic:  Normal speech and language. No gross focal neurologic deficits are appreciated. Skin:  Skin is warm, dry and intact. No rash noted. Psychiatric: Mood and affect are normal. Patient exhibits appropriate insight  and judgment.  ____________________________________________    LABS (pertinent positives/negatives)  Labs Reviewed  CBC  COMPREHENSIVE METABOLIC PANEL  TROPONIN I  BRAIN NATRIURETIC PEPTIDE    ____________________________________________   EKG  ED ECG REPORT I, Jene EveryKINNER, Trevis Eden, the attending physician, personally viewed and interpreted this ECG.   Date: 11/09/2015  EKG Time: 9:22 AM  Rate: 103  Rhythm: sinus tachycardia  Axis: Normal  Intervals:Prolonged QT  ST&T Change: Nonspecific   ____________________________________________    RADIOLOGY I have personally reviewed any xrays that were ordered on this patient: Chest x-ray  unremarkable  ____________________________________________   PROCEDURES  Procedure(s) performed: none  Critical Care performed: none  ____________________________________________   INITIAL IMPRESSION / ASSESSMENT AND PLAN / ED COURSE  Pertinent labs & imaging results that were available during my care of the patient were reviewed by me and considered in my medical decision making (see chart for details).  Patient presents with increased work of breathing, tachycardia and shortness of breath. She is hypertensive and has a history of CHF. Differential diagnosis includes pulmonary edema, pneumonia, bronchospasm, CAD/ACS. EMS gave slight Medrol and DuoNeb with some improvement. They also placed the patient on nitro paste for her high blood pressure  Patient with continued high blood pressure, hydralazine 5 mg given IV.  Consulted internal medicine for consideration for admission  ____________________________________________   FINAL CLINICAL IMPRESSION(S) / ED DIAGNOSES  Final diagnoses:  Accelerated hypertension  Shortness of breath     Jene Everyobert Honestii Marton, MD 11/09/15 1340

## 2015-11-09 NOTE — Care Management Obs Status (Signed)
MEDICARE OBSERVATION STATUS NOTIFICATION   Patient Details  Name: Melissa Bright MRN: [id number] Date of Birth: 11/27/1923   Medicare Observation Status Notification Given:  Yes (hypertension)    Caren MacadamMichelle Traquan Duarte, RN 11/09/2015, 1:51 PM

## 2015-11-09 NOTE — ED Notes (Signed)
Admitting MD at bedside.

## 2015-11-09 NOTE — ED Notes (Signed)
[facility]/o Shortness of breath and chest pain. Per EMS patient was 94% on room air, was given a breathing treatment of duoneb and albuterol. Patient also received Solu-medrol 125 mg. Patient was hypertensive at 200/102, EMS applied 2 inches of nitro paste to the left upper chest .

## 2015-11-09 NOTE — ED Notes (Signed)
Patient O2 turned off per verbal from Dr. Cyril LoosenKinner

## 2015-11-10 DIAGNOSIS — I10 Essential (primary) hypertension: Secondary | ICD-10-CM | POA: Diagnosis not present

## 2015-11-10 DIAGNOSIS — R0602 Shortness of breath: Secondary | ICD-10-CM | POA: Diagnosis not present

## 2015-11-10 DIAGNOSIS — I5022 Chronic systolic (congestive) heart failure: Secondary | ICD-10-CM | POA: Diagnosis not present

## 2015-11-10 DIAGNOSIS — E039 Hypothyroidism, unspecified: Secondary | ICD-10-CM | POA: Diagnosis not present

## 2015-11-10 LAB — CBC
HCT: 22.6 % — ABNORMAL LOW (ref 35.0–47.0)
HEMATOCRIT: 25.8 % — AB (ref 35.0–47.0)
HEMOGLOBIN: 7.4 g/dL — AB (ref 12.0–16.0)
HEMOGLOBIN: 8.4 g/dL — AB (ref 12.0–16.0)
MCH: 30 pg (ref 26.0–34.0)
MCH: 30 pg (ref 26.0–34.0)
MCHC: 32.8 g/dL (ref 32.0–36.0)
MCHC: 32.6 g/dL (ref 32.0–36.0)
MCV: 91.4 fL (ref 80.0–100.0)
MCV: 92.1 fL (ref 80.0–100.0)
PLATELETS: 230 10*3/uL (ref 150–440)
Platelets: 284 10*3/uL (ref 150–440)
RBC: 2.47 MIL/uL — AB (ref 3.80–5.20)
RBC: 2.8 MIL/uL — AB (ref 3.80–5.20)
RDW: 15.7 % — ABNORMAL HIGH (ref 11.5–14.5)
RDW: 15.7 % — AB (ref 11.5–14.5)
WBC: 5.7 10*3/uL (ref 3.6–11.0)
WBC: 7.7 10*3/uL (ref 3.6–11.0)

## 2015-11-10 LAB — BASIC METABOLIC PANEL
ANION GAP: 3 — AB (ref 5–15)
BUN: 45 mg/dL — ABNORMAL HIGH (ref 6–20)
CALCIUM: 8.8 mg/dL — AB (ref 8.9–10.3)
CO2: 26 mmol/L (ref 22–32)
Chloride: 112 mmol/L — ABNORMAL HIGH (ref 101–111)
Creatinine, Ser: 2.18 mg/dL — ABNORMAL HIGH (ref 0.44–1.00)
GFR calc non Af Amer: 19 mL/min — ABNORMAL LOW (ref >60)
GFR, EST AFRICAN AMERICAN: 22 mL/min — AB (ref >60)
Glucose, Bld: 118 mg/dL — ABNORMAL HIGH (ref 65–99)
Potassium: 4 mmol/L (ref 3.5–5.1)
SODIUM: 141 mmol/L (ref 135–145)

## 2015-11-10 MED ORDER — CARVEDILOL 6.25 MG PO TABS: 6.2500 mg | ORAL_TABLET | Freq: Two times a day (BID) | ORAL | Status: DC

## 2015-11-10 NOTE — Discharge Summary (Signed)
[facility] - [place] at [facility]   PATIENT NAME: Melissa Bright    MR#:  [id number]  DATE OF BIRTH:  11/30/1923  DATE OF ADMISSION:  11/09/2015 ADMITTING PHYSICIAN: Houston SirenVivek J Sainani, MD  DATE OF DISCHARGE: 11/10/2015  PRIMARY CARE PHYSICIAN: [name], [name], MD    ADMISSION DIAGNOSIS:  Shortness of breath [R06.02] Accelerated hypertension [I10]  DISCHARGE DIAGNOSIS:  Active Problems:   Accelerated hypertension   SECONDARY DIAGNOSIS:   Past Medical History  Diagnosis Date  . Coronary artery disease   . Arthritis   . Blood transfusion without reported diagnosis   . Hypertension   . Renal disorder   . CHF (congestive heart failure) (HCC)   . Stroke [facility](HCC)     HOSPITAL COURSE:   #1 accelerated hypertension-patient's blood pressures have been labile at home. -No evidence of acute end organ damage presently. Continue Norvasc, I will increase her Coreg,  Continue Imdur. -Follow hemodynamics. - BP stable, she is on oral hydralazine PRN at home- daughter checks her BP and gives it , if needed.  #2 shortness of breath-suspected to be related to underlying accelerated hypertension. She is clinically not in congestive heart failure, her chest x-ray is negative for any acute pathology. She is not hypoxic.   stable now.  #3 hypothyroidism-continue Synthroid.  #4 history of coronary artery disease-continue beta blocker, statin, Imdur. No acute chest pain presently.  #5 GERD-continue Protonix.  #6 hyperlipidemia-continue atorvastatin  # 7 anemia of chronic disease.     Her Hb is always around 7 and daughter knows that.     Yesterday in ER was >10,  ( may be mistake) , no blood loss, and today hb is around 7.     Explained the daughter- about possible wrong reading yesterday. And advised to follow with PMD in 1-2 weeks.  DISCHARGE CONDITIONS:   Stable.  CONSULTS OBTAINED:     DRUG ALLERGIES:   Allergies  Allergen Reactions  . Macrobid  [Nitrofurantoin Monohyd Macro]     DISCHARGE MEDICATIONS:   Current Discharge Medication List    CONTINUE these medications which have CHANGED   Details  carvedilol (COREG) 6.25 MG tablet Take 1 tablet (6.25 mg total) by mouth 2 (two) times daily. Qty: 60 tablet, Refills: 0      CONTINUE these medications which have NOT CHANGED   Details  amLODipine (NORVASC) 5 MG tablet Take 5 mg by mouth daily.    atorvastatin (LIPITOR) 40 MG tablet Take 40 mg by mouth daily.    calcium citrate-vitamin D 500-400 MG-UNIT chewable tablet Chew 1 tablet by mouth 2 (two) times daily.    docusate sodium (COLACE) 100 MG capsule Take 100 mg by mouth 2 (two) times daily.    ferrous gluconate (FERGON) 240 (27 FE) MG tablet Take 240 mg by mouth 3 (three) times daily with meals.    folic acid (FOLVITE) 800 MCG tablet Take 800 mcg by mouth daily.    hydrALAZINE (APRESOLINE) 50 MG tablet Take 25 mg by mouth 2 (two) times daily as needed. Take if SBP is greater than 180 and/or DBP is greater than 110    isosorbide mononitrate (IMDUR) 30 MG 24 hr tablet Take 30 mg by mouth daily.    levothyroxine (SYNTHROID, LEVOTHROID) 112 MCG tablet Take 112 mcg by mouth daily.    Multiple Vitamin (MULTIVITAMIN WITH MINERALS) TABS tablet Take 1 tablet by mouth daily.    omega-3 acid ethyl esters (LOVAZA) 1 g capsule Take 1 g by  mouth daily.    pantoprazole (PROTONIX) 40 MG tablet Take 40 mg by mouth 2 (two) times daily.    senna-docusate (SENOKOT-S) 8.6-50 MG tablet Take 1 tablet by mouth at bedtime as needed for mild constipation.    sodium bicarbonate 650 MG tablet Take 650 mg by mouth 2 (two) times daily.    vitamin B-12 (CYANOCOBALAMIN) 1000 MCG tablet Take 1,000 mcg by mouth daily.         DISCHARGE INSTRUCTIONS:    Follow with PMD in 1-2 weeks.  If you experience worsening of your admission symptoms, develop shortness of breath, life threatening emergency, suicidal or homicidal thoughts you must seek  medical attention immediately by calling 911 or calling your MD immediately  if symptoms less severe.  You Must read complete instructions/literature along with all the possible adverse reactions/side effects for all the Medicines you take and that have been prescribed to you. Take any new Medicines after you have completely understood and accept all the possible adverse reactions/side effects.   Please note  You were cared for by a hospitalist during your hospital stay. If you have any questions about your discharge medications or the care you received while you were in the hospital after you are discharged, you can call the unit and asked to speak with the hospitalist on call if the hospitalist that took care of you is not available. Once you are discharged, your primary care physician will handle any further medical issues. Please note that NO REFILLS for any discharge medications will be authorized once you are discharged, as it is imperative that you return to your primary care physician (or establish a relationship with a primary care physician if you do not have one) for your aftercare needs so that they can reassess your need for medications and monitor your lab values.    Today   CHIEF COMPLAINT:   Chief Complaint  Patient presents with  . Shortness of Breath  . Chest Pain    HISTORY OF PRESENT ILLNESS:  Melissa Bright  is a 82 y.o. female with a known history of essential hypertension, chronic kidney disease stage III, history of systolic CHF, history of previous MI, history of previous CVA who presents to the hospital due to shortness of breath and noted to have accelerated hypertension. Patient lives by herself and has her daughters take care of her extensively. As per the daughter as patient was having some shortness of breath early this morning and therefore was brought to the ER for further evaluation. Emergency room patient received some Solu-Medrol and also a breathing treatment  and her shortness of breath since that is improved. She was never truly hypoxic. She was noted to be significantly hypertensive with systolic blood pressures greater than 200. As per the family patient's blood pressure has been labile even at home and she has been compliant with her medications. She also complained of some vague chest pain which has resolved now. She denies any nausea, vomiting, diarrhea, headache, fever, chills or any other associated symptoms presently. Hospitalist services were contacted for further treatment and evaluation.    VITAL SIGNS:  Blood pressure 129/48, pulse 72, temperature 98.2 F (36.8 C), temperature source Oral, resp. rate 16, height 5\' 3"  (1.6 m), weight 73.165 kg (161 lb 4.8 oz), SpO2 97 %.  I/O:   Intake/Output Summary (Last 24 hours) at 11/10/15 1221 Last data filed at 11/10/15 0943  Gross per 24 hour  Intake    480 ml  Output  0 ml  Net    480 ml    PHYSICAL EXAMINATION:   GENERAL: 82 y.o.-year-old patient lying in the bed in no acute distress.  EYES: Pupils equal, round, reactive to light and accommodation. No scleral icterus. Extraocular muscles intact.  HEENT: Head atraumatic, normocephalic. Oropharynx and nasopharynx clear. No oropharyngeal erythema, moist oral mucosa  NECK: Supple, no jugular venous distention. No thyroid enlargement, no tenderness.  LUNGS: Normal breath sounds bilaterally, no wheezing, rales, rhonchi. No use of accessory muscles of respiration.  CARDIOVASCULAR: S1, S2 RRR. No murmurs, rubs, gallops, clicks.  ABDOMEN: Soft, nontender, nondistended. Bowel sounds present. No organomegaly or mass.  EXTREMITIES: +1-2 dependent edema b/l, no cyanosis, or clubbing. + 2 pedal & radial pulses b/l.  NEUROLOGIC: Cranial nerves II through XII are intact. No focal Motor or sensory deficits appreciated b/l. Globally weak PSYCHIATRIC: The patient is alert and oriented x 3. SKIN: No obvious rash, lesion, or ulcer.   DATA  REVIEW:   CBC  Recent Labs Lab 11/10/15 0401  WBC 5.7  HGB 7.4*  HCT 22.6*  PLT 230    Chemistries   Recent Labs Lab 11/09/15 0925 11/10/15 0401  NA 142 141  K 4.3 4.0  CL 107 112*  CO2 26 26  GLUCOSE 133* 118*  BUN 40* 45*  CREATININE 2.11* 2.18*  CALCIUM 9.9 8.8*  AST 23  --   ALT 12*  --   ALKPHOS 93  --   BILITOT 0.4  --     Cardiac Enzymes  Recent Labs Lab 11/09/15 0925  TROPONINI 0.03    Microbiology Results  Results for orders placed or performed in visit on 08/30/14  Urine culture     Status: None   Collection Time: 08/30/14  6:06 PM  Result Value Ref Range Status   Micro Text Report   Final       SOURCE: CLEAN CATCH    ORGANISM 1                >100,000 CFU/ML Enterococcus faecalis   COMMENT                   -   ANTIBIOTIC                    ORG#1     AMPICILLIN                    S         CIPROFLOXACIN                 S         LEVOFLOXACIN                  S         LINEZOLID                     S         NITROFURANTOIN                S         TETRACYCLINE                  S             RADIOLOGY:  Dg Chest Portable 1 View  11/09/2015  CLINICAL DATA:  Shortness of breath and chest pain. Personal history of coronary artery disease. EXAM: PORTABLE CHEST - 1 VIEW COMPARISON:  One-view chest x-ray 12/27/2014. FINDINGS: The heart size is normal. The lung volumes are low. Elevation of left hemidiaphragm is chronic. Chronic interstitial coarsening is again noted. A stent in the LAD is again noted. Atherosclerotic changes are present at the aortic arch. A right axillary stent is again noted. IMPRESSION: 1. No acute cardiopulmonary disease or significant interval change. Electronically Signed   By: Marin Roberts M.D.   On: 11/09/2015 10:18    EKG:   Orders placed or performed during the hospital encounter of 11/09/15  . EKG 12-Lead  . EKG 12-Lead  . ED EKG  . ED EKG      Management plans discussed with the patient, family and  they are in agreement.  CODE STATUS: DNR    Code Status Orders        Start     Ordered   11/09/15 1627  Do not attempt resuscitation (DNR)   Continuous    Question Answer Comment  In the event of cardiac or respiratory ARREST Do not call a "code blue"   In the event of cardiac or respiratory ARREST Do not perform Intubation, CPR, defibrillation or ACLS   In the event of cardiac or respiratory ARREST Use medication by any route, position, wound care, and other measures to relive pain and suffering. May use oxygen, suction and manual treatment of airway obstruction as needed for comfort.      11/09/15 1626    Code Status History    Date Active Date Inactive Code Status Order ID Comments User Context   This patient has a current code status but no historical code status.    Advance Directive Documentation        Most Recent Value   Type of Advance Directive  Healthcare Power of Attorney, Out of facility DNR (pink MOST or yellow form)   Pre-existing out of facility DNR order (yellow form or pink MOST form)     "MOST" Form in Place?        TOTAL TIME TAKING CARE OF THIS PATIENT: 35 minutes.    Altamese Dilling M.D on 11/10/2015 at 12:21 PM  Between 7am to 6pm - Pager - [phone]  After 6pm go to [email or web address] - password [email or web address] [facility]  [facility]  Office  [phone]  CC: Primary care physician; Marisue Ivan, MD   Note: This dictation was prepared with Dragon dictation along with smaller phrase technology. Any transcriptional errors that result from this process are unintentional.

## 2015-11-10 NOTE — Progress Notes (Addendum)
Paged and spoke to Dr. Elisabeth PigeonVachhani regarding pt bp and giving pt hydralazine. He said to hold this dose.

## 2015-11-10 NOTE — Progress Notes (Signed)
Instructions and prescriptions given to patient. Verbalized understanding. Waiting on family to transport home.  

## 2015-11-10 NOTE — Care Management Note (Signed)
Case Management Note  Patient Details  Name: Melissa Bright MRN: [id number] Date of Birth: 05/26/1924  Subjective/Objective:       Discharged today. No home health orders.              Action/Plan:   Expected Discharge Date:                  Expected Discharge Plan:     In-House Referral:     Discharge planning Services     Post Acute Care Choice:    Choice offered to:     DME Arranged:    DME Agency:     HH Arranged:    HH Agency:     Status of Service:     Medicare Important Message Given:    Date Medicare IM Given:    Medicare IM give by:    Date Additional Medicare IM Given:    Additional Medicare Important Message give by:     If discussed at Long Length of Stay Meetings, dates discussed:    Additional Comments:  Jaylee Freeze A, RN 11/10/2015, 2:02 PM

## 2015-11-13 DIAGNOSIS — N184 Chronic kidney disease, stage 4 (severe): Secondary | ICD-10-CM | POA: Diagnosis not present

## 2015-11-13 DIAGNOSIS — D520 Dietary folate deficiency anemia: Secondary | ICD-10-CM | POA: Diagnosis not present

## 2015-11-13 DIAGNOSIS — I10 Essential (primary) hypertension: Secondary | ICD-10-CM | POA: Diagnosis not present

## 2015-11-13 DIAGNOSIS — Z8709 Personal history of other diseases of the respiratory system: Secondary | ICD-10-CM | POA: Diagnosis not present

## 2015-11-20 ENCOUNTER — Emergency Department: Payer: Commercial Managed Care - HMO

## 2015-11-20 ENCOUNTER — Encounter: Payer: Self-pay | Admitting: Emergency Medicine

## 2015-11-20 ENCOUNTER — Inpatient Hospital Stay
Admission: EM | Admit: 2015-11-20 | Discharge: 2015-11-22 | DRG: 190 | Disposition: A | Discharge status: Hospice | Payer: Commercial Managed Care - HMO | Attending: Internal Medicine | Admitting: Internal Medicine

## 2015-11-20 DIAGNOSIS — R0600 Dyspnea, unspecified: Secondary | ICD-10-CM

## 2015-11-20 DIAGNOSIS — J9621 Acute and chronic respiratory failure with hypoxia: Secondary | ICD-10-CM | POA: Diagnosis not present

## 2015-11-20 DIAGNOSIS — J9622 Acute and chronic respiratory failure with hypercapnia: Secondary | ICD-10-CM | POA: Diagnosis present

## 2015-11-20 DIAGNOSIS — I10 Essential (primary) hypertension: Secondary | ICD-10-CM

## 2015-11-20 DIAGNOSIS — Z87891 Personal history of nicotine dependence: Secondary | ICD-10-CM | POA: Diagnosis not present

## 2015-11-20 DIAGNOSIS — J181 Lobar pneumonia, unspecified organism: Secondary | ICD-10-CM

## 2015-11-20 DIAGNOSIS — I130 Hypertensive heart and chronic kidney disease with heart failure and stage 1 through stage 4 chronic kidney disease, or unspecified chronic kidney disease: Secondary | ICD-10-CM | POA: Diagnosis not present

## 2015-11-20 DIAGNOSIS — D72829 Elevated white blood cell count, unspecified: Secondary | ICD-10-CM

## 2015-11-20 DIAGNOSIS — Z66 Do not resuscitate: Secondary | ICD-10-CM | POA: Diagnosis present

## 2015-11-20 DIAGNOSIS — J441 Chronic obstructive pulmonary disease with (acute) exacerbation: Secondary | ICD-10-CM | POA: Diagnosis not present

## 2015-11-20 DIAGNOSIS — J9691 Respiratory failure, unspecified with hypoxia: Secondary | ICD-10-CM

## 2015-11-20 DIAGNOSIS — E538 Deficiency of other specified B group vitamins: Secondary | ICD-10-CM | POA: Diagnosis present

## 2015-11-20 DIAGNOSIS — I509 Heart failure, unspecified: Secondary | ICD-10-CM

## 2015-11-20 DIAGNOSIS — D638 Anemia in other chronic diseases classified elsewhere: Secondary | ICD-10-CM | POA: Diagnosis present

## 2015-11-20 DIAGNOSIS — I739 Peripheral vascular disease, unspecified: Secondary | ICD-10-CM | POA: Diagnosis present

## 2015-11-20 DIAGNOSIS — Z882 Allergy status to sulfonamides status: Secondary | ICD-10-CM

## 2015-11-20 DIAGNOSIS — I5043 Acute on chronic combined systolic (congestive) and diastolic (congestive) heart failure: Secondary | ICD-10-CM | POA: Diagnosis not present

## 2015-11-20 DIAGNOSIS — J9601 Acute respiratory failure with hypoxia: Secondary | ICD-10-CM | POA: Diagnosis present

## 2015-11-20 DIAGNOSIS — Z515 Encounter for palliative care: Secondary | ICD-10-CM | POA: Diagnosis not present

## 2015-11-20 DIAGNOSIS — J189 Pneumonia, unspecified organism: Secondary | ICD-10-CM | POA: Diagnosis present

## 2015-11-20 DIAGNOSIS — I5032 Chronic diastolic (congestive) heart failure: Secondary | ICD-10-CM | POA: Insufficient documentation

## 2015-11-20 DIAGNOSIS — R0602 Shortness of breath: Secondary | ICD-10-CM | POA: Diagnosis not present

## 2015-11-20 DIAGNOSIS — Z993 Dependence on wheelchair: Secondary | ICD-10-CM

## 2015-11-20 DIAGNOSIS — E039 Hypothyroidism, unspecified: Secondary | ICD-10-CM | POA: Diagnosis present

## 2015-11-20 DIAGNOSIS — K219 Gastro-esophageal reflux disease without esophagitis: Secondary | ICD-10-CM | POA: Diagnosis present

## 2015-11-20 DIAGNOSIS — R0902 Hypoxemia: Secondary | ICD-10-CM

## 2015-11-20 DIAGNOSIS — I110 Hypertensive heart disease with heart failure: Secondary | ICD-10-CM | POA: Diagnosis not present

## 2015-11-20 DIAGNOSIS — M1990 Unspecified osteoarthritis, unspecified site: Secondary | ICD-10-CM | POA: Diagnosis present

## 2015-11-20 DIAGNOSIS — I2510 Atherosclerotic heart disease of native coronary artery without angina pectoris: Secondary | ICD-10-CM | POA: Diagnosis present

## 2015-11-20 DIAGNOSIS — Z8673 Personal history of transient ischemic attack (TIA), and cerebral infarction without residual deficits: Secondary | ICD-10-CM | POA: Diagnosis not present

## 2015-11-20 DIAGNOSIS — E785 Hyperlipidemia, unspecified: Secondary | ICD-10-CM | POA: Diagnosis present

## 2015-11-20 DIAGNOSIS — I252 Old myocardial infarction: Secondary | ICD-10-CM

## 2015-11-20 DIAGNOSIS — J440 Chronic obstructive pulmonary disease with acute lower respiratory infection: Secondary | ICD-10-CM | POA: Diagnosis not present

## 2015-11-20 DIAGNOSIS — Z7401 Bed confinement status: Secondary | ICD-10-CM

## 2015-11-20 DIAGNOSIS — J449 Chronic obstructive pulmonary disease, unspecified: Secondary | ICD-10-CM | POA: Diagnosis not present

## 2015-11-20 DIAGNOSIS — N184 Chronic kidney disease, stage 4 (severe): Secondary | ICD-10-CM | POA: Diagnosis present

## 2015-11-20 DIAGNOSIS — I5021 Acute systolic (congestive) heart failure: Secondary | ICD-10-CM | POA: Diagnosis not present

## 2015-11-20 DIAGNOSIS — J9811 Atelectasis: Secondary | ICD-10-CM | POA: Diagnosis not present

## 2015-11-20 DIAGNOSIS — I5033 Acute on chronic diastolic (congestive) heart failure: Secondary | ICD-10-CM | POA: Diagnosis not present

## 2015-11-20 LAB — BASIC METABOLIC PANEL
ANION GAP: 5 (ref 5–15)
BUN: 28 mg/dL — ABNORMAL HIGH (ref 6–20)
CO2: 24 mmol/L (ref 22–32)
Calcium: 9 mg/dL (ref 8.9–10.3)
Chloride: 111 mmol/L (ref 101–111)
Creatinine, Ser: 2.35 mg/dL — ABNORMAL HIGH (ref 0.44–1.00)
GFR calc Af Amer: 20 mL/min — ABNORMAL LOW (ref >60)
GFR calc non Af Amer: 17 mL/min — ABNORMAL LOW (ref >60)
GLUCOSE: 167 mg/dL — AB (ref 65–99)
POTASSIUM: 4.6 mmol/L (ref 3.5–5.1)
Sodium: 140 mmol/L (ref 135–145)

## 2015-11-20 LAB — CBC WITH DIFFERENTIAL/PLATELET
BASOS ABS: 0 10*3/uL (ref 0–0.1)
Basophils Relative: 0 %
Eosinophils Absolute: 0.3 10*3/uL (ref 0–0.7)
Eosinophils Relative: 3 %
HEMATOCRIT: 27.8 % — AB (ref 35.0–47.0)
Hemoglobin: 8.9 g/dL — ABNORMAL LOW (ref 12.0–16.0)
LYMPHS ABS: 1.6 10*3/uL (ref 1.0–3.6)
LYMPHS PCT: 15 %
MCH: 30.3 pg (ref 26.0–34.0)
MCHC: 32.1 g/dL (ref 32.0–36.0)
MCV: 94.3 fL (ref 80.0–100.0)
MONO ABS: 0.6 10*3/uL (ref 0.2–0.9)
Monocytes Relative: 5 %
NEUTROS ABS: 8.6 10*3/uL — AB (ref 1.4–6.5)
Neutrophils Relative %: 77 %
Platelets: 357 10*3/uL (ref 150–440)
RBC: 2.95 MIL/uL — AB (ref 3.80–5.20)
RDW: 16.1 % — AB (ref 11.5–14.5)
WBC: 11.1 10*3/uL — ABNORMAL HIGH (ref 3.6–11.0)

## 2015-11-20 LAB — BRAIN NATRIURETIC PEPTIDE: B Natriuretic Peptide: 661 pg/mL — ABNORMAL HIGH (ref 0.0–100.0)

## 2015-11-20 LAB — TROPONIN I: Troponin I: <0.03 ng/mL (ref <0.031)

## 2015-11-20 MED ORDER — ONDANSETRON HCL 4 MG/2ML IJ SOLN: 4.0000 mg | Freq: Four times a day (QID) | INTRAMUSCULAR | Status: DC | PRN

## 2015-11-20 MED ORDER — SODIUM BICARBONATE 650 MG PO TABS
650.0000 mg | ORAL_TABLET | Freq: Two times a day (BID) | ORAL | Status: DC
  Administered 2015-11-20 – 2015-11-22 (×5): 650 mg via ORAL
  Filled 2015-11-20 (×7): qty 1

## 2015-11-20 MED ORDER — NITROGLYCERIN 2 % TD OINT
1.0000 [in_us] | TOPICAL_OINTMENT | Freq: Once | TRANSDERMAL | Status: AC
  Administered 2015-11-20: 1 [in_us] via TOPICAL
  Filled 2015-11-20: qty 1

## 2015-11-20 MED ORDER — FERROUS GLUCONATE 324 (38 FE) MG PO TABS
324.0000 mg | ORAL_TABLET | Freq: Three times a day (TID) | ORAL | Status: DC
  Administered 2015-11-20 – 2015-11-22 (×6): 324 mg via ORAL
  Filled 2015-11-20 (×8): qty 1

## 2015-11-20 MED ORDER — HEPARIN SODIUM (PORCINE) 5000 UNIT/ML IJ SOLN
5000.0000 [IU] | Freq: Three times a day (TID) | INTRAMUSCULAR | Status: DC
  Administered 2015-11-20 – 2015-11-22 (×6): 5000 [IU] via SUBCUTANEOUS
  Filled 2015-11-20 (×6): qty 1

## 2015-11-20 MED ORDER — IPRATROPIUM-ALBUTEROL 0.5-2.5 (3) MG/3ML IN SOLN
3.0000 mL | Freq: Four times a day (QID) | RESPIRATORY_TRACT | Status: DC
  Administered 2015-11-20: 3 mL via RESPIRATORY_TRACT
  Filled 2015-11-20 (×2): qty 3

## 2015-11-20 MED ORDER — VITAMIN B-12 1000 MCG PO TABS
1000.0000 ug | ORAL_TABLET | Freq: Every day | ORAL | Status: DC
  Administered 2015-11-20 – 2015-11-22 (×3): 1000 ug via ORAL
  Filled 2015-11-20 (×4): qty 1

## 2015-11-20 MED ORDER — IPRATROPIUM-ALBUTEROL 0.5-2.5 (3) MG/3ML IN SOLN
RESPIRATORY_TRACT | Status: AC
  Administered 2015-11-20: 3 mL via RESPIRATORY_TRACT
  Filled 2015-11-20: qty 3

## 2015-11-20 MED ORDER — IPRATROPIUM-ALBUTEROL 0.5-2.5 (3) MG/3ML IN SOLN
3.0000 mL | Freq: Once | RESPIRATORY_TRACT | Status: AC
  Administered 2015-11-20: 3 mL via RESPIRATORY_TRACT

## 2015-11-20 MED ORDER — PANTOPRAZOLE SODIUM 40 MG PO TBEC
40.0000 mg | DELAYED_RELEASE_TABLET | Freq: Two times a day (BID) | ORAL | Status: DC
  Administered 2015-11-20 – 2015-11-22 (×5): 40 mg via ORAL
  Filled 2015-11-20 (×5): qty 1

## 2015-11-20 MED ORDER — TIOTROPIUM BROMIDE MONOHYDRATE 18 MCG IN CAPS
18.0000 ug | ORAL_CAPSULE | Freq: Every day | RESPIRATORY_TRACT | Status: DC
  Administered 2015-11-20 – 2015-11-22 (×3): 18 ug via RESPIRATORY_TRACT
  Filled 2015-11-20 (×2): qty 5

## 2015-11-20 MED ORDER — ATORVASTATIN CALCIUM 20 MG PO TABS
40.0000 mg | ORAL_TABLET | Freq: Every day | ORAL | Status: DC
  Administered 2015-11-20 – 2015-11-22 (×3): 40 mg via ORAL
  Filled 2015-11-20 (×3): qty 2

## 2015-11-20 MED ORDER — ALBUTEROL SULFATE (2.5 MG/3ML) 0.083% IN NEBU
2.5000 mg | INHALATION_SOLUTION | RESPIRATORY_TRACT | Status: DC
  Administered 2015-11-20 – 2015-11-21 (×6): 2.5 mg via RESPIRATORY_TRACT
  Filled 2015-11-20 (×8): qty 3

## 2015-11-20 MED ORDER — DOCUSATE SODIUM 100 MG PO CAPS
100.0000 mg | ORAL_CAPSULE | Freq: Two times a day (BID) | ORAL | Status: DC
  Administered 2015-11-20 – 2015-11-22 (×5): 100 mg via ORAL
  Filled 2015-11-20 (×6): qty 1

## 2015-11-20 MED ORDER — CARVEDILOL 6.25 MG PO TABS
6.2500 mg | ORAL_TABLET | Freq: Two times a day (BID) | ORAL | Status: DC
  Administered 2015-11-20 – 2015-11-22 (×5): 6.25 mg via ORAL
  Filled 2015-11-20 (×5): qty 1

## 2015-11-20 MED ORDER — GUAIFENESIN ER 600 MG PO TB12
600.0000 mg | ORAL_TABLET | Freq: Two times a day (BID) | ORAL | Status: DC
  Administered 2015-11-20 – 2015-11-22 (×5): 600 mg via ORAL
  Filled 2015-11-20 (×7): qty 1

## 2015-11-20 MED ORDER — OMEGA-3-ACID ETHYL ESTERS 1 G PO CAPS
1.0000 g | ORAL_CAPSULE | Freq: Every day | ORAL | Status: DC
  Administered 2015-11-20 – 2015-11-22 (×3): 1 g via ORAL
  Filled 2015-11-20 (×4): qty 1

## 2015-11-20 MED ORDER — ISOSORBIDE MONONITRATE ER 60 MG PO TB24
30.0000 mg | ORAL_TABLET | Freq: Every day | ORAL | Status: DC
  Administered 2015-11-20 – 2015-11-22 (×3): 30 mg via ORAL
  Filled 2015-11-20 (×4): qty 1

## 2015-11-20 MED ORDER — BUDESONIDE 0.25 MG/2ML IN SUSP
0.2500 mg | Freq: Two times a day (BID) | RESPIRATORY_TRACT | Status: DC
  Administered 2015-11-20 – 2015-11-22 (×4): 0.25 mg via RESPIRATORY_TRACT
  Filled 2015-11-20 (×4): qty 2

## 2015-11-20 MED ORDER — FUROSEMIDE 10 MG/ML IJ SOLN
20.0000 mg | Freq: Once | INTRAMUSCULAR | Status: AC
  Administered 2015-11-20: 20 mg via INTRAVENOUS
  Filled 2015-11-20: qty 4

## 2015-11-20 MED ORDER — PREDNISONE 20 MG PO TABS
50.0000 mg | ORAL_TABLET | Freq: Every day | ORAL | Status: DC
  Administered 2015-11-21: 50 mg via ORAL
  Filled 2015-11-20: qty 2

## 2015-11-20 MED ORDER — SODIUM CHLORIDE 0.9% FLUSH
3.0000 mL | Freq: Two times a day (BID) | INTRAVENOUS | Status: DC
  Administered 2015-11-20 – 2015-11-22 (×5): 3 mL via INTRAVENOUS

## 2015-11-20 MED ORDER — ATORVASTATIN CALCIUM 20 MG PO TABS
ORAL_TABLET | ORAL | Status: AC
  Administered 2015-11-20: 40 mg via ORAL
  Filled 2015-11-20: qty 1

## 2015-11-20 MED ORDER — HYDRALAZINE HCL 25 MG PO TABS
25.0000 mg | ORAL_TABLET | Freq: Three times a day (TID) | ORAL | Status: DC
  Administered 2015-11-20 – 2015-11-22 (×6): 25 mg via ORAL
  Filled 2015-11-20 (×9): qty 1

## 2015-11-20 MED ORDER — FOLIC ACID 1 MG PO TABS
1.0000 mg | ORAL_TABLET | Freq: Every day | ORAL | Status: DC
  Administered 2015-11-20 – 2015-11-22 (×3): 1 mg via ORAL
  Filled 2015-11-20 (×3): qty 1

## 2015-11-20 MED ORDER — IPRATROPIUM-ALBUTEROL 0.5-2.5 (3) MG/3ML IN SOLN
3.0000 mL | Freq: Once | RESPIRATORY_TRACT | Status: AC
  Administered 2015-11-20: 3 mL via RESPIRATORY_TRACT
  Filled 2015-11-20: qty 3

## 2015-11-20 MED ORDER — ACETAMINOPHEN 650 MG RE SUPP: 650.0000 mg | Freq: Four times a day (QID) | RECTAL | Status: DC | PRN

## 2015-11-20 MED ORDER — LEVOFLOXACIN IN D5W 750 MG/150ML IV SOLN
750.0000 mg | Freq: Once | INTRAVENOUS | Status: AC
  Administered 2015-11-20: 750 mg via INTRAVENOUS
  Filled 2015-11-20: qty 150

## 2015-11-20 MED ORDER — SENNOSIDES-DOCUSATE SODIUM 8.6-50 MG PO TABS: 1.0000 | ORAL_TABLET | Freq: Every evening | ORAL | Status: DC | PRN

## 2015-11-20 MED ORDER — ACETAMINOPHEN 325 MG PO TABS
650.0000 mg | ORAL_TABLET | Freq: Four times a day (QID) | ORAL | Status: DC | PRN
  Filled 2015-11-20: qty 2

## 2015-11-20 MED ORDER — AMLODIPINE BESYLATE 5 MG PO TABS
5.0000 mg | ORAL_TABLET | Freq: Every day | ORAL | Status: DC
  Administered 2015-11-20 – 2015-11-22 (×3): 5 mg via ORAL
  Filled 2015-11-20 (×3): qty 1

## 2015-11-20 MED ORDER — LEVOFLOXACIN IN D5W 250 MG/50ML IV SOLN: 250.0000 mg | INTRAVENOUS | Status: DC

## 2015-11-20 MED ORDER — LEVOTHYROXINE SODIUM 112 MCG PO TABS
112.0000 ug | ORAL_TABLET | Freq: Every day | ORAL | Status: DC
  Administered 2015-11-21 – 2015-11-22 (×2): 112 ug via ORAL
  Filled 2015-11-20 (×4): qty 1

## 2015-11-20 MED ORDER — LEVOFLOXACIN IN D5W 500 MG/100ML IV SOLN
500.0000 mg | INTRAVENOUS | Status: DC
  Filled 2015-11-20: qty 100

## 2015-11-20 MED ORDER — ONDANSETRON HCL 4 MG PO TABS: 4.0000 mg | ORAL_TABLET | Freq: Four times a day (QID) | ORAL | Status: DC | PRN

## 2015-11-20 NOTE — ED Provider Notes (Signed)
[facility] Emergency Department Provider Note     Time seen: ----------------------------------------- 8:39 AM on 11/20/2015 -----------------------------------------  Level V caveat: Review of systems and history is limited by shortness of breath  I have reviewed the triage vital signs and the nursing notes.   HISTORY  Chief Complaint No chief complaint on file.    HPI Melissa Bright is a 82 y.o. female who presents to the ER for respiratory distress. She was last seen normally around 2 PM yesterday, she does live with family and when they checked on her today she was markedly short of breath. First responders arrived to find her oxygen saturations in the 80s. Patient does have a history of CHF,she is brought in by EMS on CPAP.   Past Medical History  Diagnosis Date  . Coronary artery disease   . Arthritis   . Blood transfusion without reported diagnosis   . Hypertension   . Renal disorder   . CHF (congestive heart failure) (HCC)   . Stroke [facility](HCC)     Patient Active Problem List   Diagnosis Date Noted  . Accelerated hypertension 11/09/2015    Past Surgical History  Procedure Laterality Date  . Joint replacement    . Abdominal hysterectomy    . Cardiac surgery    . Eye surgery      Allergies Macrobid  Social History Social History  Substance Use Topics  . Smoking status: Former Games developermoker  . Smokeless tobacco: Not on file  . Alcohol Use: No    Review of Systems Respiratory: Positive for shortness of breath Otherwise review of systems unknown at this time ____________________________________________   PHYSICAL EXAM:  VITAL SIGNS: ED Triage Vitals  Enc Vitals Group     BP --      Pulse --      Resp --      Temp --      Temp src --      SpO2 --      Weight --      Height --      Head Cir --      Peak Flow --      Pain Score --      Pain Loc --      Pain Edu? --      Excl. in GC? --     Constitutional: Alert,  Moderate distress, dyspneic Eyes: Conjunctivae are normal. PERRL. Normal extraocular movements. ENT   Head: Normocephalic and atraumatic.   Nose: No congestion/rhinnorhea.   Mouth/Throat: Mucous membranes are moist.   Neck: No stridor. Cardiovascular: Normal rate, regular rhythm. Normal and symmetric distal pulses are present in all extremities. No murmurs, rubs, or gallops. Respiratory: Dyspnea with diffuse rales and some wheezing, Bilaterally Gastrointestinal: Soft and nontender. No distention. No abdominal bruits.  Musculoskeletal: Nontender with normal range of motion in all extremities. Bilateral lower extremity edema Neurologic:  No gross focal neurologic deficits are appreciated.  Skin:  Skin is warm, dry and intact. No rash noted. Psychiatric: Mood and affect are normal.  ____________________________________________  EKG: Interpreted by me. Normal sinus rhythm with a rate of 89 bpm, normal PR interval, normal QRS, long QT interval. Normal axis. Lateral ST depression.  ____________________________________________  ED COURSE:  Pertinent labs & imaging results that were available during my care of the patient were reviewed by me and considered in my medical decision making (see chart for details). Patient with dyspnea, likely acute CHF exacerbation. We'll check basic labs and x-ray.  ____________________________________________    LABS (pertinent positives/negatives)  Labs Reviewed  CBC WITH DIFFERENTIAL/PLATELET - Abnormal; Notable for the following:    WBC 11.1 (*)    RBC 2.95 (*)    Hemoglobin 8.9 (*)    HCT 27.8 (*)    RDW 16.1 (*)    Neutro Abs 8.6 (*)    All other components within normal limits  BASIC METABOLIC PANEL - Abnormal; Notable for the following:    Glucose, Bld 167 (*)    BUN 28 (*)    Creatinine, Ser 2.35 (*)    GFR calc non Af Amer 17 (*)    GFR calc Af Amer 20 (*)    All other components within normal limits  BRAIN NATRIURETIC PEPTIDE -  Abnormal; Notable for the following:    B Natriuretic Peptide 661.0 (*)    All other components within normal limits  BLOOD GAS, VENOUS - Abnormal; Notable for the following:    pO2, Ven 47.0 (*)    All other components within normal limits  CULTURE, BLOOD (ROUTINE X 2)  CULTURE, BLOOD (ROUTINE X 2)  TROPONIN I   CRITICAL CARE Performed by: Emily Filbert   Total critical care time: 30 minutes  Critical care time was exclusive of separately billable procedures and treating other patients.  Critical care was necessary to treat or prevent imminent or life-threatening deterioration.  Critical care was time spent personally by me on the following activities: development of treatment plan with patient and/or surrogate as well as nursing, discussions with consultants, evaluation of patient's response to treatment, examination of patient, obtaining history from patient or surrogate, ordering and performing treatments and interventions, ordering and review of laboratory studies, ordering and review of radiographic studies, pulse oximetry and re-evaluation of patient's condition.   RADIOLOGY Images were viewed by me  Chest x-ray IMPRESSION: Mild left basilar atelectasis. ____________________________________________  FINAL ASSESSMENT AND PLAN  Dyspnea, hypoxia  Plan: Patient with labs and imaging as dictated above. Patient is improved after DuoNeb here, she will also receive IV steroids. I have given her additional dose of Lasix and will give her IV Levaquin. She would benefit from hospital observation.   Emily Filbert, MD   Emily Filbert, MD 11/20/15 [phone]

## 2015-11-20 NOTE — Progress Notes (Signed)
ANTIBIOTIC CONSULT NOTE - INITIAL  Pharmacy Consult for Levaquin  Indication: pneumonia  Allergies  Allergen Reactions  . Macrobid [employer][Nitrofurantoin Monohyd Macro]     Patient Measurements: Height: 5\' 1"  (154.9 cm) Weight: 167 lb (75.751 kg) IBW/kg (Calculated) : 47.8 Adjusted Body Weight:   Vital Signs: Temp: 98.3 F (36.8 C) (03/22 1504) Temp Source: Oral (03/22 1504) BP: 159/74 mmHg (03/22 1504) Pulse Rate: 81 (03/22 1504) Intake/Output from previous day:   Intake/Output from this shift:    Labs:  Recent Labs  11/20/15 0845  WBC 11.1*  HGB 8.9*  PLT 357  CREATININE 2.35*   Estimated Creatinine Clearance: 15.1 mL/min (by C-G formula based on Cr of 2.35). No results for input(s): VANCOTROUGH, VANCOPEAK, VANCORANDOM, GENTTROUGH, GENTPEAK, GENTRANDOM, TOBRATROUGH, TOBRAPEAK, TOBRARND, AMIKACINPEAK, AMIKACINTROU, AMIKACIN in the last 72 hours.   Microbiology: Recent Results (from the past 720 hour(s))  Culture, blood (Routine X 2) w Reflex to ID Panel     Status: None (Preliminary result)   Collection Time: 11/20/15  8:58 AM  Result Value Ref Range Status   Specimen Description BLOOD LEFT WRIST  Final   Special Requests BOTTLES DRAWN AEROBIC AND ANAEROBIC 2CCAERO,2CCANA  Final   Culture NO GROWTH <12 HOURS  Final   Report Status PENDING  Incomplete  Culture, blood (Routine X 2) w Reflex to ID Panel     Status: None (Preliminary result)   Collection Time: 11/20/15  8:58 AM  Result Value Ref Range Status   Specimen Description BLOOD LEFT ARM  Final   Special Requests BOTTLES DRAWN AEROBIC AND ANAEROBIC 3CCAERO,2CCANA  Final   Culture NO GROWTH <12 HOURS  Final   Report Status PENDING  Incomplete    Medical History: Past Medical History  Diagnosis Date  . Coronary artery disease   . Arthritis   . Blood transfusion without reported diagnosis   . Hypertension   . Renal disorder   . CHF (congestive heart failure) (HCC)   . Stroke [facility](HCC)     Medications:   Scheduled:  . albuterol  2.5 mg Nebulization Q4H  . amLODipine  5 mg Oral Daily  . atorvastatin  40 mg Oral Daily  . budesonide (PULMICORT) nebulizer solution  0.25 mg Nebulization BID  . carvedilol  6.25 mg Oral BID  . docusate sodium  100 mg Oral BID  . ferrous gluconate  324 mg Oral TID WC  . folic acid  1 mg Oral Daily  . guaiFENesin  600 mg Oral BID  . heparin  5,000 Units Subcutaneous 3 times per day  . hydrALAZINE  25 mg Oral 3 times per day  . isosorbide mononitrate  30 mg Oral Daily  . [START ON 11/22/2015] levofloxacin (LEVAQUIN) IV  250 mg Intravenous Q48H  . levothyroxine  112 mcg Oral Daily  . omega-3 acid ethyl esters  1 g Oral Daily  . pantoprazole  40 mg Oral BID  . [START ON 11/21/2015] predniSONE  50 mg Oral Q breakfast  . sodium bicarbonate  650 mg Oral BID  . sodium chloride flush  3 mL Intravenous Q12H  . tiotropium  18 mcg Inhalation Daily  . vitamin B-12  1,000 mcg Oral Daily   Assessment: CrCl = 15.1 ml/min   Goal of Therapy:  resolution of infection   Plan:  Expected duration 7 days with resolution of temperature and/or normalization of WBC   Levaquin 750 mg IV X 1 given in ED on 3/22 @ 11:25. Levaquin 500 mg IV Q24H originally  ordered.  Will adjust dose to Levaquin 250 mg IV Q48H to start on 3/24 @ 10:00.   Jonee Lamore D 11/20/2015,4:13 PM

## 2015-11-20 NOTE — ED Notes (Signed)
Patient placed on nasal cannula.  Patient appears comfortable at this time.

## 2015-11-20 NOTE — ED Notes (Signed)
[facility]  Patient's daughter called EMS due to patient's difficulty breathing.  Patient's oxygen saturation was 80% on EMS arrival on room air.  EMS placed patient on cpap and patient's oxygen went up to 96% on cpap.  Patient is alert and responsive to voice.  Patient's ankles are swollen.  Patient was seen by family yesterday at 2pm and patient was breathing normally at that time.

## 2015-11-20 NOTE — H&P (Signed)
[facility] [place] [facility]   PATIENT NAME: Melissa Bright    MR#:  [id number]  DATE OF BIRTH:  01/28/1924  DATE OF ADMISSION:  11/20/2015  PRIMARY CARE PHYSICIAN: [name], [name], MD   REQUESTING/REFERRING PHYSICIAN:   CHIEF COMPLAINT:   Chief Complaint  Patient presents with  . Shortness of Breath    HISTORY OF PRESENT ILLNESS: Melissa Bright  is a 82 y.o. female with a known history of coronary artery disease, hypertension, CAD stage III to 4, prior stroke  congestive heart failure, former tobacco abuse, who presents to the hospital with complaints of shortness of breath, wheezing, cough. About one to patient's daughter, who is sitting by her side. The patient was admitted to the hospital approximately one half weeks ago for the same shortness of breath at that time she was noted to have blood pressure and I'll attention was directed towards blood pressure management, patient's breathing improved with nebulizing therapy and she was discharged back home. She did well for the past one week. However, today she became acutely short of breath, she was coughing, having difficulty breathing and wheezing. She was brought to emergency room for further evaluation where she was noted to be tachypneic, she required BiPAP administration. Now she is weaned down to 3 L of oxygen through nasal cannula with saturations after a few nebulizers, dose of Lasix. Chest x-ray revealed left lower lobe pneumonia. Initial systolic blood pressure was significantly elevated at 195 requiring nitroglycerin topically. Patient denied any chest pains, but admitted of right shoulder pain, chronic. Hospitalist services were contacted for admission.   PAST MEDICAL HISTORY:   Past Medical History  Diagnosis Date  . Coronary artery disease   . Arthritis   . Blood transfusion without reported diagnosis   . Hypertension   . Renal disorder   . CHF (congestive heart failure) (HCC)   . Stroke  [facility](HCC)     PAST SURGICAL HISTORY: Past Surgical History  Procedure Laterality Date  . Joint replacement    . Abdominal hysterectomy    . Cardiac surgery    . Eye surgery      SOCIAL HISTORY:  Social History  Substance Use Topics  . Smoking status: Former Games developermoker  . Smokeless tobacco: Not on file  . Alcohol Use: No    FAMILY HISTORY:  Family History  Problem Relation Age of Onset  . Heart attack Father     DRUG ALLERGIES:  Allergies  Allergen Reactions  . Macrobid [employer][Nitrofurantoin Monohyd Macro]     Review of Systems  Unable to perform ROS: dementia    MEDICATIONS AT HOME:  Prior to Admission medications   Medication Sig Start Date End Date Taking? Authorizing Provider  amLODipine (NORVASC) 5 MG tablet Take 5 mg by mouth daily.   Yes Historical Provider, MD  atorvastatin (LIPITOR) 40 MG tablet Take 40 mg by mouth daily.   Yes Historical Provider, MD  calcium citrate-vitamin D 500-400 MG-UNIT chewable tablet Chew 1 tablet by mouth 2 (two) times daily.   Yes Historical Provider, MD  carvedilol (COREG) 6.25 MG tablet Take 1 tablet (6.25 mg total) by mouth 2 (two) times daily. 11/10/15  Yes Altamese DillingVaibhavkumar Vachhani, MD  docusate sodium (COLACE) 100 MG capsule Take 100 mg by mouth 2 (two) times daily.   Yes Historical Provider, MD  ferrous gluconate (FERGON) 240 (27 FE) MG tablet Take 240 mg by mouth 3 (three) times daily with meals.   Yes Historical Provider, MD  folic  acid (FOLVITE) 800 MCG tablet Take 800 mcg by mouth daily.   Yes Historical Provider, MD  hydrALAZINE (APRESOLINE) 50 MG tablet Take 25 mg by mouth 2 (two) times daily as needed. Take if SBP is greater than 180 and/or DBP is greater than 110   Yes Historical Provider, MD  isosorbide mononitrate (IMDUR) 30 MG 24 hr tablet Take 30 mg by mouth daily.   Yes Historical Provider, MD  levothyroxine (SYNTHROID, LEVOTHROID) 112 MCG tablet Take 112 mcg by mouth daily.   Yes Historical Provider, MD  Multiple Vitamin  (MULTIVITAMIN WITH MINERALS) TABS tablet Take 1 tablet by mouth daily.   Yes Historical Provider, MD  omega-3 acid ethyl esters (LOVAZA) 1 g capsule Take 1 g by mouth daily.   Yes Historical Provider, MD  pantoprazole (PROTONIX) 40 MG tablet Take 40 mg by mouth 2 (two) times daily.   Yes Historical Provider, MD  senna-docusate (SENOKOT-S) 8.6-50 MG tablet Take 1 tablet by mouth at bedtime as needed for mild constipation.   Yes Historical Provider, MD  sodium bicarbonate 650 MG tablet Take 650 mg by mouth 2 (two) times daily.   Yes Historical Provider, MD  vitamin B-12 (CYANOCOBALAMIN) 1000 MCG tablet Take 1,000 mcg by mouth daily.   Yes Historical Provider, MD      PHYSICAL EXAMINATION:   VITAL SIGNS: Blood pressure 144/54, pulse 66, temperature 97.5 F (36.4 C), temperature source Axillary, resp. rate 14, height  (1.549 m), weight 74.844 kg (165 lb), SpO2 100 %.  GENERAL:  82 y.o.-year-old patient lying in the bed in mild respiratory  Distress, using oxygen per nasal cannulas. Pale, somewhat restless, minimally verbal  EYES: Pupils equal, round, reactive to light and accommodation. No scleral icterus. Extraocular muscles intact.  HEENT: Head atraumatic, normocephalic. Oropharynx and nasopharynx clear.  NECK:  Supple, no jugular venous distention. No thyroid enlargement, no tenderness.  LUNGS:Some Diminishedh sounds bilaterally,few scattered wheezes, , few rales,rhonchi or crepitations at bases, diminished at bases,  some dullness to percussion. Intermittent use  of accessory muscles of respiration  , especially when exerted  CARDIOVASCULAR: S1, S2 normal. No murmurs, rubs, or gallops.  ABDOMEN: Soft, nontender, nondistended. Bowel sounds present. No organomegaly or mass.  EXTREMITIES: No pedal edema, cyanosis, or clubbing.  Thickness and lower extremity as was noted due to chronic lymphedema, no pitting edema  NEUROLOGIC: Cranial nerves II through XII are intact. Muscle strength 5/5 in  all extremities. Sensation intact. Gait not checked.  PSYCHIATRIC: The patient is aleroriented 1, intermittently follows commands. Skin : No obvious rash, lesion, or ulcer.   LABORATORY PANEL:   CBC  Recent Labs Lab 11/20/15 0845  WBC 11.1*  HGB 8.9*  HCT 27.8*  PLT 357  MCV 94.3  MCH 30.3  MCHC 32.1  RDW 16.1*  LYMPHSABS 1.6  MONOABS 0.6  EOSABS 0.3  BASOSABS 0.0   ------------------------------------------------------------------------------------------------------------------  Chemistries   Recent Labs Lab 11/20/15 0845  NA 140  K 4.6  CL 111  CO2 24  GLUCOSE 167*  BUN 28*  CREATININE 2.35*  CALCIUM 9.0   ------------------------------------------------------------------------------------------------------------------  Cardiac Enzymes  Recent Labs Lab 11/20/15 0845  TROPONINI <0.03   ------------------------------------------------------------------------------------------------------------------  RADIOLOGY: Dg Chest Port 1 View  11/20/2015  CLINICAL DATA:  Hypoxia EXAM: PORTABLE CHEST 1 VIEW COMPARISON:  11/09/2015 FINDINGS: Cardiac shadow is within normal limits. The lungs are well aerated bilaterally. Mild atelectatic changes are seen in the left lung base. No focal confluent infiltrate is seen. No bony abnormality is  noted. IMPRESSION: Mild left basilar atelectasis. Electronically Signed   By: Alcide Clever M.D.   On: 11/20/2015 08:54    EKG: Orders placed or performed during the hospital encounter of 11/20/15  . ED EKG  . ED EKG  . EKG 12-Lead  . EKG 12-Lead  EKG reveals atrial paced complexes, rate of 89, normal axis, no discharge changes.   IMPRESSION AND PLAN:  Principal Problem:   Respiratory failure with hypoxia (HCC) Active Problems:   COPD exacerbation (HCC)   Left lower lobe pneumonia   Malignant essential hypertension   Leukocytosis   CKD (chronic kidney disease), stage IV (HCC)   Anemia of chronic disease   Acute respiratory  failure with hypoxia (HCC) #1. Acute respiratory failure with hypoxia, likely COPD exacerbation, admitted patient medical floor. Continue oxygen, taper off as tolerated  #2. COPD exacerbation, initiate patient on steroids orally, budesonide nebulizer, tiotropium, albuterol, follow clinically #3. Left lower lobe pneumonia, initiate patient on levofloxacin intravenously. Get sputum cultures if possible. #4. Leukocytosis, follow-up with antibiotic therapy. #5. CK D stage IV, stable. #6 . Anemia of chronic disease, get Hemoccult   All the records are reviewed and case discussed with ED provider. Management plans discussed with the patient, family and they are in agreement.  CODE STATUS: Code Status History    Date Active Date Inactive Code Status Order ID Comments User Context   11/09/2015  4:26 PM 11/10/2015  5:19 PM DNR [id number]  Houston Siren, MD Inpatient    Questions for Most Recent Historical Code Status (Order [id number])    Question Answer Comment   In the event of cardiac or respiratory ARREST Do not call a "code blue"    In the event of cardiac or respiratory ARREST Do not perform Intubation, CPR, defibrillation or ACLS    In the event of cardiac or respiratory ARREST Use medication by any route, position, wound care, and other measures to relive pain and suffering. May use oxygen, suction and manual treatment of airway obstruction as needed for comfort.        TOTAL TIME TAKING CARE OF THIS PATIENT: 55 minutes.  Discussed this patient's daughter   Katharina Caper M.D on 11/20/2015 at 12:48 PM  Between 7am to 6pm - Pager - [phone] After 6pm go to [email or web address] - password [email or web address] [facility]  [place] [place] [facility]  Office  [phone]  CC: Primary care physician; Marisue Ivan, MD

## 2015-11-21 ENCOUNTER — Inpatient Hospital Stay
Admit: 2015-11-21 | Discharge: 2015-11-21 | Disposition: A | Discharge status: Home / Self Care | Payer: Commercial Managed Care - HMO | Attending: Internal Medicine | Admitting: Internal Medicine

## 2015-11-21 DIAGNOSIS — I252 Old myocardial infarction: Secondary | ICD-10-CM

## 2015-11-21 DIAGNOSIS — J189 Pneumonia, unspecified organism: Secondary | ICD-10-CM

## 2015-11-21 DIAGNOSIS — N184 Chronic kidney disease, stage 4 (severe): Secondary | ICD-10-CM

## 2015-11-21 DIAGNOSIS — J9621 Acute and chronic respiratory failure with hypoxia: Secondary | ICD-10-CM

## 2015-11-21 DIAGNOSIS — Z87891 Personal history of nicotine dependence: Secondary | ICD-10-CM

## 2015-11-21 DIAGNOSIS — I429 Cardiomyopathy, unspecified: Secondary | ICD-10-CM

## 2015-11-21 DIAGNOSIS — Z515 Encounter for palliative care: Secondary | ICD-10-CM

## 2015-11-21 DIAGNOSIS — D649 Anemia, unspecified: Secondary | ICD-10-CM

## 2015-11-21 DIAGNOSIS — J449 Chronic obstructive pulmonary disease, unspecified: Secondary | ICD-10-CM

## 2015-11-21 DIAGNOSIS — I5033 Acute on chronic diastolic (congestive) heart failure: Secondary | ICD-10-CM

## 2015-11-21 DIAGNOSIS — D72829 Elevated white blood cell count, unspecified: Secondary | ICD-10-CM

## 2015-11-21 LAB — CBC
HEMATOCRIT: 23 % — AB (ref 35.0–47.0)
Hemoglobin: 7.4 g/dL — ABNORMAL LOW (ref 12.0–16.0)
MCH: 30.4 pg (ref 26.0–34.0)
MCHC: 32.2 g/dL (ref 32.0–36.0)
MCV: 94.2 fL (ref 80.0–100.0)
Platelets: 253 10*3/uL (ref 150–440)
RBC: 2.44 MIL/uL — AB (ref 3.80–5.20)
RDW: 15.8 % — AB (ref 11.5–14.5)
WBC: 5.4 10*3/uL (ref 3.6–11.0)

## 2015-11-21 LAB — BASIC METABOLIC PANEL
Anion gap: 3 — ABNORMAL LOW (ref 5–15)
BUN: 26 mg/dL — AB (ref 6–20)
CHLORIDE: 111 mmol/L (ref 101–111)
CO2: 26 mmol/L (ref 22–32)
CREATININE: 2.29 mg/dL — AB (ref 0.44–1.00)
Calcium: 8.6 mg/dL — ABNORMAL LOW (ref 8.9–10.3)
GFR calc Af Amer: 21 mL/min — ABNORMAL LOW (ref >60)
GFR calc non Af Amer: 18 mL/min — ABNORMAL LOW (ref >60)
Glucose, Bld: 96 mg/dL (ref 65–99)
POTASSIUM: 4.2 mmol/L (ref 3.5–5.1)
SODIUM: 140 mmol/L (ref 135–145)

## 2015-11-21 MED ORDER — ALBUTEROL SULFATE (2.5 MG/3ML) 0.083% IN NEBU
2.5000 mg | INHALATION_SOLUTION | Freq: Three times a day (TID) | RESPIRATORY_TRACT | Status: DC
  Administered 2015-11-21 – 2015-11-22 (×3): 2.5 mg via RESPIRATORY_TRACT
  Filled 2015-11-21 (×3): qty 3

## 2015-11-21 MED ORDER — LEVOFLOXACIN 250 MG PO TABS
250.0000 mg | ORAL_TABLET | Freq: Every day | ORAL | Status: DC
  Filled 2015-11-21: qty 1

## 2015-11-21 MED ORDER — AZITHROMYCIN 250 MG PO TABS: 250.0000 mg | ORAL_TABLET | Freq: Every day | ORAL | Status: DC

## 2015-11-21 MED ORDER — FUROSEMIDE 10 MG/ML IJ SOLN
20.0000 mg | Freq: Two times a day (BID) | INTRAMUSCULAR | Status: DC
  Administered 2015-11-21 (×2): 20 mg via INTRAVENOUS
  Filled 2015-11-21 (×2): qty 2

## 2015-11-21 MED ORDER — AZITHROMYCIN 250 MG PO TABS
500.0000 mg | ORAL_TABLET | Freq: Every day | ORAL | Status: AC
  Administered 2015-11-21: 500 mg via ORAL
  Filled 2015-11-21: qty 2

## 2015-11-21 NOTE — Clinical Documentation Improvement (Signed)
Hospitalist  Can the diagnosis of CHF be further specified?    Acuity - Acute, Chronic, Acute on Chronic   Type - Systolic, Diastolic, Systolic and Diastolic  Other  Clinically Undetermined   Supporting Information: Noted in past medical history that pt has CHF.  BNP 661   Please exercise your independent, professional judgment when responding. A specific answer is not anticipated or expected.   Thank Modesta MessingYou,  Smiley Birr L [facility] Information Management [place] [phone]

## 2015-11-21 NOTE — Care Management (Addendum)
Admitted to [facility] with the diagnosis of respiratory failure. Son Melissa Bright lives with her. Daughters are Melissa Bright [phone]) and Melissa Bright [phone]). Daughters take care of all of Melissa Bright needs.  Last seen Dr. Burnadette PopLinthavong 1 week ago. Home Health in the past.No home oxygen.  Doesn't remember name of agency. [facility] and [facility] in the past. Followed by [facility] in the past. Lift, rolling walker, wheelchair, bedside commode and shower chair in the home. No falls, Good appetite. Family will transport home per Melissa Bright with a ramp.  Spoke with Dr. Orvan Falconerampbell. Family requesting [facility]. Dayna BarkerKaren Robertson RN representative of [facility] updated. Gwenette GreetBrenda S Brieanne Mignone RN MSN CCM Care Management [phone]

## 2015-11-21 NOTE — Progress Notes (Signed)
New referral for [facility] services at home following discharge [facility] Holland following a Palliative Medicine consult with Dr. Orvan Falconerampbell. Melissa Bright is an 82 year old woman with a with a known history of coronary artery disease, hypertension, CKD stage III to 4, prior stroke congestive heart failure,  who [facility] with complaints of shortness of breath, wheezing and cough. She required Bipap, weaned to 3 liters after nebulizer treatments and IV lasix in the ED. Chest xray showed left lower lobe pneumonia. She also has had increased blood pressure requiring a nito patch. This is the second admission in 2 weeks. Patient seen sitting up in bed eating dinner, now on room air. Daughter [name] at bedside. Patient received hospice services in the past and she and her daughters are familiar with services. Per [name] no new DME needed at this time. Patient has a signed DNR in place in her chart and family plans to take her home via van at discharge. Per CMRN Steward DroneBrenda possible discharge tomorrow. Will continue follow through final disposition. Thank you. Dayna BarkerKaren Robertson RN, BSN, [facility], [facility] Liaison [phone] c

## 2015-11-21 NOTE — Consult Note (Addendum)
Palliative Medicine Inpatient Consult Note   Name: Melissa Bright Date: 11/21/2015 MRN: [id number]  DOB: 07/10/1924  Referring Physician: Fritzi Mandes, MD  Palliative Care consult requested for this 82 y.o. female for goals of medical therapy in patient with acute on chronic respiratory failure.  TODAY: 1. Pt is DNR and has a portable DNR form in the paper chart.  2. I have met with pt and daughter and they would like for Hospice to begin again in the home if I agree.  I do agree that this is appropriate.  There was some discussion of possible [facility].  Family would prefer Hospice in the Home again.  Pt had Hospice for CVA and Ischemic heart disease daignoses.  She had an echo in 2015 showing an EF of 20-25% --but we don't have a more recent echo evaluation of her cardiac function and she had improved after that time enough to be discharged from hospice.  Now, she is having CHF again and it is felt that another echo might help [place] know how her heart is functioning currently.   3.  Pt's presumptive diagnosis is Chronic Systolic CHF.  However, she has other conditions that are concerning. She had a pretty significant presentation of COPD with exacerbation along with her CHF this admission.  She has very severe malignant HTN that is hard to control.  Additionally, she is almost at end-stage renal disease.  Pt's daughter does not want any PT at home as she feels pt needs comfort care again. She is improved here, but has had a decline at home recently and also has had two hospital stays in the last two weeks.  She meets criteria for hospice again --but we do need clarity on her cardiac function so we know what we are dealing with.  I updated Dr Posey Pronto and Care Mgr.  I have also spoken with Hospice Liaison.   4.  I have had a face-to-face exam and discussion with pt and family on this date.    5. Tomorrow, I would like to trim pts med list before she is discharged. She could probably do without a number of  the pills on her list, though management of blood pressure and cardiac meds would continue.  Will talk with family to go over this first, however.    BRIEF HISTORY: Pt is an 83 yo woman who came here with shortness of breath, wheezing, and a cough.  She had been here about 10 days earlier for similar symptoms. Then, she was noted to have elevated blood pressures. She did well once [facility] home for about a week, but on day of admission had great difficulty breathing.  She was tachypneic and with sats in the 80's, she was started on BIPAP.   She was weaned to 3 LPM of oxygen via nasal cannula and she was given several neb treatments and lasix.  CXR showed left lower atelectasis vs pneumonia this admission.  She was started on ABX but she is thought to have a atelectasis and not pneumonia.  Systolic PB was 564 and she was treated with NTG topically.  She had no chest pain but did have right shoulder pain (which is chronic).   Pt had a significant MI (STEMI) in December, 2015.  She was also hospitalized in march of 2015 with an MI.  No cath was done but an echo showed an EF of 20-25%.  She was seen by Palliative Care at that time and recommendation was made for pt  to go home with HOSPICE in the home. She had Hospice in her home until Aug 2016 (for ischemic heart disease).  In Jan 2016, she came in with a GI bleed --but no procedures were ordered.  This was during the time she was actively followed by Hospice in her home.      IMPRESSION Acute on Chronic Resp Flre ---with hypoxia and hypercapnia ---due to atelectasis, CHF, and COPD exacerbation Acute Left Lower Lobe Pneumonia is possible but it is thought to be atelectasis  ACUTE  On CHRONIC SYSTOLIC CHF with elevated BNP ---ECHO FROM 11/04/2013 showed severe cardiomyopathy -------with report as follows: 1. Left ventricular ejection fraction, by visual estimation, is 20 to  25%.  2. Severely decreased global left ventricular systolic function.  3.  Mid and apical inferior septum, apical lateral segment, and apex are  abnormal.  4. Mild left ventricular hypertrophy.  5. Mildly dilated right atrium.  6. Mild to moderate mitral valve regurgitation.  7. Mildly elevated pulmonary artery systolic pressure.  8. Mild to moderate tricuspid regurgitation.   COPD with exacerbation Leukocytosis Anemia of Chronic dz ---Hgb is always around 7 per old notes but on arrival her Hgb was 8.9 Former tobacco abuse CAD  ---w/ h/o STEMI IN 12/05 ---w/ h/o multiple stents  ---seen in past by Dr. Saralyn Pilar, cardiologist Peripheral Vascular Dz H/O TIA's Left Carotid Disease Malignant /Accelerated HTN ---gets PRN Hydralazine at home CKD stage 4 ---Cr was 2.35 at adm with 2.2 her baseline ---Pt gets lasix H/O CHF H/O CVA B12 Deficiency Hypothyroidism GERD Hyperlipidemia    REVIEW OF SYSTEMS:  Patient is not able to provide ROS due to illness  SPIRITUAL SUPPORT SYSTEM: Yes.  SOCIAL HISTORY:  reports that she has quit smoking. She does not have any smokeless tobacco history on file. She reports that she does not drink alcohol or use illicit drugs.  LEGAL DOCUMENTS:  Portable DNR form is located in the paper chart  CODE STATUS: DNR as of this am when Dr Posey Pronto spoke with pts daughter about this   PAST MEDICAL HISTORY: Past Medical History  Diagnosis Date  . Coronary artery disease   . Arthritis   . Blood transfusion without reported diagnosis   . Hypertension   . Renal disorder   . CHF (congestive heart failure) ([place])   . Stroke [facility])     PAST SURGICAL HISTORY:  Past Surgical History  Procedure Laterality Date  . Joint replacement    . Abdominal hysterectomy    . Cardiac surgery    . Eye surgery      ALLERGIES:  is allergic to macrobid.  MEDICATIONS:  Current Facility-Administered Medications  Medication Dose Route Frequency Provider Last Rate Last Dose  . acetaminophen (TYLENOL) tablet 650 mg  650 mg Oral  Q6H PRN Theodoro Grist, MD       Or  . acetaminophen (TYLENOL) suppository 650 mg  650 mg Rectal Q6H PRN Theodoro Grist, MD      . albuterol (PROVENTIL) (2.5 MG/3ML) 0.083% nebulizer solution 2.5 mg  2.5 mg Nebulization Q4H Theodoro Grist, MD   2.5 mg at 11/21/15 0802  . amLODipine (NORVASC) tablet 5 mg  5 mg Oral Daily Theodoro Grist, MD   5 mg at 11/21/15 1033  . atorvastatin (LIPITOR) tablet 40 mg  40 mg Oral Daily Theodoro Grist, MD   40 mg at 11/21/15 1024  . azithromycin (ZITHROMAX) tablet 500 mg  500 mg Oral Daily Fritzi Mandes, MD  Followed by  . [START ON 11/22/2015] azithromycin (ZITHROMAX) tablet 250 mg  250 mg Oral Daily Fritzi Mandes, MD      . budesonide (PULMICORT) nebulizer solution 0.25 mg  0.25 mg Nebulization BID Theodoro Grist, MD   0.25 mg at 11/21/15 0802  . carvedilol (COREG) tablet 6.25 mg  6.25 mg Oral BID Theodoro Grist, MD   6.25 mg at 11/21/15 1036  . docusate sodium (COLACE) capsule 100 mg  100 mg Oral BID Theodoro Grist, MD   100 mg at 11/21/15 1036  . ferrous gluconate (FERGON) tablet 324 mg  324 mg Oral TID WC Theodoro Grist, MD   324 mg at 58/09/98 3382  . folic acid (FOLVITE) tablet 1 mg  1 mg Oral Daily Theodoro Grist, MD   1 mg at 11/21/15 1036  . furosemide (LASIX) injection 20 mg  20 mg Intravenous Q12H Fritzi Mandes, MD   20 mg at 11/21/15 1410  . guaiFENesin (MUCINEX) 12 hr tablet 600 mg  600 mg Oral BID Theodoro Grist, MD   600 mg at 11/21/15 1029  . heparin injection 5,000 Units  5,000 Units Subcutaneous 3 times per day Theodoro Grist, MD   5,000 Units at 11/21/15 1409  . hydrALAZINE (APRESOLINE) tablet 25 mg  25 mg Oral 3 times per day Theodoro Grist, MD   25 mg at 11/21/15 1420  . isosorbide mononitrate (IMDUR) 24 hr tablet 30 mg  30 mg Oral Daily Theodoro Grist, MD   30 mg at 11/21/15 1029  . levothyroxine (SYNTHROID, LEVOTHROID) tablet 112 mcg  112 mcg Oral Daily Theodoro Grist, MD   112 mcg at 11/21/15 0617  . omega-3 acid ethyl esters (LOVAZA) capsule 1 g  1 g Oral  Daily Theodoro Grist, MD   1 g at 11/21/15 1024  . ondansetron (ZOFRAN) tablet 4 mg  4 mg Oral Q6H PRN Theodoro Grist, MD       Or  . ondansetron (ZOFRAN) injection 4 mg  4 mg Intravenous Q6H PRN Theodoro Grist, MD      . pantoprazole (PROTONIX) EC tablet 40 mg  40 mg Oral BID Theodoro Grist, MD   40 mg at 11/21/15 1036  . senna-docusate (Senokot-S) tablet 1 tablet  1 tablet Oral QHS PRN Theodoro Grist, MD      . sodium bicarbonate tablet 650 mg  650 mg Oral BID Theodoro Grist, MD   650 mg at 11/21/15 1036  . sodium chloride flush (NS) 0.9 % injection 3 mL  3 mL Intravenous Q12H Theodoro Grist, MD   3 mL at 11/21/15 1038  . tiotropium (SPIRIVA) inhalation capsule 18 mcg  18 mcg Inhalation Daily Theodoro Grist, MD   18 mcg at 11/21/15 1037  . vitamin B-12 (CYANOCOBALAMIN) tablet 1,000 mcg  1,000 mcg Oral Daily Theodoro Grist, MD   1,000 mcg at 11/21/15 1038    Vital Signs: BP 140/50 mmHg  Pulse 59  Temp(Src) 98.4 F (36.9 C) (Oral)  Resp 19  Ht '5\' 1"'$  (1.549 m)  Wt 75.751 kg (167 lb)  BMI 31.57 kg/m2  SpO2 98% Filed Weights   11/20/15 0843 11/20/15 1504  Weight: 74.844 kg (165 lb) 75.751 kg (167 lb)    Estimated body mass index is 31.57 kg/(m^2) as calculated from the following:   Height as of this encounter: '5\' 1"'$  (1.549 m).   Weight as of this encounter: 75.751 kg (167 lb).  PERFORMANCE STATUS (ECOG) : 4 - Bedbound  PHYSICAL EXAM: Resting with nasal cannula O2 in  place No use of accessory muscles Eyes are open but weakly open Op clear and w/o exudate No JVD or TM but with full neck Hrt rrr with 2/6 HSM Lungs with decreased BS in bases but no rales heard Abd soft and NT Ext with 2plus edema distally --she has thin skin and soft slightly edematous tissues diffusely in extremeties No mottling or cyanosis       LABS: CBC:    Component Value Date/Time   WBC 5.4 11/21/2015 0459   WBC 6.6 12/27/2014 1126   HGB 7.4* 11/21/2015 0459   HGB 11.1* 12/27/2014 1126   HCT 23.0*  11/21/2015 0459   HCT 34.4* 12/27/2014 1126   PLT 253 11/21/2015 0459   PLT 198 12/27/2014 1126   MCV 94.2 11/21/2015 0459   MCV 94 12/27/2014 1126   NEUTROABS 8.6* 11/20/2015 0845   NEUTROABS 4.5 09/02/2014 0604   LYMPHSABS 1.6 11/20/2015 0845   LYMPHSABS 0.9* 09/02/2014 0604   MONOABS 0.6 11/20/2015 0845   MONOABS 0.7 09/02/2014 0604   EOSABS 0.3 11/20/2015 0845   EOSABS 0.3 09/02/2014 0604   BASOSABS 0.0 11/20/2015 0845   BASOSABS 0.0 09/02/2014 0604   Comprehensive Metabolic Panel:    Component Value Date/Time   NA 140 11/21/2015 0459   NA 139 12/27/2014 1126   K 4.2 11/21/2015 0459   K 4.1 12/27/2014 1126   CL 111 11/21/2015 0459   CL 107 12/27/2014 1126   CO2 26 11/21/2015 0459   CO2 27 12/27/2014 1126   BUN 26* 11/21/2015 0459   BUN 45* 12/27/2014 1126   CREATININE 2.29* 11/21/2015 0459   CREATININE 2.22* 12/27/2014 1126   GLUCOSE 96 11/21/2015 0459   GLUCOSE 203* 12/27/2014 1126   CALCIUM 8.6* 11/21/2015 0459   CALCIUM 9.2 12/27/2014 1126   AST 23 11/09/2015 0925   AST 22 12/27/2014 1126   ALT 12* 11/09/2015 0925   ALT 13* 12/27/2014 1126   ALKPHOS 93 11/09/2015 0925   ALKPHOS 86 12/27/2014 1126   BILITOT 0.4 11/09/2015 0925   BILITOT 0.2* 12/27/2014 1126   PROT 7.5 11/09/2015 0925   PROT 6.7 12/27/2014 1126   ALBUMIN 3.1* 11/09/2015 0925   ALBUMIN 3.0* 12/27/2014 1126    More than 50% of the visit was spent in counseling/coordination of care: Yes  Time Spent: 55 minutes

## 2015-11-21 NOTE — Consult Note (Signed)
   [facility] Consult   11/21/2015  Lowella FairyMarie E Hoffman 08/06/1924 [id number]   Patient screened for potential [facility] Care Management services. Patient is eligible for [facility] Services. Touched base with inpatient case manager before entering patient's  room to inquire if patient appropriate and she requested for me to wait related to other services in progress.If patient's post hospital needs change please place a [facility] consult. For questions please contact:   Abdulah Iqbal RN, BSN [facility]  Hospital Liaison  [phone]) Business Mobile [phone]) Toll free office

## 2015-11-21 NOTE — Progress Notes (Signed)
Patient Hgb down to 7.4 this morning from previous day of 8.9.  Dr. Sheryle Hailiamond paged and aware.  No orders for transfusion at this time.  No blood visible in urine or stool this shift.

## 2015-11-21 NOTE — Progress Notes (Signed)
PT Cancellation Note  Patient Details Name: Melissa Bright MRN: [id number] DOB: 01/23/1924   Cancelled Treatment:    Reason Eval/Treat Not Completed: Patient declined, no reason specified.  Pt declining OOB d/t not feeling well.  Pt's daughter present and stated that she does not want PT for pt d/t pt does not tolerate PT well.  Upon further discussion (in regards to assessing transfers in hospital setting), pt's daughter reporting that she is agreeable to PT checking back tomorrow for basic transfer bed to chair assessment if pt is up to it but does not want any PT upon discharge from hospital.  Palliative care consult pending.  Will monitor POC and re-attempt PT eval tomorrow if appropriate.    Melissa Bright 11/21/2015, 11:53 AM Melissa Bright, PT [phone]

## 2015-11-21 NOTE — Progress Notes (Signed)
MEDICATION RELATED CONSULT NOTE - INITIAL   Pharmacy Consult for Potential ACEI Therapy Indication: Systolic HF  Allergies  Allergen Reactions  . Macrobid [employer][Nitrofurantoin Monohyd Macro]     Patient Measurements: Height: 5\' 1"  (154.9 cm) Weight: 167 lb (75.751 kg) IBW/kg (Calculated) : 47.8  Vital Signs: Temp: 98.4 F (36.9 C) (03/23 1331) Temp Source: Oral (03/23 1331) BP: 137/45 mmHg (03/23 1331) Pulse Rate: 59 (03/23 1331) Intake/Output from previous day:   Intake/Output from this shift: Total I/O In: 120 [P.O.:120] Out: -   Labs:  Recent Labs  11/20/15 0845 11/21/15 0459  WBC 11.1* 5.4  HGB 8.9* 7.4*  HCT 27.8* 23.0*  PLT 357 253  CREATININE 2.35* 2.29*   Estimated Creatinine Clearance: 15.5 mL/min (by C-G formula based on Cr of 2.29).   Microbiology: Recent Results (from the past 720 hour(s))  Culture, blood (Routine X 2) w Reflex to ID Panel     Status: None (Preliminary result)   Collection Time: 11/20/15  8:58 AM  Result Value Ref Range Status   Specimen Description BLOOD LEFT WRIST  Final   Special Requests BOTTLES DRAWN AEROBIC AND ANAEROBIC 2CCAERO,2CCANA  Final   Culture NO GROWTH 1 DAY  Final   Report Status PENDING  Incomplete  Culture, blood (Routine X 2) w Reflex to ID Panel     Status: None (Preliminary result)   Collection Time: 11/20/15  8:58 AM  Result Value Ref Range Status   Specimen Description BLOOD LEFT ARM  Final   Special Requests BOTTLES DRAWN AEROBIC AND ANAEROBIC 3CCAERO,2CCANA  Final   Culture NO GROWTH 1 DAY  Final   Report Status PENDING  Incomplete    Medical History: Past Medical History  Diagnosis Date  . Coronary artery disease   . Arthritis   . Blood transfusion without reported diagnosis   . Hypertension   . Renal disorder   . CHF (congestive heart failure) (HCC)   . Stroke [facility](HCC)     Patient with Systolic HF.  Per MD, no ACEI therapy at this time as patient's SCr is elevated.  Continue to follow for  appropriateness of therapy addition.  Mattingly Fountaine G 11/21/2015,2:17 PM

## 2015-11-21 NOTE — Progress Notes (Addendum)
Patient ID: Lowella FairyMarie E Que, female   DOB: 11/15/1923, 82 y.o.   MRN: [id number] [facility] [place] [facility]   PATIENT NAME: Wells GuilesMarie Remigio    MR#:  [id number]  DATE OF BIRTH:  09/18/1923  SUBJECTIVE:  Came in with sob and congestion  REVIEW OF SYSTEMS:   Review of Systems  Constitutional: Negative for fever, chills and weight loss.  HENT: Negative for ear discharge, ear pain and nosebleeds.   Eyes: Negative for blurred vision, pain and discharge.  Respiratory: Positive for cough and shortness of breath. Negative for sputum production, wheezing and stridor.   Cardiovascular: Negative for chest pain, palpitations, orthopnea and PND.  Gastrointestinal: Negative for nausea, vomiting, abdominal pain and diarrhea.  Genitourinary: Negative for urgency and frequency.  Musculoskeletal: Negative for back pain and joint pain.  Neurological: Negative for sensory change, speech change, focal weakness and weakness.  Psychiatric/Behavioral: Negative for depression and hallucinations. The patient is not nervous/anxious.   All other systems reviewed and are negative.  Tolerating Diet:yes Tolerating PT: bed bound  DRUG ALLERGIES:   Allergies  Allergen Reactions  . Macrobid [Nitrofurantoin Monohyd Macro]     VITALS:  Blood pressure 148/61, pulse 70, temperature 99 F (37.2 C), temperature source Oral, resp. rate 18, height 5\' 1"  (1.549 m), weight 75.751 kg (167 lb), SpO2 99 %.  PHYSICAL EXAMINATION:   Physical Exam  GENERAL:  82 y.o.-year-old patient lying in the bed with no acute distress.  EYES: Pupils equal, round, reactive to light and accommodation. No scleral icterus. Extraocular muscles intact.  HEENT: Head atraumatic, normocephalic. Oropharynx and nasopharynx clear.  NECK:  Supple, no jugular venous distention. No thyroid enlargement, no tenderness.  LUNGS: Normal breath sounds bilaterally, no wheezing, rales, rhonchi. No use of accessory muscles of  respiration.  CARDIOVASCULAR: S1, S2 normal. No murmurs, rubs, or gallops.  ABDOMEN: Soft, nontender, nondistended. Bowel sounds present. No organomegaly or mass.  EXTREMITIES: No cyanosis, clubbing or edema b/l.    NEUROLOGIC: Cranial nerves II through XII are intact. No focal Motor or sensory deficits b/l.   PSYCHIATRIC:  patient is alert and oriented x 3.  SKIN: No obvious rash, lesion, or ulcer.   LABORATORY PANEL:  CBC  Recent Labs Lab 11/21/15 0459  WBC 5.4  HGB 7.4*  HCT 23.0*  PLT 253    Chemistries   Recent Labs Lab 11/21/15 0459  NA 140  K 4.2  CL 111  CO2 26  GLUCOSE 96  BUN 26*  CREATININE 2.29*  CALCIUM 8.6*   Cardiac Enzymes  Recent Labs Lab 11/20/15 0845  TROPONINI <0.03   RADIOLOGY:  Dg Chest Port 1 View  11/20/2015  CLINICAL DATA:  Hypoxia EXAM: PORTABLE CHEST 1 VIEW COMPARISON:  11/09/2015 FINDINGS: Cardiac shadow is within normal limits. The lungs are well aerated bilaterally. Mild atelectatic changes are seen in the left lung base. No focal confluent infiltrate is seen. No bony abnormality is noted. IMPRESSION: Mild left basilar atelectasis. Electronically Signed   By: Alcide CleverMark  Lukens M.D.   On: 11/20/2015 08:54   ASSESSMENT AND PLAN:  Wells GuilesMarie Mayeda is a 82 y.o. female with a known history of coronary artery disease, hypertension, CAD stage III to 4, prior stroke congestive heart failure, former tobacco abuse, who presents to the hospital with complaints of shortness of breath, wheezing, cough.  1. acute on chronic Respiratory failure with hypoxia (HCC) due to acute on chronic systolic congestive heart failure exacerbation. Patient came in about a week  ago had exerted hypertension. She does not take diuretics at home discharge home after blood pressure was controlled, continue to have shortness of breath to the point she started having some wheezing. -Her BNP is elevated. Chest x-ray shows some pulmonary vascular congestion. -We'll give her Lasix  20 mg IV twice a day for today - monitor I's and O's monitor her oxygen monitor her creatinine closely given history of chronic kidney disease stage IV  2. COPD exacerbation (HCC) mild more so in the setting of CHF No need for by mouth steroids at present. She received a dose yesterday in the ED Chest x-ray shows left lower lobe atelectasis. Patient does not have side symptoms of pneumonia. She probably has some upper respiratory congestion. She was started on empiric Levaquin which I'll continue  3. Malignant essential hypertension Continue home meds. Her blood pressure is much better today  4. Mild Leukocytosis  5. CKD (chronic kidney disease), stage IV (HCC) Baseline creatinine around 2.2  Came in with creatinine of 2.35. Patient is getting Lasix will monitor her creatinine.  6. Anemia of chronic disease Hemoglobin is anywhere from 7.4-8.9. We'll start on iron pills. Patient does not have any active bleeding cord off on blood transfusion only give as needed.  7. DVT prophylaxis subcutaneous heparin.   8. Address CODE STATUS with daughter who is a primary caregiver patient is a DO NOT RESUSCITATE  9. Discharge planning patient will go home with daughter. She is bedbound and wheelchair-bound for last 15 years. Case discussed with Care Management/Social Worker. Management plans discussed with the patient, family and they are in agreement.  CODE STATUS: DO NOT RESUSCITATE  DVT Prophylaxis: Heparin  TOTAL TIME TAKING CARE OF THIS PATIENT: 35 minutes.  >50% time spent on counselling and coordination of care patient's daughter and patient's sister  POSSIBLE D/C IN 2-3 days DAYS, DEPENDING ON CLINICAL CONDITION.  Note: This dictation was prepared with Dragon dictation along with smaller phrase technology. Any transcriptional errors that result from this process are unintentional.  Jahsiah Carpenter M.D on 11/21/2015 at 11:54 AM  Between 7am to 6pm - Pager - [phone]  After 6pm  go to [email or web address] - password [email or web address] [facility]  [place] [facility]  Office  [phone]  CC: Primary care physician; Marisue Ivan, MD

## 2015-11-21 NOTE — Progress Notes (Signed)
*  PRELIMINARY RESULTS* Echocardiogram 2D Echocardiogram has been performed.  Melissa Bright 11/21/2015, 9:16 PM

## 2015-11-22 DIAGNOSIS — I5032 Chronic diastolic (congestive) heart failure: Secondary | ICD-10-CM | POA: Insufficient documentation

## 2015-11-22 LAB — BASIC METABOLIC PANEL
ANION GAP: 5 (ref 5–15)
BUN: 29 mg/dL — ABNORMAL HIGH (ref 6–20)
CHLORIDE: 108 mmol/L (ref 101–111)
CO2: 26 mmol/L (ref 22–32)
CREATININE: 2.52 mg/dL — AB (ref 0.44–1.00)
Calcium: 8.9 mg/dL (ref 8.9–10.3)
GFR calc non Af Amer: 16 mL/min — ABNORMAL LOW (ref >60)
GFR, EST AFRICAN AMERICAN: 18 mL/min — AB (ref >60)
Glucose, Bld: 115 mg/dL — ABNORMAL HIGH (ref 65–99)
Potassium: 4.2 mmol/L (ref 3.5–5.1)
SODIUM: 139 mmol/L (ref 135–145)

## 2015-11-22 LAB — BLOOD GAS, VENOUS
Acid-Base Excess: 0.4 mmol/L (ref 0.0–3.0)
Bicarbonate: 26.8 mEq/L (ref 21.0–28.0)
DELIVERY SYSTEMS: POSITIVE
O2 Saturation: 78.7 %
PCO2 VEN: 52 mmHg (ref 44.0–60.0)
PH VEN: 7.32 (ref 7.320–7.430)
Patient temperature: 37
pO2, Ven: 47 mmHg — ABNORMAL HIGH (ref 31.0–45.0)

## 2015-11-22 LAB — ECHOCARDIOGRAM COMPLETE
Height: 61 in
WEIGHTICAEL: 2672 [oz_av]

## 2015-11-22 MED ORDER — FUROSEMIDE 20 MG PO TABS: 20.0000 mg | ORAL_TABLET | Freq: Every day | ORAL | Status: DC | PRN

## 2015-11-22 MED ORDER — FERROUS GLUCONATE 240 (27 FE) MG PO TABS
240.0000 mg | ORAL_TABLET | Freq: Every day | ORAL | Status: DC
Start: 2015-11-22 — End: 2017-05-23

## 2015-11-22 MED ORDER — IPRATROPIUM-ALBUTEROL 0.5-2.5 (3) MG/3ML IN SOLN: 3.0000 mL | RESPIRATORY_TRACT | Status: DC | PRN

## 2015-11-22 MED ORDER — FUROSEMIDE 20 MG PO TABS
20.0000 mg | ORAL_TABLET | ORAL | Status: DC
  Administered 2015-11-22: 20 mg via ORAL
  Filled 2015-11-22: qty 1

## 2015-11-22 NOTE — Progress Notes (Signed)
Follow up visit on new referral for [facility] at home following discharge. Patient seen sitting up in bed, alert and interactive, daughter Eber JonesCarolyn at bedside. Plan remains for patient to discharge home today via family Zenaida Niecevan. Nebulizer machine ordered through hospice triage for delivery today. No other needs identified. Discharge summary faxed to referral. Hospital care team and family all aware of and in agreement with plan. Thank you. Dayna BarkerKaren Robertson RN, BSN, [facility], [facility] L[facility] [phone] c

## 2015-11-22 NOTE — Progress Notes (Signed)
Palliative Medicine Inpatient Consult Follow Up Note   Name: Melissa Bright Date: 11/22/2015 MRN: [id number]  DOB: 12/02/1923  Referring Physician: Enedina FinnerSona Patel, MD  Palliative Care consult requested for this 82 y.o. female for goals of medical therapy in patient with an admission for COPD exacerbation and CHF.  UPDATE: Pt came in wheezing and with CHF.  She has a h/o prior STEMI and she also had a severe cardiomyopathy with EF 20-25% in 2015.  She was under care of Hospice from Mar 2015 until Aug 2016 following an MI.  She has recently come in twice in two weeks to this hospital and family asked if pt could be restarted with Hospice in the home.  They do not want therapy saying, "it will kill her". They say she 'just can't do it'.  Pt is and has been bed and chair bound for many months.  Family is with her 24 hrs / day and they move her from bed to chair etc and care for her skin meticulously.  Pt is also reported to be one who eats well.  Family checks her BP and they give her prn Hydralazine when it gets out of control --which occasionally happens.  Hospice was consulted regarding restarting Hospice in the Home.  I felt it important to clarify the 'Hospice Diagnosis' and we waited on the results of the echo to see if she still has a problem with systolic cardiac function --or not.  Today, her echo comes back showing and EF of 55-60%.  But, it also showed Diastolic Dysfunction.  She is at high risk of chronic and recurrent acute daistolic CHF --especially given her problem with recurrent accelerated hypertensive episodes.  Additionally, her creatinine seems to be inc reasing.  This may be due to some lasix which had to be given here.  Her baseline is usually around 2.2 and her CKD is stage 4.  Now, her creatinine is 2.52.   The Cr Clearance is now only 18 (still stage 4 CKD --once Cr Clearance is less than 15 it becomes end stage or stage 5).    I talked with daughter (and attempted to talk with pt)  about trying to reduce her daily 'pill burden'.  It seems that she would be happy to go without some pills but she does OK with them and according to family is not showing any adverse effects from them.  She is not even having constipation by taking the Colace to counteract the effects of iron.  Most people do not respond to just colace and it is not recommended any longer for elderly sedentary patients. BUT she does Ok with it --though family occasionally has to give her something like Citracal or Metamucil over the counter for constipation.  I will talk with the Hospice Liaison about her appropriateness for hospice in the home --which might be focused on her near end stage kidney disease AND her chronic DIASTOLIC heart failure worsened by her Accelerated HTN.  She is at high risk for worsening end stage renal failure (especially if she has to have lasix for more Diastolic CHF spells --or to prevent them).  I suspect she will have more episodes of severe dyspnea and I still feel that Hospice is appropriate.   I am not sure we need to reduce her meds as yet, though I would make lasix a PRN pill only.  Family will want meds reviewed with pts primary MD and as time goes on, she should have a reduction  in meds to focus more and more on comfort.     IMPRESSION Acute on Chronic Resp Flre ---with hypoxia and hypercapnia ---due to atelectasis, CHF, and COPD exacerbation Acute Left Lower Lobe Pneumonia is possible but it is thought to be atelectasis ACUTE On CHRONIC  DIASTOLIC CHF with elevated BNP ----SHE USED TO HAVE SYSTOLIC CHF ----NOW SHE HAS DIASTOLIC CHF THAT IS CHRONIC WITH ACUTE EXACERBATION ----SEE BOTH ECHO REPORTS BELOW. COPD with exacerbation Leukocytosis Anemia of Chronic dz ---Hgb is always around 7 per old notes but on arrival her Hgb was 8.9 Former tobacco abuse CAD  ---w/ h/o STEMI IN 12/05 ---w/ h/o multiple stents  ---seen in past by Dr. Darrold Junker, cardiologist Peripheral  Vascular Dz H/O TIA's Left Carotid Disease Malignant /Accelerated HTN ---gets PRN Hydralazine at home CKD stage 4 ---Cr was 2.35 at adm with 2.2 her baseline ---Pt gets lasix H/O CHF H/O CVA B12 Deficiency Hypothyroidism GERD Hyperlipidemia   CODE STATUS: DNR   PAST MEDICAL HISTORY: Past Medical History  Diagnosis Date  . Coronary artery disease   . Arthritis   . Blood transfusion without reported diagnosis   . Hypertension   . Renal disorder   . CHF (congestive heart failure) (HCC)   . Stroke [facility])     PAST SURGICAL HISTORY:  Past Surgical History  Procedure Laterality Date  . Joint replacement    . Abdominal hysterectomy    . Cardiac surgery    . Eye surgery      Vital Signs: BP 132/82 mmHg  Pulse 73  Temp(Src) 98.1 F (36.7 C) (Oral)  Resp 18  Ht  (1.549 m)  Wt 75.751 kg (167 lb)  BMI 31.57 kg/m2  SpO2 96% Filed Weights   11/20/15 0843 11/20/15 1504  Weight: 74.844 kg (165 lb) 75.751 kg (167 lb)    Estimated body mass index is 31.57 kg/(m^2) as calculated from the following:   Height as of this encounter:  (1.549 m).   Weight as of this encounter: 75.751 kg (167 lb).  PHYSICAL EXAM: Nad Lying in medical bed with head of bed up somewhat ----she is nonverbal during my visit though she did look at me and seemed like she was trying to listen and understand as I spoke with daughter in the room. EOMI OP clear Neck no JVD or TM Hrt rrr no mgr Lungs clear no rales Abd soft and NT Skin with doughy mild diffuse edema    LABS: CBC:    Component Value Date/Time   WBC 5.4 11/21/2015 0459   WBC 6.6 12/27/2014 1126   HGB 7.4* 11/21/2015 0459   HGB 11.1* 12/27/2014 1126   HCT 23.0* 11/21/2015 0459   HCT 34.4* 12/27/2014 1126   PLT 253 11/21/2015 0459   PLT 198 12/27/2014 1126   MCV 94.2 11/21/2015 0459   MCV 94 12/27/2014 1126   NEUTROABS 8.6* 11/20/2015 0845   NEUTROABS 4.5 09/02/2014 0604   LYMPHSABS 1.6 11/20/2015 0845    LYMPHSABS 0.9* 09/02/2014 0604   MONOABS 0.6 11/20/2015 0845   MONOABS 0.7 09/02/2014 0604   EOSABS 0.3 11/20/2015 0845   EOSABS 0.3 09/02/2014 0604   BASOSABS 0.0 11/20/2015 0845   BASOSABS 0.0 09/02/2014 0604   Comprehensive Metabolic Panel:    Component Value Date/Time   NA 139 11/22/2015 0526   NA 139 12/27/2014 1126   K 4.2 11/22/2015 0526   K 4.1 12/27/2014 1126   CL 108 11/22/2015 0526   CL 107 12/27/2014 1126  CO2 26 11/22/2015 0526   CO2 27 12/27/2014 1126   BUN 29* 11/22/2015 0526   BUN 45* 12/27/2014 1126   CREATININE 2.52* 11/22/2015 0526   CREATININE 2.22* 12/27/2014 1126   GLUCOSE 115* 11/22/2015 0526   GLUCOSE 203* 12/27/2014 1126   CALCIUM 8.9 11/22/2015 0526   CALCIUM 9.2 12/27/2014 1126   AST 23 11/09/2015 0925   AST 22 12/27/2014 1126   ALT 12* 11/09/2015 0925   ALT 13* 12/27/2014 1126   ALKPHOS 93 11/09/2015 0925   ALKPHOS 86 12/27/2014 1126   BILITOT 0.4 11/09/2015 0925   BILITOT 0.2* 12/27/2014 1126   PROT 7.5 11/09/2015 0925   PROT 6.7 12/27/2014 1126   ALBUMIN 3.1* 11/09/2015 0925   ALBUMIN 3.0* 12/27/2014 1126     ---ECHO FROM 11/04/2013 showed severe (SYSTOLIC) cardiomyopathy -------with report as follows: 1. Left ventricular ejection fraction, by visual estimation, is 20 to  25%.  2. Severely decreased global left ventricular systolic function.  3. Mid and apical inferior septum, apical lateral segment, and apex are  abnormal.  4. Mild left ventricular hypertrophy.  5. Mildly dilated right atrium.  6. Mild to moderate mitral valve regurgitation.  7. Mildly elevated pulmonary artery systolic pressure.  8. Mild to moderate tricuspid regurgitation.   ECHO 11/21/2015 SHOWS FINDINGS EXPLAINING PTS DIASTOLIC CHF: - Procedure narrative: Transthoracic echocardiography. Image  quality was poor. The study was technically difficult, as a  result of poor acoustic windows, poor sound wave transmission,  and body habitus. -  Left ventricle: Systolic function was normal. The estimated  ejection fraction was in the range of 55% to 65%. Doppler  parameters are consistent with abnormal left ventricular  relaxation (grade 1 diastolic dysfunction).    More than 50% of the visit was spent in counseling/coordination of care: YES  Time Spent:   35 MIN

## 2015-11-22 NOTE — Care Management (Signed)
Patient discharging [facility] and [facility]. Nebulizer requested per MD and hospice will arrange. No further RNCM needs. Case closed.

## 2015-11-22 NOTE — Progress Notes (Signed)
Patient discharged home with hospice. All discharge instructions given and all questions answered. Escorted by daughter and nursing.

## 2015-11-22 NOTE — Discharge Instructions (Signed)
Hospice to follow at home 

## 2015-11-22 NOTE — Care Management Important Message (Signed)
Important Message  Patient Details  Name: Melissa Bright MRN: [id number] Date of Birth: 06/18/1924   Medicare Important Message Given:  Yes    Olegario MessierKathy A Marializ Ferrebee 11/22/2015, 11:23 AM

## 2015-11-22 NOTE — Discharge Summary (Signed)
[facility] - [place] at [facility]   PATIENT NAME: Melissa Bright    MR#:  [id number]  DATE OF BIRTH:  17-Jan-1924  DATE OF ADMISSION:  11/20/2015 ADMITTING PHYSICIAN: Katharina Caper, MD  DATE OF DISCHARGE: 11/22/15  PRIMARY CARE PHYSICIAN: Marisue Ivan, MD    ADMISSION DIAGNOSIS:  Dyspnea [R06.00] Hypoxia [R09.02] Acute on chronic congestive heart failure, unspecified congestive heart failure type (HCC) [I50.9]  DISCHARGE DIAGNOSIS:  Acute on chronic congestive heart failure diastolic/systolic Coronary artery disease Hypertension Bedbound  SECONDARY DIAGNOSIS:   Past Medical History  Diagnosis Date  . Coronary artery disease   . Arthritis   . Blood transfusion without reported diagnosis   . Hypertension   . Renal disorder   . CHF (congestive heart failure) (HCC)   . Stroke [facility])     HOSPITAL COURSE:   Melissa Bright is a 83 y.o. female with a known history of coronary artery disease, hypertension, CAD stage III to 4, prior stroke congestive heart failure, former tobacco abuse, who presents to the hospital with complaints of shortness of breath, wheezing, cough.  1. acute on chronic Respiratory failure with hypoxia (HCC) due to acute on chronic systolic congestive heart failure exacerbation. Patient came in about a week ago had exerted hypertension. She does not take diuretics at home discharge home after blood pressure was controlled, continue to have shortness of breath to the point she started having some wheezing. -Her BNP is elevated. Chest x-ray shows some pulmonary vascular congestion. -recieved Lasix 20 mg IV twice a day and now on prn lasix - monitor I's and O's monitor her oxygen monitor her creatinine closely given history of chronic kidney disease stage IV  2. COPD exacerbation (HCC) mild more so in the setting of CHF No need for by mouth steroids at present. She received a dose yesterday in the ED Chest x-ray shows left lower  lobe atelectasis. Patient does not have side symptoms of pneumonia. She probably has some upper respiratory congestion.  3. Malignant essential hypertension Continue home meds. Her blood pressure is much better today  4. Mild Leukocytosis  5. CKD (chronic kidney disease), stage IV (HCC) Baseline creatinine around 2.2  Came in with creatinine of 2.35.  6. Anemia of chronic disease Hemoglobin is anywhere from 7.4-8.9. We'll start on iron pills. Patient does not have any active bleeding cord off on blood transfusion only give as needed.  7. DVT prophylaxis subcutaneous heparin.   Patient was evaluated by palliative care and patient is hospice appropriate. Hospice at home will be arranged by Manager. Patient will be discharged to home with hospice today  CODE STATUS no code DO NOT RESUSCITATE. Daughter is agreeable. CONSULTS OBTAINED:     DRUG ALLERGIES:   Allergies  Allergen Reactions  . Macrobid [Nitrofurantoin Monohyd Macro]     DISCHARGE MEDICATIONS:   Current Discharge Medication List    START taking these medications   Details  furosemide (LASIX) 20 MG tablet Take 1 tablet (20 mg total) by mouth daily as needed (Take one daily as needed for increased shortness of breath.  Notify Hospice  if needing to take two days in a row). Qty: 30 tablet, Refills: 0    ipratropium-albuterol (DUONEB) 0.5-2.5 (3) MG/3ML SOLN Take 3 mLs by nebulization every 4 (four) hours as needed. Qty: 360 mL, Refills: 0      CONTINUE these medications which have CHANGED   Details  ferrous gluconate (FERGON) 240 (27 FE) MG tablet Take 1 tablet (240  mg total) by mouth daily.      CONTINUE these medications which have NOT CHANGED   Details  amLODipine (NORVASC) 5 MG tablet Take 5 mg by mouth daily.    atorvastatin (LIPITOR) 40 MG tablet Take 40 mg by mouth daily.    carvedilol (COREG) 6.25 MG tablet Take 1 tablet (6.25 mg total) by mouth 2 (two) times daily. Qty: 60 tablet, Refills: 0     docusate sodium (COLACE) 100 MG capsule Take 100 mg by mouth 2 (two) times daily.    folic acid (FOLVITE) 800 MCG tablet Take 800 mcg by mouth daily.    hydrALAZINE (APRESOLINE) 50 MG tablet Take 25 mg by mouth 2 (two) times daily as needed. Take if SBP is greater than 180 and/or DBP is greater than 110    isosorbide mononitrate (IMDUR) 30 MG 24 hr tablet Take 30 mg by mouth daily.    levothyroxine (SYNTHROID, LEVOTHROID) 112 MCG tablet Take 112 mcg by mouth daily.    omega-3 acid ethyl esters (LOVAZA) 1 g capsule Take 1 g by mouth daily.    pantoprazole (PROTONIX) 40 MG tablet Take 40 mg by mouth 2 (two) times daily.    senna-docusate (SENOKOT-S) 8.6-50 MG tablet Take 1 tablet by mouth at bedtime as needed for mild constipation.    sodium bicarbonate 650 MG tablet Take 650 mg by mouth 2 (two) times daily.    vitamin B-12 (CYANOCOBALAMIN) 1000 MCG tablet Take 1,000 mcg by mouth daily.      STOP taking these medications     calcium citrate-vitamin D 500-400 MG-UNIT chewable tablet      Multiple Vitamin (MULTIVITAMIN WITH MINERALS) TABS tablet         If you experience worsening of your admission symptoms, develop shortness of breath, life threatening emergency, suicidal or homicidal thoughts you must seek medical attention immediately by calling 911 or calling your MD immediately  if symptoms less severe.  You Must read complete instructions/literature along with all the possible adverse reactions/side effects for all the Medicines you take and that have been prescribed to you. Take any new Medicines after you have completely understood and accept all the possible adverse reactions/side effects.   Please note  You were cared for by a hospitalist during your hospital stay. If you have any questions about your discharge medications or the care you received while you were in the hospital after you are discharged, you can call the unit and asked to speak with the hospitalist on call  if the hospitalist that took care of you is not available. Once you are discharged, your primary care physician will handle any further medical issues. Please note that NO REFILLS for any discharge medications will be authorized once you are discharged, as it is imperative that you return to your primary care physician (or establish a relationship with a primary care physician if you do not have one) for your aftercare needs so that they can reassess your need for medications and monitor your lab values. Today   SUBJECTIVE   Feels better  VITAL SIGNS:  Blood pressure 132/82, pulse 73, temperature 98.1 F (36.7 C), temperature source Oral, resp. rate 18, height 5\' 1"  (1.549 m), weight 75.751 kg (167 lb), SpO2 96 %.  I/O:   Intake/Output Summary (Last 24 hours) at 11/22/15 1111 Last data filed at 11/22/15 0800  Gross per 24 hour  Intake    360 ml  Output      0 ml  Net  360 ml    PHYSICAL EXAMINATION:  GENERAL:  82 y.o.-year-old patient lying in the bed with no acute distress.  EYES: Pupils equal, round, reactive to light and accommodation. No scleral icterus. Extraocular muscles intact.  HEENT: Head atraumatic, normocephalic. Oropharynx and nasopharynx clear.  NECK:  Supple, no jugular venous distention. No thyroid enlargement, no tenderness.  LUNGS: Normal breath sounds bilaterally, no wheezing, rales,rhonchi or crepitation. No use of accessory muscles of respiration.  CARDIOVASCULAR: S1, S2 normal. No murmurs, rubs, or gallops.  ABDOMEN: Soft, non-tender, non-distended. Bowel sounds present. No organomegaly or mass.  EXTREMITIES: No pedal edema, cyanosis, or clubbing.  NEUROLOGIC: Cranial nerves II through XII are intact. Muscle strength 5/5 in all extremities. Sensation intact. Gait not checked.  PSYCHIATRIC: The patient is alert and oriented x 3.  SKIN: No obvious rash, lesion, or ulcer.   DATA REVIEW:   CBC   Recent Labs Lab 11/21/15 0459  WBC 5.4  HGB 7.4*  HCT 23.0*   PLT 253    Chemistries   Recent Labs Lab 11/22/15 0526  NA 139  K 4.2  CL 108  CO2 26  GLUCOSE 115*  BUN 29*  CREATININE 2.52*  CALCIUM 8.9    Microbiology Results   Recent Results (from the past 240 hour(s))  Culture, blood (Routine X 2) w Reflex to ID Panel     Status: None (Preliminary result)   Collection Time: 11/20/15  8:58 AM  Result Value Ref Range Status   Specimen Description BLOOD LEFT WRIST  Final   Special Requests BOTTLES DRAWN AEROBIC AND ANAEROBIC 2CCAERO,2CCANA  Final   Culture NO GROWTH 1 DAY  Final   Report Status PENDING  Incomplete  Culture, blood (Routine X 2) w Reflex to ID Panel     Status: None (Preliminary result)   Collection Time: 11/20/15  8:58 AM  Result Value Ref Range Status   Specimen Description BLOOD LEFT ARM  Final   Special Requests BOTTLES DRAWN AEROBIC AND ANAEROBIC 3CCAERO,2CCANA  Final   Culture NO GROWTH 1 DAY  Final   Report Status PENDING  Incomplete    RADIOLOGY:  No results found.   Management plans discussed with the patient, family and they are in agreement.  CODE STATUS:     Code Status Orders        Start     Ordered   11/21/15 1130  Do not attempt resuscitation (DNR)   Continuous    Question Answer Comment  In the event of cardiac or respiratory ARREST Do not call a "code blue"   In the event of cardiac or respiratory ARREST Do not perform Intubation, CPR, defibrillation or ACLS   In the event of cardiac or respiratory ARREST Use medication by any route, position, wound care, and other measures to relive pain and suffering. May use oxygen, suction and manual treatment of airway obstruction as needed for comfort.      11/21/15 1130    Code Status History    Date Active Date Inactive Code Status Order ID Comments User Context   11/20/2015  1:12 PM 11/21/2015 11:30 AM Full Code [id number]  Katharina Caper, MD ED   11/09/2015  4:26 PM 11/10/2015  5:19 PM DNR [id number]  Houston Siren, MD Inpatient    Advance  Directive Documentation        Most Recent Value   Type of Advance Directive  Living will, Healthcare Power of Attorney   Pre-existing out of facility DNR order (yellow form  or pink MOST form)     "MOST" Form in Place?        TOTAL TIME TAKING CARE OF THIS PATIENT: 40 minutes.    Semisi Biela M.D on 11/22/2015 at 11:11 AM  Between 7am to 6pm - Pager - [phone] After 6pm go to [email or web address] - password [email or web address] [facility]  [place] [facility]  Office  [phone]  CC: Primary care physician; Marisue Ivan, MD

## 2015-11-25 LAB — CULTURE, BLOOD (ROUTINE X 2)
Culture: NO GROWTH
Culture: NO GROWTH

## 2015-12-25 DIAGNOSIS — R55 Syncope and collapse: Secondary | ICD-10-CM | POA: Diagnosis not present

## 2016-03-24 ENCOUNTER — Observation Stay
Admission: EM | Admit: 2016-03-24 | Discharge: 2016-03-26 | Disposition: A | Discharge status: Home / Self Care | Payer: Medicare Other | Attending: Internal Medicine | Admitting: Internal Medicine

## 2016-03-24 DIAGNOSIS — D520 Dietary folate deficiency anemia: Secondary | ICD-10-CM | POA: Diagnosis not present

## 2016-03-24 DIAGNOSIS — R8281 Pyuria: Secondary | ICD-10-CM | POA: Insufficient documentation

## 2016-03-24 DIAGNOSIS — M1990 Unspecified osteoarthritis, unspecified site: Secondary | ICD-10-CM | POA: Diagnosis not present

## 2016-03-24 DIAGNOSIS — I10 Essential (primary) hypertension: Secondary | ICD-10-CM

## 2016-03-24 DIAGNOSIS — N189 Chronic kidney disease, unspecified: Secondary | ICD-10-CM

## 2016-03-24 DIAGNOSIS — N179 Acute kidney failure, unspecified: Secondary | ICD-10-CM | POA: Diagnosis not present

## 2016-03-24 DIAGNOSIS — E872 Acidosis: Secondary | ICD-10-CM | POA: Diagnosis not present

## 2016-03-24 DIAGNOSIS — D62 Acute posthemorrhagic anemia: Secondary | ICD-10-CM | POA: Diagnosis not present

## 2016-03-24 DIAGNOSIS — D631 Anemia in chronic kidney disease: Secondary | ICD-10-CM | POA: Diagnosis not present

## 2016-03-24 DIAGNOSIS — Z8673 Personal history of transient ischemic attack (TIA), and cerebral infarction without residual deficits: Secondary | ICD-10-CM | POA: Insufficient documentation

## 2016-03-24 DIAGNOSIS — I130 Hypertensive heart and chronic kidney disease with heart failure and stage 1 through stage 4 chronic kidney disease, or unspecified chronic kidney disease: Secondary | ICD-10-CM | POA: Insufficient documentation

## 2016-03-24 DIAGNOSIS — I2510 Atherosclerotic heart disease of native coronary artery without angina pectoris: Secondary | ICD-10-CM | POA: Diagnosis not present

## 2016-03-24 DIAGNOSIS — N184 Chronic kidney disease, stage 4 (severe): Secondary | ICD-10-CM | POA: Insufficient documentation

## 2016-03-24 DIAGNOSIS — K922 Gastrointestinal hemorrhage, unspecified: Principal | ICD-10-CM

## 2016-03-24 DIAGNOSIS — D649 Anemia, unspecified: Secondary | ICD-10-CM | POA: Diagnosis not present

## 2016-03-24 DIAGNOSIS — Z79899 Other long term (current) drug therapy: Secondary | ICD-10-CM | POA: Diagnosis not present

## 2016-03-24 DIAGNOSIS — I509 Heart failure, unspecified: Secondary | ICD-10-CM | POA: Diagnosis not present

## 2016-03-24 DIAGNOSIS — Z87891 Personal history of nicotine dependence: Secondary | ICD-10-CM | POA: Insufficient documentation

## 2016-03-24 DIAGNOSIS — Z66 Do not resuscitate: Secondary | ICD-10-CM | POA: Diagnosis not present

## 2016-03-24 DIAGNOSIS — K8020 Calculus of gallbladder without cholecystitis without obstruction: Secondary | ICD-10-CM | POA: Insufficient documentation

## 2016-03-24 DIAGNOSIS — F0391 Unspecified dementia with behavioral disturbance: Secondary | ICD-10-CM | POA: Insufficient documentation

## 2016-03-24 DIAGNOSIS — Z881 Allergy status to other antibiotic agents status: Secondary | ICD-10-CM | POA: Diagnosis not present

## 2016-03-24 DIAGNOSIS — R7989 Other specified abnormal findings of blood chemistry: Secondary | ICD-10-CM | POA: Diagnosis not present

## 2016-03-24 DIAGNOSIS — I5032 Chronic diastolic (congestive) heart failure: Secondary | ICD-10-CM | POA: Diagnosis not present

## 2016-03-24 DIAGNOSIS — E039 Hypothyroidism, unspecified: Secondary | ICD-10-CM | POA: Diagnosis not present

## 2016-03-24 DIAGNOSIS — K625 Hemorrhage of anus and rectum: Secondary | ICD-10-CM | POA: Diagnosis not present

## 2016-03-24 DIAGNOSIS — F03918 Unspecified dementia, unspecified severity, with other behavioral disturbance: Secondary | ICD-10-CM

## 2016-03-24 DIAGNOSIS — Z7401 Bed confinement status: Secondary | ICD-10-CM | POA: Diagnosis not present

## 2016-03-24 DIAGNOSIS — R932 Abnormal findings on diagnostic imaging of liver and biliary tract: Secondary | ICD-10-CM

## 2016-03-24 LAB — URINALYSIS COMPLETE WITH MICROSCOPIC (ARMC ONLY)
BILIRUBIN URINE: NEGATIVE
GLUCOSE, UA: NEGATIVE mg/dL
HGB URINE DIPSTICK: NEGATIVE
KETONES UR: NEGATIVE mg/dL
NITRITE: NEGATIVE
PH: 6 (ref 5.0–8.0)
Protein, ur: 100 mg/dL — AB
Specific Gravity, Urine: 1.012 (ref 1.005–1.030)

## 2016-03-24 LAB — PREPARE RBC (CROSSMATCH)

## 2016-03-24 LAB — ABO/RH: ABO/RH(D): B NEG

## 2016-03-24 MED ORDER — FAMOTIDINE IN NACL 20-0.9 MG/50ML-% IV SOLN
20.0000 mg | Freq: Two times a day (BID) | INTRAVENOUS | Status: DC
  Administered 2016-03-24 – 2016-03-25 (×2): 20 mg via INTRAVENOUS
  Filled 2016-03-24 (×4): qty 50

## 2016-03-24 MED ORDER — SENNOSIDES-DOCUSATE SODIUM 8.6-50 MG PO TABS: 1.0000 | ORAL_TABLET | Freq: Every evening | ORAL | Status: DC | PRN

## 2016-03-24 MED ORDER — SODIUM CHLORIDE 0.9 % IV SOLN: Freq: Once | INTRAVENOUS | Status: DC

## 2016-03-24 MED ORDER — AMLODIPINE BESYLATE 5 MG PO TABS
5.0000 mg | ORAL_TABLET | Freq: Every day | ORAL | Status: DC
  Administered 2016-03-25 – 2016-03-26 (×2): 5 mg via ORAL
  Filled 2016-03-24 (×2): qty 1

## 2016-03-24 MED ORDER — ONDANSETRON HCL 4 MG/2ML IJ SOLN: 4.0000 mg | Freq: Four times a day (QID) | INTRAMUSCULAR | Status: DC | PRN

## 2016-03-24 MED ORDER — DOCUSATE SODIUM 100 MG PO CAPS
100.0000 mg | ORAL_CAPSULE | Freq: Two times a day (BID) | ORAL | Status: DC
  Administered 2016-03-24 – 2016-03-26 (×4): 100 mg via ORAL
  Filled 2016-03-24 (×4): qty 1

## 2016-03-24 MED ORDER — ACETAMINOPHEN 650 MG RE SUPP: 650.0000 mg | Freq: Four times a day (QID) | RECTAL | Status: DC | PRN

## 2016-03-24 MED ORDER — LEVOTHYROXINE SODIUM 112 MCG PO TABS
112.0000 ug | ORAL_TABLET | Freq: Every day | ORAL | Status: DC
  Administered 2016-03-25 – 2016-03-26 (×2): 112 ug via ORAL
  Filled 2016-03-24 (×2): qty 1

## 2016-03-24 MED ORDER — ONDANSETRON HCL 4 MG PO TABS: 4.0000 mg | ORAL_TABLET | Freq: Four times a day (QID) | ORAL | Status: DC | PRN

## 2016-03-24 MED ORDER — ATORVASTATIN CALCIUM 20 MG PO TABS
40.0000 mg | ORAL_TABLET | Freq: Every day | ORAL | Status: DC
  Administered 2016-03-24 – 2016-03-26 (×3): 40 mg via ORAL
  Filled 2016-03-24 (×3): qty 2

## 2016-03-24 MED ORDER — IPRATROPIUM-ALBUTEROL 0.5-2.5 (3) MG/3ML IN SOLN: 3.0000 mL | RESPIRATORY_TRACT | Status: DC | PRN

## 2016-03-24 MED ORDER — SODIUM BICARBONATE 650 MG PO TABS
650.0000 mg | ORAL_TABLET | Freq: Two times a day (BID) | ORAL | Status: DC
  Administered 2016-03-24 – 2016-03-26 (×4): 650 mg via ORAL
  Filled 2016-03-24 (×4): qty 1

## 2016-03-24 MED ORDER — LATANOPROST 0.005 % OP SOLN
1.0000 [drp] | Freq: Every day | OPHTHALMIC | Status: DC
  Administered 2016-03-24 – 2016-03-25 (×2): 1 [drp] via OPHTHALMIC
  Filled 2016-03-24 (×2): qty 2.5

## 2016-03-24 MED ORDER — OMEGA-3-ACID ETHYL ESTERS 1 G PO CAPS
1.0000 g | ORAL_CAPSULE | Freq: Every day | ORAL | Status: DC
  Administered 2016-03-25 – 2016-03-26 (×2): 1 g via ORAL
  Filled 2016-03-24 (×2): qty 1

## 2016-03-24 MED ORDER — VITAMIN B-12 1000 MCG PO TABS
1000.0000 ug | ORAL_TABLET | Freq: Every day | ORAL | Status: DC
  Administered 2016-03-25 – 2016-03-26 (×2): 1000 ug via ORAL
  Filled 2016-03-24 (×2): qty 1

## 2016-03-24 MED ORDER — CARVEDILOL 6.25 MG PO TABS
6.2500 mg | ORAL_TABLET | Freq: Two times a day (BID) | ORAL | Status: DC
  Administered 2016-03-24 – 2016-03-25 (×3): 6.25 mg via ORAL
  Filled 2016-03-24 (×3): qty 1

## 2016-03-24 MED ORDER — SODIUM CHLORIDE 0.9 % IV SOLN
INTRAVENOUS | Status: DC
  Administered 2016-03-24: 21:00:00 via INTRAVENOUS

## 2016-03-24 MED ORDER — FERROUS GLUCONATE 240 (27 FE) MG PO TABS
240.0000 mg | ORAL_TABLET | Freq: Every day | ORAL | Status: DC
  Administered 2016-03-25: 240 mg via ORAL
  Filled 2016-03-24 (×2): qty 1

## 2016-03-24 MED ORDER — ISOSORBIDE MONONITRATE ER 30 MG PO TB24
30.0000 mg | ORAL_TABLET | Freq: Every day | ORAL | Status: DC
  Administered 2016-03-25 – 2016-03-26 (×2): 30 mg via ORAL
  Filled 2016-03-24 (×2): qty 1

## 2016-03-24 MED ORDER — FUROSEMIDE 40 MG PO TABS
20.0000 mg | ORAL_TABLET | Freq: Every day | ORAL | Status: DC | PRN
Start: 2016-03-24 — End: 2016-03-26

## 2016-03-24 MED ORDER — PANTOPRAZOLE SODIUM 40 MG PO TBEC
40.0000 mg | DELAYED_RELEASE_TABLET | Freq: Two times a day (BID) | ORAL | Status: DC
  Administered 2016-03-24 – 2016-03-26 (×4): 40 mg via ORAL
  Filled 2016-03-24 (×4): qty 1

## 2016-03-24 MED ORDER — HYDRALAZINE HCL 25 MG PO TABS
25.0000 mg | ORAL_TABLET | Freq: Two times a day (BID) | ORAL | Status: DC | PRN
  Administered 2016-03-24: 25 mg via ORAL
  Filled 2016-03-24: qty 1

## 2016-03-24 MED ORDER — ACETAMINOPHEN 325 MG PO TABS
650.0000 mg | ORAL_TABLET | Freq: Four times a day (QID) | ORAL | Status: DC | PRN
  Administered 2016-03-26: 650 mg via ORAL
  Filled 2016-03-24: qty 2

## 2016-03-24 MED ORDER — FOLIC ACID 1 MG PO TABS
1000.0000 ug | ORAL_TABLET | Freq: Every day | ORAL | Status: DC
  Administered 2016-03-25 – 2016-03-26 (×2): 1 mg via ORAL
  Filled 2016-03-24 (×2): qty 1

## 2016-03-24 NOTE — ED Notes (Signed)
Attempted to call report

## 2016-03-24 NOTE — ED Triage Notes (Addendum)
Pt bib EMS w/ c/o abnormal lab.  Pt received call from PCP's office reporting a hemoglobin 7.0.  Pt alert, denies pain, LOC, dizziness, SOB or CP.

## 2016-03-24 NOTE — H&P (Signed)
[facility] - [place] at [facility]   PATIENT NAME: Melissa Bright    MR#:  [id number]  DATE OF BIRTH:  Nov 23, 1923  DATE OF ADMISSION:  03/24/2016  PRIMARY CARE PHYSICIAN: Marisue Ivan, MD   REQUESTING/REFERRING PHYSICIAN: Dr. Gladstone Pih  CHIEF COMPLAINT:   Chief Complaint  Patient presents with  . Abnormal Lab  Symptomatic Anemia  HISTORY OF PRESENT ILLNESS:  Melissa Bright  is a 82 y.o. female with a known history of Osteoarthritis, CHF, coronary artery disease, hypertension, chronic kidney disease stage III, history of previous CVA who presents to the hospital due to 2 episodes of maroon stools yesterday and feeling weak and noted to have a low hemoglobin. As per the patient's family gives most of the history patient had 2 episodes of maroon stools yesterday without abdominal pain, fever, nausea, vomiting. They called the patient's primary care physician who ordered some blood work and she was noted to be mildly anemic and therefore he referred her to the ER for further evaluation. Patient is complaining of some generalized weakness but she is bedbound at baseline. Given her symptomatic anemia and GI bleed hospitalist services were contacted further treatment and evaluation.  PAST MEDICAL HISTORY:   Past Medical History:  Diagnosis Date  . Arthritis   . Blood transfusion without reported diagnosis   . CHF (congestive heart failure) (HCC)   . Coronary artery disease   . Hypertension   . Renal disorder   . Stroke [facility])     PAST SURGICAL HISTORY:   Past Surgical History:  Procedure Laterality Date  . ABDOMINAL HYSTERECTOMY    . CARDIAC SURGERY    . EYE SURGERY    . JOINT REPLACEMENT      SOCIAL HISTORY:   Social History  Substance Use Topics  . Smoking status: Former Games developer  . Smokeless tobacco: Not on file  . Alcohol use No    FAMILY HISTORY:   Family History  Problem Relation Age of Onset  . Heart attack Father     DRUG ALLERGIES:    Allergies  Allergen Reactions  . Macrobid [employer] Macro]     REVIEW OF SYSTEMS:   Review of Systems  Constitutional: Negative for fever and weight loss.  HENT: Negative for congestion, nosebleeds and tinnitus.   Eyes: Negative for blurred vision, double vision and redness.  Respiratory: Negative for cough, hemoptysis and shortness of breath.   Cardiovascular: Negative for chest pain, orthopnea, leg swelling and PND.  Gastrointestinal: Positive for blood in stool. Negative for abdominal pain, diarrhea, melena, nausea and vomiting.  Genitourinary: Negative for dysuria, hematuria and urgency.  Musculoskeletal: Negative for falls and joint pain.  Neurological: Negative for dizziness, tingling, sensory change, focal weakness, seizures, weakness and headaches.  Endo/Heme/Allergies: Negative for polydipsia. Does not bruise/bleed easily.  Psychiatric/Behavioral: Negative for depression and memory loss. The patient is not nervous/anxious.     MEDICATIONS AT HOME:   Prior to Admission medications   Medication Sig Start Date End Date Taking? Authorizing Provider  amLODipine (NORVASC) 5 MG tablet Take 5 mg by mouth daily.   Yes Historical Provider, MD  atorvastatin (LIPITOR) 40 MG tablet Take 40 mg by mouth daily.   Yes Historical Provider, MD  carvedilol (COREG) 6.25 MG tablet Take 1 tablet (6.25 mg total) by mouth 2 (two) times daily. 11/10/15  Yes Altamese Dilling, MD  docusate sodium (COLACE) 100 MG capsule Take 100 mg by mouth 2 (two) times daily.   Yes Historical Provider, MD  ferrous gluconate (FERGON) 240 (27 FE) MG tablet Take 1 tablet (240 mg total) by mouth daily. 11/22/15  Yes Suan Halter, MD  folic acid (FOLVITE) 800 MCG tablet Take 800 mcg by mouth daily.   Yes Historical Provider, MD  furosemide (LASIX) 20 MG tablet Take 1 tablet (20 mg total) by mouth daily as needed (Take one daily as needed for increased shortness of breath.  Notify Hospice  if  needing to take two days in a row). 11/22/15  Yes Suan Halter, MD  hydrALAZINE (APRESOLINE) 50 MG tablet Take 25 mg by mouth 2 (two) times daily as needed. Take if SBP is greater than 180 and/or DBP is greater than 110   Yes Historical Provider, MD  ipratropium-albuterol (DUONEB) 0.5-2.5 (3) MG/3ML SOLN Take 3 mLs by nebulization every 4 (four) hours as needed. 11/22/15  Yes Suan Halter, MD  isosorbide mononitrate (IMDUR) 30 MG 24 hr tablet Take 30 mg by mouth daily.   Yes Historical Provider, MD  latanoprost (XALATAN) 0.005 % ophthalmic solution Place 1 drop into both eyes at bedtime.   Yes Historical Provider, MD  levothyroxine (SYNTHROID, LEVOTHROID) 112 MCG tablet Take 112 mcg by mouth daily.   Yes Historical Provider, MD  omega-3 acid ethyl esters (LOVAZA) 1 g capsule Take 1 g by mouth daily.   Yes Historical Provider, MD  pantoprazole (PROTONIX) 40 MG tablet Take 40 mg by mouth 2 (two) times daily.   Yes Historical Provider, MD  senna-docusate (SENOKOT-S) 8.6-50 MG tablet Take 1 tablet by mouth at bedtime as needed for mild constipation.   Yes Historical Provider, MD  sodium bicarbonate 650 MG tablet Take 650 mg by mouth 2 (two) times daily.   Yes Historical Provider, MD  vitamin B-12 (CYANOCOBALAMIN) 1000 MCG tablet Take 1,000 mcg by mouth daily.   Yes Historical Provider, MD      VITAL SIGNS:  Blood pressure (!) 190/85, pulse 63, temperature 98 F (36.7 C), temperature source Oral, resp. rate 18, weight 69.9 kg (154 lb 3.2 oz), SpO2 97 %.  PHYSICAL EXAMINATION:  Physical Exam  GENERAL:  82 y.o.-year-old patient lying in the bed in no acute distress.  EYES: Pupils equal, round, reactive to light and accommodation. No scleral icterus. Extraocular muscles intact.  HEENT: Head atraumatic, normocephalic. Oropharynx and nasopharynx clear. No oropharyngeal erythema, moist oral mucosa  NECK:  Supple, no jugular venous distention. No thyroid enlargement, no tenderness.  LUNGS:  Normal breath sounds bilaterally, no wheezing, rales, rhonchi. No use of accessory muscles of respiration.  CARDIOVASCULAR: S1, S2 RRR. No murmurs, rubs, gallops, clicks.  ABDOMEN: Soft, nontender, nondistended. Bowel sounds present. No organomegaly or mass.  EXTREMITIES: +2 edema b/l, No cyanosis, or clubbing. + 2 pedal & radial pulses b/l.   NEUROLOGIC: Cranial nerves II through XII are intact. No focal Motor or sensory deficits appreciated b/l. Globally weak and bedbound. PSYCHIATRIC: The patient is alert and oriented x 3.   SKIN: No obvious rash, lesion, or ulcer.   LABORATORY PANEL:   CBC No results for input(s): WBC, HGB, HCT, PLT in the last 168 hours. ------------------------------------------------------------------------------------------------------------------  Chemistries  No results for input(s): NA, K, CL, CO2, GLUCOSE, BUN, CREATININE, CALCIUM, MG, AST, ALT, ALKPHOS, BILITOT in the last 168 hours.  Invalid input(s): GFRCGP ------------------------------------------------------------------------------------------------------------------  Cardiac Enzymes No results for input(s): TROPONINI in the last 168 hours. ------------------------------------------------------------------------------------------------------------------  RADIOLOGY:  No results found.   IMPRESSION AND PLAN:   82 year old female with past medical history of previous  CVA, chronic anemia, chronic kidney disease stage III, essential hypertension, coronary disease, history of CHF, osteoarthritis who presents to the hospital due to [facility] stools and weakness and noted to be anemic.  1. Symptomatic anemia-this is the cause of patient's weakness. -This secondary to GI bleed as mentioned. Patient had 2 episodes of maroon stools yesterday. I will transfuse her 1 unit of packed red blood cells and follow hemoglobin. -No plans for aggressive GI intervention given advanced age.  2. GI bleed-a lower GI bleed  given her known stools. I suspect this is diverticular in nature and self-limiting. -Family does not want any aggressive intervention and therefore hold off on gastroenterology evaluation. -Follow hemoglobin, transfuse as needed.  3. Acute on chronic renal failure-patient's baseline creatinine is around 1.7-2. -Patient's creatinine now elevated to 2.8. Likely prerenal in nature related to the volume loss from the GI bleed. -Gently hydrate with IV fluids, give blood transfusion. Follow BUN and creatinine.   4. Essential hypertension-continue Norvasc, Coreg, hydralazine, Imdur.  5. Hyperlipidemia-continue atorvastatin.  6. History of CHF-patient is clinically not in congestive heart failure. Continue Coreg, Lasix, hydralazine, Imdur.  7. Hypothyroidism-continue Synthroid.  8. History of glaucoma-continue latanoprost eyedrops.  9. GERD-continue Protonix.    All the records are reviewed and case discussed with ED provider. Management plans discussed with the patient, family and they are in agreement.  CODE STATUS: DNR  TOTAL TIME TAKING CARE OF THIS PATIENT: 45 minutes.    [place] Siren M.D on 03/24/2016 at 6:46 PM  Between 7am to 6pm - Pager - [phone]  After 6pm go to [email or web address] - password [email or web address] [facility]  [place] [facility]  Office  [phone]  CC: Primary care physician; Marisue Ivan, MD

## 2016-03-24 NOTE — ED Notes (Signed)
Pts daughter sts that pt had "a bleeding spell yesterday through her bowels".  Pts daughter sts it was dark red

## 2016-03-24 NOTE — ED Provider Notes (Signed)
[facility] Emergency Department Provider Note   ____________________________________________  Time seen: Approximately 540PM  I have reviewed the triage vital signs and the nursing notes.   HISTORY  Chief Complaint Abnormal Lab  HPI Melissa Bright is a 82 y.o. female with a history of CHF as well as coronary artery disease who is presenting to the emergency department today with 3 episodes of burgundy blood per rectum yesterday. The patient has not had any bloody stools today however her family says that she has grown progressively weak. They said that she had also complained of epigastric abdominal pain yesterday but is not complaining of any pain today. She was evaluated at the [facility] earlier today and found to have a hemoglobin in the sevens. She was then sent to the emergency department for further evaluation and likely admission.  Patient also takes iron for anemia and last took it this morning. She is not on any blood thinners. Has a history of diverticular bleeding. Past Medical History:  Diagnosis Date  . Arthritis   . Blood transfusion without reported diagnosis   . CHF (congestive heart failure) (HCC)   . Coronary artery disease   . Hypertension   . Renal disorder   . Stroke [facility])     Patient Active Problem List   Diagnosis Date Noted  . Acute on chronic diastolic congestive heart failure (HCC)   . Respiratory failure with hypoxia (HCC) 11/20/2015  . COPD exacerbation (HCC) 11/20/2015  . Left lower lobe pneumonia 11/20/2015  . Leukocytosis 11/20/2015  . Malignant essential hypertension 11/20/2015  . CKD (chronic kidney disease), stage IV (HCC) 11/20/2015  . Anemia of chronic disease 11/20/2015  . Acute respiratory failure with hypoxia (HCC) 11/20/2015  . Accelerated hypertension 11/09/2015    Past Surgical History:  Procedure Laterality Date  . ABDOMINAL HYSTERECTOMY    . CARDIAC SURGERY    . EYE SURGERY    . JOINT  REPLACEMENT      Current Outpatient Rx  . Order #: [id number] Class: Historical Med  . Order #: [id number] Class: Historical Med  . Order #: [id number] Class: Print  . Order #: [id number] Class: Historical Med  . Order #: [id number] Class: No Print  . Order #: [id number] Class: Historical Med  . Order #: [id number] Class: Normal  . Order #: [id number] Class: Historical Med  . Order #: [id number] Class: Print  . Order #: [id number] Class: Historical Med  . Order #: [id number] Class: Historical Med  . Order #: [id number] Class: Historical Med  . Order #: [id number] Class: Historical Med  . Order #: [id number] Class: Historical Med  . Order #: [id number] Class: Historical Med  . Order #: [id number] Class: Historical Med    Allergies Macrobid [nitrofurantoin monohyd macro]  Family History  Problem Relation Age of Onset  . Heart attack Father     Social History Social History  Substance Use Topics  . Smoking status: Former Games developer  . Smokeless tobacco: Not on file  . Alcohol use No    Review of Systems Constitutional: No fever/chills Eyes: No visual changes. ENT: No sore throat. Cardiovascular: Denies chest pain. Respiratory: Denies shortness of breath. Gastrointestinal:  No nausea, no vomiting.  No diarrhea.  No constipation. Genitourinary: Negative for dysuria. Musculoskeletal: Negative for back pain. Skin: Negative for rash. Neurological: Negative for headaches, focal weakness or numbness.  10-point ROS otherwise negative.  ____________________________________________   PHYSICAL EXAM:  VITAL SIGNS: ED Triage Vitals  Enc Vitals Group     BP 03/24/16 1701 (!) 190/85     Pulse Rate  03/24/16 1701 66     Resp 03/24/16 1701 18     Temp 03/24/16 1701 98 F (36.7 C)     Temp Source 03/24/16 1701 Oral     SpO2 03/24/16 1701 98 %     Weight 03/24/16 1701 154 lb 3.2 oz (69.9 kg)     Height --      Head Circumference --      Peak Flow --      Pain Score 03/24/16 1703 0     Pain Loc --       Pain Edu? --      Excl. in GC? --     Constitutional: Alert and oriented. Well appearing and in no acute distress. Eyes: Conjunctivae are normal. PERRL. EOMI. Head: Atraumatic. Nose: No congestion/rhinnorhea. Mouth/Throat: Mucous membranes are moist.  Neck: No stridor.   Cardiovascular: Normal rate, regular rhythm. Grossly normal heart sounds.   Respiratory: Normal respiratory effort.  No retractions. Lungs CTAB. Gastrointestinal: Soft and nontender. No distention. Rectal exam with dark stool which is strongly heme positive. Not grossly bloody. Unclear if it is dark because of GI bleeding or from iron supplementation. Musculoskeletal: Equal and bilateral moderate lower extremity edema.. Neurologic:  Normal speech and language. No gross focal neurologic deficits are appreciated. Skin:  Skin is warm, dry and intact. No rash noted. Psychiatric: Mood and affect are normal. Speech and behavior are normal.  ____________________________________________   LABS (all labs ordered are listed, but only abnormal results are displayed)  Labs Reviewed - No data to display ____________________________________________  EKG   ____________________________________________  RADIOLOGY   ____________________________________________   PROCEDURES   Procedures  ____________________________________________   INITIAL IMPRESSION / ASSESSMENT AND PLAN / ED COURSE  Pertinent labs & imaging results that were available during my care of the patient were reviewed by me and considered in my medical decision making (see chart for details).  Patient with symptomatic anemia from GI bleeding. Will be admitted to the hospital. Prior to admission and I did ask the patient's family if they would like her admitted because she is a DO NOT RESUSCITATE and has outlived hospice twice. However, they stated that she would like her to be admitted at this time.  Clinical Course      ____________________________________________   FINAL CLINICAL IMPRESSION(S) / ED DIAGNOSES  GI bleeding.    NEW MEDICATIONS STARTED DURING THIS VISIT:  New Prescriptions   No medications on file     Note:  This document was prepared using Dragon voice recognition software and may include unintentional dictation errors.    Myrna Blazer, MD 03/24/16 [phone]

## 2016-03-25 DIAGNOSIS — N390 Urinary tract infection, site not specified: Secondary | ICD-10-CM | POA: Diagnosis not present

## 2016-03-25 DIAGNOSIS — N179 Acute kidney failure, unspecified: Secondary | ICD-10-CM | POA: Diagnosis not present

## 2016-03-25 DIAGNOSIS — K922 Gastrointestinal hemorrhage, unspecified: Secondary | ICD-10-CM | POA: Diagnosis not present

## 2016-03-25 DIAGNOSIS — D62 Acute posthemorrhagic anemia: Secondary | ICD-10-CM | POA: Diagnosis not present

## 2016-03-25 LAB — BASIC METABOLIC PANEL
ANION GAP: 8 (ref 5–15)
BUN: 56 mg/dL — AB (ref 6–20)
CALCIUM: 9.2 mg/dL (ref 8.9–10.3)
CO2: 23 mmol/L (ref 22–32)
CREATININE: 2.55 mg/dL — AB (ref 0.44–1.00)
Chloride: 109 mmol/L (ref 101–111)
GFR calc Af Amer: 18 mL/min — ABNORMAL LOW (ref >60)
GFR calc non Af Amer: 16 mL/min — ABNORMAL LOW (ref >60)
GLUCOSE: 134 mg/dL — AB (ref 65–99)
Potassium: 4.1 mmol/L (ref 3.5–5.1)
Sodium: 140 mmol/L (ref 135–145)

## 2016-03-25 LAB — CBC
HCT: 31.7 % — ABNORMAL LOW (ref 35.0–47.0)
HEMATOCRIT: 31.8 % — AB (ref 35.0–47.0)
HEMOGLOBIN: 10.7 g/dL — AB (ref 12.0–16.0)
Hemoglobin: 10.7 g/dL — ABNORMAL LOW (ref 12.0–16.0)
MCH: 29.5 pg (ref 26.0–34.0)
MCH: 29.4 pg (ref 26.0–34.0)
MCHC: 33.5 g/dL (ref 32.0–36.0)
MCHC: 33.7 g/dL (ref 32.0–36.0)
MCV: 88.3 fL (ref 80.0–100.0)
MCV: 87.3 fL (ref 80.0–100.0)
PLATELETS: 276 10*3/uL (ref 150–440)
Platelets: 284 10*3/uL (ref 150–440)
RBC: 3.61 MIL/uL — ABNORMAL LOW (ref 3.80–5.20)
RBC: 3.63 MIL/uL — ABNORMAL LOW (ref 3.80–5.20)
RDW: 17.2 % — AB (ref 11.5–14.5)
RDW: 16.7 % — AB (ref 11.5–14.5)
WBC: 7.7 10*3/uL (ref 3.6–11.0)
WBC: 7 10*3/uL (ref 3.6–11.0)

## 2016-03-25 LAB — TYPE AND SCREEN
ABO/RH(D): B NEG
Antibody Screen: NEGATIVE
UNIT DIVISION: 0

## 2016-03-25 MED ORDER — HYDRALAZINE HCL 20 MG/ML IJ SOLN
10.0000 mg | Freq: Once | INTRAMUSCULAR | Status: AC
  Administered 2016-03-25: 10 mg via INTRAVENOUS
  Filled 2016-03-25: qty 1

## 2016-03-25 MED ORDER — FAMOTIDINE IN NACL 20-0.9 MG/50ML-% IV SOLN
20.0000 mg | INTRAVENOUS | Status: DC
  Filled 2016-03-25: qty 50

## 2016-03-25 MED ORDER — HYDRALAZINE HCL 20 MG/ML IJ SOLN
10.0000 mg | INTRAMUSCULAR | Status: DC | PRN
  Administered 2016-03-25 – 2016-03-26 (×4): 10 mg via INTRAVENOUS
  Filled 2016-03-25 (×4): qty 1

## 2016-03-25 MED ORDER — FERROUS GLUCONATE 324 (38 FE) MG PO TABS
324.0000 mg | ORAL_TABLET | Freq: Every day | ORAL | Status: DC
  Administered 2016-03-26: 324 mg via ORAL
  Filled 2016-03-25: qty 1

## 2016-03-25 NOTE — Progress Notes (Signed)
[facility] [place] at [facility]   PATIENT NAME: Melissa Bright    MR#:  [id number]  DATE OF BIRTH:  02/02/1924  SUBJECTIVE:  CHIEF COMPLAINT:   Chief Complaint  Patient presents with  . Abnormal Lab  The patient is a 82 year old Caucasian female with past medical history significant for history of arthritis, anemia, congestive heart failure, coronary artery disease, essential hypertension, renal insufficiency, stroke, who presents to the hospital with anemia of 7.7. Apparently, the patient had maroon stool yesterday, she had labs done, revealing hemoglobin level of 7.7, and worsening renal failure, creatinine of 2.8. She was admitted to the hospital for further evaluation and treatment, initiated on Protonix orally, and her bleeding subsided. She was transfused packed red blood cells after which hemoglobin level improved. She was initiated on IV fluids, her creatinine also improved to 2.5 today. She feels better overall. Denies any pain  Review of Systems  Constitutional: Negative for chills, fever and weight loss.  HENT: Negative for congestion.   Eyes: Negative for blurred vision and double vision.  Respiratory: Negative for cough, sputum production, shortness of breath and wheezing.   Cardiovascular: Negative for chest pain, palpitations, orthopnea, leg swelling and PND.  Gastrointestinal: Negative for abdominal pain, blood in stool, constipation, diarrhea, nausea and vomiting.  Genitourinary: Negative for dysuria, frequency, hematuria and urgency.  Musculoskeletal: Negative for falls.  Neurological: Negative for dizziness, tremors, focal weakness and headaches.  Endo/Heme/Allergies: Does not bruise/bleed easily.  Psychiatric/Behavioral: Negative for depression. The patient does not have insomnia.     VITAL SIGNS: Blood pressure (!) 156/68, pulse 82, temperature 98 F (36.7 C), temperature source Oral, resp. rate 20, height  (1.651 m), weight 70.9 kg  (156 lb 6.4 oz), SpO2 98 %.  PHYSICAL EXAMINATION:   GENERAL:  82 y.o.-year-old patient lying in the bed with no acute distress.  EYES: Pupils equal, round, reactive to light and accommodation. No scleral icterus. Extraocular muscles intact.  HEENT: Head atraumatic, normocephalic. Oropharynx and nasopharynx clear.  NECK:  Supple, no jugular venous distention. No thyroid enlargement, no tenderness.  LUNGS: Normal breath sounds bilaterally, no wheezing, rales,rhonchi or crepitation. No use of accessory muscles of respiration.  CARDIOVASCULAR: S1, S2 normal. No murmurs, rubs, or gallops.  ABDOMEN: Soft, nontender, nondistended. Bowel sounds present. No organomegaly or mass.  EXTREMITIES: No pedal edema, cyanosis, or clubbing.  NEUROLOGIC: Cranial nerves II through XII are intact. Muscle strength 5/5 in all extremities. Sensation intact. Gait not checked.  PSYCHIATRIC: The patient is alert and oriented x 3.  SKIN: No obvious rash, lesion, or ulcer.   ORDERS/RESULTS REVIEWED:   CBC  Recent Labs Lab 03/25/16 0447 03/25/16 0946  WBC 7.7 7.0  HGB 10.7* 10.7*  HCT 31.8* 31.7*  PLT 276 284  MCV 88.3 87.3  MCH 29.5 29.4  MCHC 33.5 33.7  RDW 17.2* 16.7*   ------------------------------------------------------------------------------------------------------------------  Chemistries   Recent Labs Lab 03/25/16 0447  NA 140  K 4.1  CL 109  CO2 23  GLUCOSE 134*  BUN 56*  CREATININE 2.55*  CALCIUM 9.2   ------------------------------------------------------------------------------------------------------------------ estimated creatinine clearance is 14.5 mL/min (by C-G formula based on SCr of 2.55 mg/dL). ------------------------------------------------------------------------------------------------------------------ No results for input(s): TSH, T4TOTAL, T3FREE, THYROIDAB in the last 72 hours.  Invalid input(s): FREET3  Cardiac Enzymes No results for input(s): CKMB,  TROPONINI, MYOGLOBIN in the last 168 hours.  Invalid input(s): CK ------------------------------------------------------------------------------------------------------------------ Invalid input(s): POCBNP ---------------------------------------------------------------------------------------------------------------  RADIOLOGY: No results found.  EKG:  Orders placed or  performed during the hospital encounter of 11/20/15  . ED EKG  . ED EKG  . EKG 12-Lead  . EKG 12-Lead    ASSESSMENT AND PLAN:  Active Problems:   Symptomatic anemia #1. Acute posthemorrhagic anemia, status post transfusion, hemoglobin level has improved to 10.7, and is stable, follow tomorrow morning #2. Gastrointestinal bleeding, continue Protonix, for liquid diet, advance to soft diet as tolerated, watching for bleeding recurrence #3acute on chronic renal failure, improved with transfusion and IV fluid administration, follow patient's creatinine in the morning, urinalysis revealed pyuria, get urine culture, patient is asymptomatic, will not initiate antibiotic therapy at present #4. Pyuria, patient is asymptomatic, getting urinary culture, initiate antibiotics if culture is positive   Management plans discussed with the patient, family and they are in agreement.   DRUG ALLERGIES:  Allergies  Allergen Reactions  . Macrobid [employer] Macro]     CODE STATUS:     Code Status Orders        Start     Ordered   03/24/16 2008  Do not attempt resuscitation (DNR)  Continuous    Question Answer Comment  In the event of cardiac or respiratory ARREST Do not call a "code blue"   In the event of cardiac or respiratory ARREST Do not perform Intubation, CPR, defibrillation or ACLS   In the event of cardiac or respiratory ARREST Use medication by any route, position, wound care, and other measures to relive pain and suffering. May use oxygen, suction and manual treatment of airway obstruction as needed for  comfort.      03/24/16 2008    Code Status History    Date Active Date Inactive Code Status Order ID Comments User Context   11/21/2015 11:30 AM 11/22/2015  6:31 PM DNR [id number]  Enedina Finner, MD Inpatient   11/20/2015  1:12 PM 11/21/2015 11:30 AM Full Code [id number]  Katharina Caper, MD ED   11/09/2015  4:26 PM 11/10/2015  5:19 PM DNR [id number]  Houston Siren, MD Inpatient    Advance Directive Documentation   Flowsheet Row Most Recent Value  Type of Advance Directive  Healthcare Power of Attorney, Out of facility DNR (pink MOST or yellow form)  Pre-existing out of facility DNR order (yellow form or pink MOST form)  No data  "MOST" Form in Place?  No data      TOTAL TIME TAKING CARE OF THIS PATIENT: 40 minutes.  Discussed with patient's daughter  Katharina Caper M.D on 03/25/2016 at 12:28 PM  Between 7am to 6pm - Pager - [phone]  After 6pm go to [email or web address] - password [email or web address] [facility]  [place] [facility]  Office  [phone]  CC: Primary care physician; Marisue Ivan, MD

## 2016-03-25 NOTE — Care Management Obs Status (Signed)
MEDICARE OBSERVATION STATUS NOTIFICATION   Patient Details  Name: Melissa Bright MRN: [id number] Date of Birth: 1924-03-08   Medicare Observation Status Notification Given:  Yes (reviewed with patien'ts daughter. and signed) Signed copy sent to HIM    Chapman Fitch, RN 03/25/2016, 1:40 PM

## 2016-03-25 NOTE — Care Management (Signed)
Patient admitted from home with Acute posthemorrhagic anemia, status post transfusion.  Patient lives in her home, and children provide care.  Her son spend the night in the home, and 2 daughters stay during the day/evening.  Children provide transportation when needed.  Patient has a WC, walker, BSC, and shower chair in the home.  [facility]  [facility] was [facility] and discharge from their services 03/20/16.  Per daughter at baseline patient is able to pivot from bed to [facility].  PT consult pending.  If home health appropriate at discharge, [facility] should be considered.  Daughter sates that they do not have a preference of home health companies.

## 2016-03-25 NOTE — Progress Notes (Signed)
Physical Therapy Evaluation Patient Details Name: Melissa Bright MRN: [id number] DOB: 03/19/1924 Today's Date: 03/25/2016   History of Present Illness  Pt is a 82 y/o female admitted with symptomatic anemia secondary to a GI bleed. Pt hgb upon admission was 7.7 and has increased to 10.7. PMH includes HTN, CHF, CAD, osteoarthiritis, and chronic kidney disease.  Clinical Impression  Pt is a pleasant 82 y/o female who presents with generalized weakness. PLOF: Pt lives with her 3 adult children and is dependent on them for ADLs and relies on them to transfer to [facility] (+2 pivot transfer with a lift). Pt has a good family support system. Pt is min assist with bed mobility and +2 mod-max assist for standing pivot transfer to chair. Per daughter pt is at her baseline. Pt requires verbal cueing for anterior lean and foot placement with standing pivot transfer. Pt is impulsive at times, trying to perform bed mobility before PT cues her.  Pt will benefit from skilled PT to improve strength, balance, and functional mobility at home. Pt is appropriate for HHPT at this time as long as she continues to have 24 hour assistance at home.     Follow Up Recommendations Home health PT    Equipment Recommendations       Recommendations for Other Services       Precautions / Restrictions Precautions Precautions: Fall Restrictions Weight Bearing Restrictions: No      Mobility  Bed Mobility Overal bed mobility: Needs Assistance Bed Mobility: Supine to Sit     Supine to sit: Min assist     General bed mobility comments: Pt able to move from supine to sit with min assist but required mod assist to scoot to EOB.    Transfers Overall transfer level: Needs assistance Equipment used: None Transfers: Stand Pivot Transfers   Stand pivot transfers: Mod assist;Max assist;+2 physical assistance       General transfer comment: Pt requires +2 mod-max assist for pivot transfers with verbal cues to increase  anterior lean and foot placement. Per daughter, this is her baseline for transfers  Ambulation/Gait             General Gait Details: Not attempted  Stairs            Wheelchair Mobility    Modified Rankin (Stroke Patients Only)       Balance Overall balance assessment: Needs assistance Sitting-balance support: Feet supported;Bilateral upper extremity supported Sitting balance-Leahy Scale: Fair Sitting balance - Comments: Pt demonstrates posterior lean with sitting balance with B UE supported on bed.  Postural control: Posterior lean                                   Pertinent Vitals/Pain Pain Assessment: No/denies pain    Home Living Family/patient expects to be discharged to:: Private residence Living Arrangements: Children Available Help at Discharge: Family Type of Home: House         Home Equipment: Dan Humphreys - 2 wheels (A lift) Additional Comments: Pt lives at home and has 24 hour supervision from her 3 adult children. They use a lift with +2 assistance to transfer her to the [facility].    Prior Function Level of Independence: Needs assistance   Gait / Transfers Assistance Needed: Pt's children use a lift with +2 assistance to transfer her to the [facility]. Pt does not ambulate at baseline.  ADL's / Homemaking Assistance Needed: Pt's children  assist with all ADLs.        Hand Dominance        Extremity/Trunk Assessment   Upper Extremity Assessment: Generalized weakness (elbow flex/ext: 3+/5)           Lower Extremity Assessment: Generalized weakness (5 SLRs with knee slightly bent)         Communication   Communication: No difficulties  Cognition Arousal/Alertness: Awake/alert Behavior During Therapy: Impulsive Overall Cognitive Status: Within Functional Limits for tasks assessed                      General Comments      Exercises        Assessment/Plan    PT Assessment Patient needs continued PT services  PT  Diagnosis Generalized weakness   PT Problem List Decreased strength;Decreased activity tolerance;Decreased balance;Decreased mobility  PT Treatment Interventions Functional mobility training;Therapeutic activities;Therapeutic exercise;Balance training;Patient/family education   PT Goals (Current goals can be found in the Care Plan section) Acute Rehab PT Goals Patient Stated Goal: To return home PT Goal Formulation: With patient Time For Goal Achievement: 04/08/16 Potential to Achieve Goals: Good    Frequency Min 2X/week   Barriers to discharge        Co-evaluation               End of Session Equipment Utilized During Treatment: Gait belt Activity Tolerance: Patient tolerated treatment well Patient left: in chair;with call bell/phone within reach;with chair alarm set;with family/visitor present Nurse Communication: Mobility status (+2 transfer)         Time: 1541-1600 PT Time Calculation (min) (ACUTE ONLY): 19 min   Charges:         PT G Codes:        Thereasa Parkin 04-04-2016, 4:30 PM Thereasa Parkin, SPT [phone]

## 2016-03-25 NOTE — Progress Notes (Signed)
Prime doc notified of persistent elevated BP despite prn PO hydralazine and IV hydralazine x1. Ordered 10 mg IV hydralazine q4h prn for BP>160/90. Will continue to monitor.

## 2016-03-26 ENCOUNTER — Observation Stay: Payer: Medicare Other

## 2016-03-26 DIAGNOSIS — E872 Acidosis: Secondary | ICD-10-CM | POA: Diagnosis not present

## 2016-03-26 DIAGNOSIS — N184 Chronic kidney disease, stage 4 (severe): Secondary | ICD-10-CM | POA: Diagnosis not present

## 2016-03-26 DIAGNOSIS — K922 Gastrointestinal hemorrhage, unspecified: Secondary | ICD-10-CM | POA: Diagnosis not present

## 2016-03-26 DIAGNOSIS — R8281 Pyuria: Secondary | ICD-10-CM | POA: Insufficient documentation

## 2016-03-26 DIAGNOSIS — I10 Essential (primary) hypertension: Secondary | ICD-10-CM

## 2016-03-26 DIAGNOSIS — D631 Anemia in chronic kidney disease: Secondary | ICD-10-CM | POA: Diagnosis not present

## 2016-03-26 DIAGNOSIS — N179 Acute kidney failure, unspecified: Secondary | ICD-10-CM

## 2016-03-26 DIAGNOSIS — D62 Acute posthemorrhagic anemia: Secondary | ICD-10-CM

## 2016-03-26 DIAGNOSIS — F0391 Unspecified dementia with behavioral disturbance: Secondary | ICD-10-CM

## 2016-03-26 DIAGNOSIS — N390 Urinary tract infection, site not specified: Secondary | ICD-10-CM | POA: Diagnosis not present

## 2016-03-26 DIAGNOSIS — N189 Chronic kidney disease, unspecified: Secondary | ICD-10-CM

## 2016-03-26 DIAGNOSIS — F03918 Unspecified dementia, unspecified severity, with other behavioral disturbance: Secondary | ICD-10-CM

## 2016-03-26 LAB — HEMOGLOBIN: HEMOGLOBIN: 9.4 g/dL — AB (ref 12.0–16.0)

## 2016-03-26 LAB — CREATININE, SERUM
Creatinine, Ser: 2.56 mg/dL — ABNORMAL HIGH (ref 0.44–1.00)
GFR calc Af Amer: 18 mL/min — ABNORMAL LOW (ref >60)
GFR calc non Af Amer: 15 mL/min — ABNORMAL LOW (ref >60)

## 2016-03-26 MED ORDER — CARVEDILOL 12.5 MG PO TABS
25.0000 mg | ORAL_TABLET | Freq: Two times a day (BID) | ORAL | Status: DC
  Administered 2016-03-26: 25 mg via ORAL
  Filled 2016-03-26: qty 2

## 2016-03-26 MED ORDER — PANTOPRAZOLE SODIUM 40 MG PO TBEC: 40.0000 mg | DELAYED_RELEASE_TABLET | Freq: Two times a day (BID) | ORAL | 5 refills | Status: DC

## 2016-03-26 MED ORDER — HYDRALAZINE HCL 50 MG PO TABS
50.0000 mg | ORAL_TABLET | Freq: Three times a day (TID) | ORAL | Status: DC
  Administered 2016-03-26 (×2): 50 mg via ORAL
  Filled 2016-03-26 (×2): qty 1

## 2016-03-26 MED ORDER — CARVEDILOL 25 MG PO TABS: 25.0000 mg | ORAL_TABLET | Freq: Two times a day (BID) | ORAL | 5 refills | Status: DC

## 2016-03-26 MED ORDER — POLYETHYLENE GLYCOL 3350 17 G PO PACK
17.0000 g | PACK | Freq: Every day | ORAL | Status: DC | PRN
  Administered 2016-03-26: 17 g via ORAL
  Filled 2016-03-26: qty 1

## 2016-03-26 MED ORDER — HYDRALAZINE HCL 50 MG PO TABS: 50.0000 mg | ORAL_TABLET | Freq: Three times a day (TID) | ORAL | 5 refills | Status: DC

## 2016-03-26 NOTE — Consult Note (Signed)
[facility] CONSULT NOTE    Date: 03/26/2016                  Patient Name:  Melissa Bright  MRN: [id number]  DOB: 06-12-1924  Age / Sex: 82 y.o., female         PCP: Marisue Ivan, MD                 Service Requesting Consult: Dr. Winona Legato                 Reason for Consult: Chronic kidney disease stage IV            History of Present Illness: Patient is a 82 y.o. female with a PMHx of osteoarthritis, congestive heart failure, coronary artery disease, hypertension, chronic kidney disease stage IV, history of stroke, who was admitted to [facility] on 03/24/2016 for evaluation of symptomatic anemia.  Patient unfortunately is a poor historian.  Her daughter is at bedside and provides a history.  Apparently the patient was recently on hospice but was transitioned off as her condition stabilized and improved.  When she presented this time she was having maroon-colored stools at home and began having weakness.  We are consulted for the evaluation management of chronic kidney disease stage IV.  Her baseline creatinine appears to be 2.1.  Creatinine now appears to be slightly higher ranging between 2.4-2.5.  In the past she was following with [facility] nephrology but it has been sometime since she seen them.   Medications: Outpatient medications: Prescriptions Prior to Admission  Medication Sig Dispense Refill Last Dose  . amLODipine (NORVASC) 5 MG tablet Take 5 mg by mouth daily.   03/24/2016 at Unknown time  . atorvastatin (LIPITOR) 40 MG tablet Take 40 mg by mouth daily.   03/23/2016 at Unknown time  . carvedilol (COREG) 6.25 MG tablet Take 1 tablet (6.25 mg total) by mouth 2 (two) times daily. 60 tablet 0 03/24/2016 at Unknown time  . docusate sodium (COLACE) 100 MG capsule Take 100 mg by mouth 2 (two) times daily.   03/24/2016 at Unknown time  . ferrous gluconate (FERGON) 240 (27 FE) MG tablet Take 1 tablet (240 mg total) by mouth daily.   03/24/2016 at Unknown time  . folic  acid (FOLVITE) 800 MCG tablet Take 800 mcg by mouth daily.   03/24/2016 at Unknown time  . furosemide (LASIX) 20 MG tablet Take 1 tablet (20 mg total) by mouth daily as needed (Take one daily as needed for increased shortness of breath.  Notify Hospice  if needing to take two days in a row). 30 tablet 0 03/23/2016 at unknown  . hydrALAZINE (APRESOLINE) 50 MG tablet Take 25 mg by mouth 2 (two) times daily as needed. Take if SBP is greater than 180 and/or DBP is greater than 110   PRN at PRN  . ipratropium-albuterol (DUONEB) 0.5-2.5 (3) MG/3ML SOLN Take 3 mLs by nebulization every 4 (four) hours as needed. 360 mL 0 PRN at PRN   . isosorbide mononitrate (IMDUR) 30 MG 24 hr tablet Take 30 mg by mouth daily.   03/24/2016 at Unknown time  . latanoprost (XALATAN) 0.005 % ophthalmic solution Place 1 drop into both eyes at bedtime.   03/23/2016 at Unknown time  . levothyroxine (SYNTHROID, LEVOTHROID) 112 MCG tablet Take 112 mcg by mouth daily.   03/24/2016 at Unknown time  . omega-3 acid ethyl esters (LOVAZA) 1 g capsule Take 1 g by mouth daily.  03/24/2016 at Unknown time  . pantoprazole (PROTONIX) 40 MG tablet Take 40 mg by mouth 2 (two) times daily.   03/24/2016 at Unknown time  . senna-docusate (SENOKOT-S) 8.6-50 MG tablet Take 1 tablet by mouth at bedtime as needed for mild constipation.   PRN at PRN  . sodium bicarbonate 650 MG tablet Take 650 mg by mouth 2 (two) times daily.   03/24/2016 at Unknown time  . vitamin B-12 (CYANOCOBALAMIN) 1000 MCG tablet Take 1,000 mcg by mouth daily.   03/24/2016 at Unknown time    Current medications: Current Facility-Administered Medications  Medication Dose Route Frequency Provider Last Rate Last Dose  . acetaminophen (TYLENOL) tablet 650 mg  650 mg Oral Q6H PRN Houston Siren, MD   650 mg at 03/26/16 0913   Or  . acetaminophen (TYLENOL) suppository 650 mg  650 mg Rectal Q6H PRN Houston Siren, MD      . amLODipine (NORVASC) tablet 5 mg  5 mg Oral Daily Houston Siren,  MD   5 mg at 03/26/16 0914  . atorvastatin (LIPITOR) tablet 40 mg  40 mg Oral Daily Houston Siren, MD   40 mg at 03/26/16 0913  . carvedilol (COREG) tablet 25 mg  25 mg Oral BID Katharina Caper, MD   25 mg at 03/26/16 0913  . docusate sodium (COLACE) capsule 100 mg  100 mg Oral BID Houston Siren, MD   100 mg at 03/26/16 0914  . [START ON 03/27/2016] famotidine (PEPCID) IVPB 20 mg premix  20 mg Intravenous Q48H Myrna Blazer, MD      . ferrous gluconate [facility]) tablet 324 mg  324 mg Oral Daily Katharina Caper, MD   324 mg at 03/26/16 0913  . folic acid (FOLVITE) tablet 1 mg  1,000 mcg Oral Daily Houston Siren, MD   1 mg at 03/26/16 0913  . hydrALAZINE (APRESOLINE) injection 10 mg  10 mg Intravenous Q4H PRN Ihor Austin, MD   10 mg at 03/26/16 0507  . hydrALAZINE (APRESOLINE) tablet 50 mg  50 mg Oral Q8H Katharina Caper, MD   50 mg at 03/26/16 0913  . ipratropium-albuterol (DUONEB) 0.5-2.5 (3) MG/3ML nebulizer solution 3 mL  3 mL Nebulization Q4H PRN Houston Siren, MD      . isosorbide mononitrate (IMDUR) 24 hr tablet 30 mg  30 mg Oral Daily Houston Siren, MD   30 mg at 03/26/16 0913  . latanoprost (XALATAN) 0.005 % ophthalmic solution 1 drop  1 drop Both Eyes QHS Houston Siren, MD   1 drop at 03/25/16 2001  . levothyroxine (SYNTHROID, LEVOTHROID) tablet 112 mcg  112 mcg Oral QAC breakfast Houston Siren, MD   112 mcg at 03/26/16 0913  . omega-3 acid ethyl esters (LOVAZA) capsule 1 g  1 g Oral Daily Houston Siren, MD   1 g at 03/26/16 0913  . ondansetron (ZOFRAN) tablet 4 mg  4 mg Oral Q6H PRN Houston Siren, MD       Or  . ondansetron (ZOFRAN) injection 4 mg  4 mg Intravenous Q6H PRN Houston Siren, MD      . pantoprazole (PROTONIX) EC tablet 40 mg  40 mg Oral BID Houston Siren, MD   40 mg at 03/26/16 0913  . polyethylene glycol (MIRALAX / GLYCOLAX) packet 17 g  17 g Oral Daily PRN Katharina Caper, MD   17 g at 03/26/16 1125  . senna-docusate (Senokot-S) tablet 1 tablet  1  tablet Oral QHS PRN Houston Siren, MD      . sodium bicarbonate tablet 650 mg  650 mg Oral BID Houston Siren, MD   650 mg at 03/26/16 0913  . vitamin B-12 (CYANOCOBALAMIN) tablet 1,000 mcg  1,000 mcg Oral Daily Houston Siren, MD   1,000 mcg at 03/26/16 3419      Allergies: Allergies  Allergen Reactions  . Macrobid [employer] Macro]       Past Medical History: Past Medical History:  Diagnosis Date  . Arthritis   . Blood transfusion without reported diagnosis   . CHF (congestive heart failure) (HCC)   . Coronary artery disease   . Hypertension   . Renal disorder   . Stroke [facility])      Past Surgical History: Past Surgical History:  Procedure Laterality Date  . ABDOMINAL HYSTERECTOMY    . CARDIAC SURGERY    . EYE SURGERY    . JOINT REPLACEMENT       Family History: Family History  Problem Relation Age of Onset  . Heart attack Father      Social History: Social History   Social History  . Marital status: Widowed    Spouse name: N/A  . Number of children: N/A  . Years of education: N/A   Occupational History  . Not on file.   Social History Main Topics  . Smoking status: Former Games developer  . Smokeless tobacco: Never Used  . Alcohol use No  . Drug use: No  . Sexual activity: Not on file   Other Topics Concern  . Not on file   Social History Narrative  . No narrative on file     Review of Systems: Patient unable to provide due to inability to concentrate on questions.  Vital Signs: Blood pressure (!) 108/49, pulse (!) 56, temperature 97.8 F (36.6 C), resp. rate 16, height 5\' 5"  (1.651 m), weight 70.9 kg (156 lb 6.4 oz), SpO2 97 %.  Weight trends: Filed Weights   03/24/16 1701 03/24/16 2015  Weight: 69.9 kg (154 lb 3.2 oz) 70.9 kg (156 lb 6.4 oz)    Physical Exam: General: NAD, resting comfortably  Head: Normocephalic, atraumatic.  Eyes: Anicteric, EOMI  Nose: Mucous membranes moist, not inflammed, nonerythematous.  Throat:  Oropharynx nonerythematous, no exudate appreciated.   Neck: Supple, trachea midline.  Lungs:  Normal respiratory effort. Clear to auscultation BL without crackles or wheezes.  Heart: RRR. S1 and S2 normal without gallop, murmur, or rubs.  Abdomen:  BS normoactive. Soft, Nondistended, non-tender.  No masses or organomegaly.  Extremities: No pretibial edema.  Neurologic: A&O X3, Motor strength is 5/5 in the all 4 extremities  Skin: No visible rashes, scars.    Lab results: Basic Metabolic Panel:  Recent Labs Lab 03/25/16 0447 03/26/16 0407  NA 140  --   K 4.1  --   CL 109  --   CO2 23  --   GLUCOSE 134*  --   BUN 56*  --   CREATININE 2.55* 2.56*  CALCIUM 9.2  --     Liver Function Tests: No results for input(s): AST, ALT, ALKPHOS, BILITOT, PROT, ALBUMIN in the last 168 hours. No results for input(s): LIPASE, AMYLASE in the last 168 hours. No results for input(s): AMMONIA in the last 168 hours.  CBC:  Recent Labs Lab 03/25/16 0447 03/25/16 0946 03/26/16 0407  WBC 7.7 7.0  --   HGB 10.7* 10.7* 9.4*  HCT 31.8* 31.7*  --  MCV 88.3 87.3  --   PLT 276 284  --     Cardiac Enzymes: No results for input(s): CKTOTAL, CKMB, CKMBINDEX, TROPONINI in the last 168 hours.  BNP: Invalid input(s): POCBNP  CBG: No results for input(s): GLUCAP in the last 168 hours.  Microbiology: Results for orders placed or performed during the hospital encounter of 11/20/15  Culture, blood (Routine X 2) w Reflex to ID Panel     Status: None   Collection Time: 11/20/15  8:58 AM  Result Value Ref Range Status   Specimen Description BLOOD LEFT WRIST  Final   Special Requests BOTTLES DRAWN AEROBIC AND ANAEROBIC 2CCAERO,2CCANA  Final   Culture NO GROWTH 5 DAYS  Final   Report Status 11/25/2015 FINAL  Final  Culture, blood (Routine X 2) w Reflex to ID Panel     Status: None   Collection Time: 11/20/15  8:58 AM  Result Value Ref Range Status   Specimen Description BLOOD LEFT ARM  Final    Special Requests BOTTLES DRAWN AEROBIC AND ANAEROBIC 3CCAERO,2CCANA  Final   Culture NO GROWTH 5 DAYS  Final   Report Status 11/25/2015 FINAL  Final    Coagulation Studies: No results for input(s): LABPROT, INR in the last 72 hours.  Urinalysis:  Recent Labs  03/24/16 1853  COLORURINE YELLOW*  LABSPEC 1.012  PHURINE 6.0  GLUCOSEU NEGATIVE  HGBUR NEGATIVE  BILIRUBINUR NEGATIVE  KETONESUR NEGATIVE  PROTEINUR 100*  NITRITE NEGATIVE  LEUKOCYTESUR 1+*      Imaging: US Renal  Result Date: 03/26/2016 CLINICAL DATA:  Acute renal failure EXAM: RENAL / URINARY TRACT ULTRASOUND COMPLETE COMPARISON:  Abdominal ultrasound of November 14, 2010 and abdominal and pelvic CT scan of September 03, 2007 FINDINGS: Right Kidney: Length: 4.6 cm. Diffuse cortical thinning. No hydronephrosis observed. The renal cortical echotexture is increase with respect to the liver. These findings are similar to those seen on the CT scan of September 03, 2007. The right kidney was not visualized on the abdominal ultrasound of November 14, 2010. Left Kidney: Length: 8.7 cm which has decreased since the March 2012 ultrasound at which time the kidney measured 10.56 cm. The renal cortical echotexture is equal to or lower than that of the adjacent liver. No hydronephrosis Bladder: Normal left ureteral jet. No right ureteral jet observed over 3 minutes. The partially distended urinary bladder is normal. IMPRESSION: 1. Chronic atrophy and likely nonfunctional E or minimally functioning right kidney. 2. Atrophy of the left kidney since the 2012 ultrasound. No evidence of obstruction. 3. Normal appearance of the urinary bladder. 4. Incidental note is made of gallstones which have been previously demonstrated. A complex septated appearance of the gallbladder is observed. A dedicated limited gallbladder ultrasound is recommended. Electronically Signed   By: David  [place] M.D.   On: 03/26/2016 10:58     Assessment & Plan: Pt is a 82 y.o.  female with a PMHx of osteoarthritis, congestive heart failure, coronary artery disease, hypertension, chronic kidney disease stage IV, history of stroke, who was admitted to [facility] on 03/24/2016 for evaluation of symptomatic anemia.   1.  Chronic kidney disease stage IV. 2.  Anemia of CKD and of blood loss 3.  Metabolic acidosis on bicarbonate replacement. 4.  Hypertension.  Plan: In reviewing the patient's renal ultrasound it appears that she has chronic atrial feet of the right kidney which is likely minimally functioning.  There is also left renal atrophy.  These findings are compatible with chronic kidney disease.  In her current state the patient would not make a good candidate for renal replacement therapy.  At this point in time we would recommend continued supportive care.  Incidentally there was a complex septated appearance of the gallbladder that was noted.  I defer evaluation of this to the hospitalist.  I offered the family followup for the patient's underlying chronic kidney disease in the office however patient's family states that it is difficult for her to travel.  I recommend that her renal function be periodically tested by her primary care physician.  Thanks for consultation.

## 2016-03-26 NOTE — Discharge Summary (Signed)
[facility] [place] at [facility]   PATIENT NAME: Melissa Bright    MR#:  [id number]  DATE OF BIRTH:  05-20-1924  DATE OF ADMISSION:  03/24/2016 ADMITTING PHYSICIAN: Houston Siren, MD  DATE OF DISCHARGE: No discharge date for patient encounter.  PRIMARY CARE PHYSICIAN: Marisue Ivan, MD     ADMISSION DIAGNOSIS:  Acute GI bleeding [K92.2]  DISCHARGE DIAGNOSIS:  Active Problems:   Symptomatic anemia   Acute posthemorrhagic anemia   GIB (gastrointestinal bleeding)   Acute on chronic renal failure (HCC)   Pyuria   Essential hypertension, malignant   Dementia with behavioral disturbance   SECONDARY DIAGNOSIS:   Past Medical History:  Diagnosis Date  . Arthritis   . Blood transfusion without reported diagnosis   . CHF (congestive heart failure) (HCC)   . Coronary artery disease   . Hypertension   . Renal disorder   . Stroke (HCC)     .pro HOSPITAL COURSE:  The patient is an 82 year old Caucasian female with past medical history significant for history of arthritis, anemia, congestive heart failure, coronary artery disease, essential hypertension, renal insufficiency, stroke, who presents to the hospital with anemia of 7.7. Apparently, the patient had maroon stool  a day before admission, she had labs done, revealing hemoglobin level of 7.7, and worsening renal failure, creatinine of 2.8. She was admitted to the hospital for further evaluation and treatment, initiated on Protonix orally, and her bleeding subsided. She was transfused 1 unit of packed red blood cells after which hemoglobin level improved. She was initiated on IV fluids, her creatinine also improved to 2.5 . She felt better overall. Denied any pain. While in the hospital, she was noted to have elevated blood pressure, some sundowning, improved in the morning and with medications for blood pressure management. Patient's diet was slowly advanced and she had no recurrent bleeding. It was  felt she is stable to be discharged home.  Discussion by problem: #1. Acute posthemorrhagic anemia, status post 1 unit of packed red blood cell transfusion, hemoglobin level has improved to 10.7, and is stable, follow as outpatient #2. Gastrointestinal bleeding, resolved, continue Protonix twice daily, diet was advanced to dysphagia 3 diet, following for 3 bleeding, possible discharge home if no recurrent bleeding. Gastroenterology consultation is recommended as outpatient if recurrent bleeding . No radiologic studies were performed. H. pylori testing was sent, pending.  #3acute on chronic renal failure, improved from 2.8-2.55 with transfusion and IV fluid administration, and remained stable, urinalysis revealed pyuria, urine culture was ordered, however, patient remained asymptomatic, antibody therapy was not initiated. Ultrasound of kidneys was performed, revealing poorly functioning right kidney, atrophy of left kidney. No evidence of obstruction, normal urinary bladder, gallstones, complex septated appearance of the gallbladder, right upper quadrant ultrasound was recommended.  #4. Pyuria, patient is asymptomatic, getting urinary culture, initiate antibiotics if culture is positive #5. Malignant essential hypertension, likely due to transfusion, IV fluid administration, advancing Coreg to 25 mg twice daily dose, hydralazine to 50 mg 3 times daily dose, follow blood pressure readings closely as outpatient, change medication doses as needed.  #6. Dementia with sundowning, supportive therapy, no medications were initiated DISCHARGE CONDITIONS:   Stable  CONSULTS OBTAINED:  Treatment Team:  Mady Haagensen, MD  DRUG ALLERGIES:   Allergies  Allergen Reactions  . Macrobid [Nitrofurantoin [employer] Macro]     DISCHARGE MEDICATIONS:   Current Discharge Medication List    CONTINUE these medications which have CHANGED   Details  carvedilol (COREG)  25 MG tablet Take 1 tablet (25 mg total) by mouth  2 (two) times daily. Qty: 60 tablet, Refills: 5    hydrALAZINE (APRESOLINE) 50 MG tablet Take 1 tablet (50 mg total) by mouth every 8 (eight) hours. Qty: 90 tablet, Refills: 5    pantoprazole (PROTONIX) 40 MG tablet Take 1 tablet (40 mg total) by mouth 2 (two) times daily. Qty: 60 tablet, Refills: 5      CONTINUE these medications which have NOT CHANGED   Details  amLODipine (NORVASC) 5 MG tablet Take 5 mg by mouth daily.    atorvastatin (LIPITOR) 40 MG tablet Take 40 mg by mouth daily.    docusate sodium (COLACE) 100 MG capsule Take 100 mg by mouth 2 (two) times daily.    ferrous gluconate (FERGON) 240 (27 FE) MG tablet Take 1 tablet (240 mg total) by mouth daily.    folic acid (FOLVITE) 800 MCG tablet Take 800 mcg by mouth daily.    furosemide (LASIX) 20 MG tablet Take 1 tablet (20 mg total) by mouth daily as needed (Take one daily as needed for increased shortness of breath.  Notify Hospice  if needing to take two days in a row). Qty: 30 tablet, Refills: 0    ipratropium-albuterol (DUONEB) 0.5-2.5 (3) MG/3ML SOLN Take 3 mLs by nebulization every 4 (four) hours as needed. Qty: 360 mL, Refills: 0    isosorbide mononitrate (IMDUR) 30 MG 24 hr tablet Take 30 mg by mouth daily.    latanoprost (XALATAN) 0.005 % ophthalmic solution Place 1 drop into both eyes at bedtime.    levothyroxine (SYNTHROID, LEVOTHROID) 112 MCG tablet Take 112 mcg by mouth daily.    omega-3 acid ethyl esters (LOVAZA) 1 g capsule Take 1 g by mouth daily.    senna-docusate (SENOKOT-S) 8.6-50 MG tablet Take 1 tablet by mouth at bedtime as needed for mild constipation.    sodium bicarbonate 650 MG tablet Take 650 mg by mouth 2 (two) times daily.    vitamin B-12 (CYANOCOBALAMIN) 1000 MCG tablet Take 1,000 mcg by mouth daily.         DISCHARGE INSTRUCTIONS:    Patient is to follow-up with primary care physician, gastroenterologist as needed  If you experience worsening of your admission symptoms,  develop shortness of breath, life threatening emergency, suicidal or homicidal thoughts you must seek medical attention immediately by calling 911 or calling your MD immediately  if symptoms less severe.  You Must read complete instructions/literature along with all the possible adverse reactions/side effects for all the Medicines you take and that have been prescribed to you. Take any new Medicines after you have completely understood and accept all the possible adverse reactions/side effects.   Please note  You were cared for by a hospitalist during your hospital stay. If you have any questions about your discharge medications or the care you received while you were in the hospital after you are discharged, you can call the unit and asked to speak with the hospitalist on call if the hospitalist that took care of you is not available. Once you are discharged, your primary care physician will handle any further medical issues. Please note that NO REFILLS for any discharge medications will be authorized once you are discharged, as it is imperative that you return to your primary care physician (or establish a relationship with a primary care physician if you do not have one) for your aftercare needs so that they can reassess your need for medications and monitor  your lab values.    Today   CHIEF COMPLAINT:   Chief Complaint  Patient presents with  . Abnormal Lab    HISTORY OF PRESENT ILLNESS:  Melissa Bright  is a 82 y.o. female with a known history of arthritis, anemia, congestive heart failure, coronary artery disease, essential hypertension, renal insufficiency, stroke, who presents to the hospital with anemia of 7.7. Apparently, the patient had maroon stool  a day before admission, she had labs done, revealing hemoglobin level of 7.7, and worsening renal failure, creatinine of 2.8. She was admitted to the hospital for further evaluation and treatment, initiated on Protonix orally, and her  bleeding subsided. She was transfused 1 unit of packed red blood cells after which hemoglobin level improved. She was initiated on IV fluids, her creatinine also improved to 2.5 . She felt better overall. Denied any pain. While in the hospital, she was noted to have elevated blood pressure, some sundowning, improved in the morning and with medications for blood pressure management. Patient's diet was slowly advanced and she had no recurrent bleeding. It was felt she is stable to be discharged home.  Discussion by problem: #1. Acute posthemorrhagic anemia, status post 1 unit of packed red blood cell transfusion, hemoglobin level has improved to 10.7, and is stable, follow as outpatient #2. Gastrointestinal bleeding, resolved, continue Protonix twice daily, diet was advanced to dysphagia 3 diet, following for 3 bleeding, possible discharge home if no recurrent bleeding. Gastroenterology consultation is recommended as outpatient if recurrent bleeding . No radiologic studies were performed. H. pylori testing was sent, pending.  #3acute on chronic renal failure, improved from 2.8-2.55 with transfusion and IV fluid administration, and remained stable, urinalysis revealed pyuria, urine culture was ordered, however, patient remained asymptomatic, antibody therapy was not initiated. Ultrasound of kidneys was performed, revealing poorly functioning right kidney, atrophy of left kidney. No evidence of obstruction, normal urinary bladder, gallstones, complex septated appearance of the gallbladder, right upper quadrant ultrasound was recommended.  #4. Pyuria, patient is asymptomatic, getting urinary culture, initiate antibiotics if culture is positive #5. Malignant essential hypertension, likely due to transfusion, IV fluid administration, advancing Coreg to 25 mg twice daily dose, hydralazine to 50 mg 3 times daily dose, follow blood pressure readings closely as outpatient, change medication doses as needed.  #6.  Dementia with sundowning, supportive therapy, no medications were initiated DISCHARGE CONDITIONS:     VITAL SIGNS:  Blood pressure (!) 159/78, pulse 87, temperature 98.1 F (36.7 C), temperature source Oral, resp. rate 20, height  (1.651 m), weight 70.9 kg (156 lb 6.4 oz), SpO2 97 %.  I/O:   Intake/Output Summary (Last 24 hours) at 03/26/16 1137 Last data filed at 03/26/16 0900  Gross per 24 hour  Intake              240 ml  Output                0 ml  Net              240 ml    PHYSICAL EXAMINATION:  GENERAL:  82 y.o.-year-old patient lying in the bed with no acute distress.  EYES: Pupils equal, round, reactive to light and accommodation. No scleral icterus. Extraocular muscles intact.  HEENT: Head atraumatic, normocephalic. Oropharynx and nasopharynx clear.  NECK:  Supple, no jugular venous distention. No thyroid enlargement, no tenderness.  LUNGS: Normal breath sounds bilaterally, no wheezing, rales,rhonchi or crepitation. No use of accessory muscles of respiration.  CARDIOVASCULAR: S1,  S2 normal. No murmurs, rubs, or gallops.  ABDOMEN: Soft, non-tender, non-distended. Bowel sounds present. No organomegaly or mass.  EXTREMITIES: No pedal edema, cyanosis, or clubbing.  NEUROLOGIC: Cranial nerves II through XII are intact. Muscle strength 5/5 in all extremities. Sensation intact. Gait not checked.  PSYCHIATRIC: The patient is alert and oriented x 3.  SKIN: No obvious rash, lesion, or ulcer.   DATA REVIEW:   CBC  Recent Labs Lab 03/25/16 0946 03/26/16 0407  WBC 7.0  --   HGB 10.7* 9.4*  HCT 31.7*  --   PLT 284  --     Chemistries   Recent Labs Lab 03/25/16 0447 03/26/16 0407  NA 140  --   K 4.1  --   CL 109  --   CO2 23  --   GLUCOSE 134*  --   BUN 56*  --   CREATININE 2.55* 2.56*  CALCIUM 9.2  --     Cardiac Enzymes No results for input(s): TROPONINI in the last 168 hours.  Microbiology Results  Results for orders placed or performed during the  hospital encounter of 11/20/15  Culture, blood (Routine X 2) w Reflex to ID Panel     Status: None   Collection Time: 11/20/15  8:58 AM  Result Value Ref Range Status   Specimen Description BLOOD LEFT WRIST  Final   Special Requests BOTTLES DRAWN AEROBIC AND ANAEROBIC 2CCAERO,2CCANA  Final   Culture NO GROWTH 5 DAYS  Final   Report Status 11/25/2015 FINAL  Final  Culture, blood (Routine X 2) w Reflex to ID Panel     Status: None   Collection Time: 11/20/15  8:58 AM  Result Value Ref Range Status   Specimen Description BLOOD LEFT ARM  Final   Special Requests BOTTLES DRAWN AEROBIC AND ANAEROBIC 3CCAERO,2CCANA  Final   Culture NO GROWTH 5 DAYS  Final   Report Status 11/25/2015 FINAL  Final    RADIOLOGY:  US Renal  Result Date: 03/26/2016 CLINICAL DATA:  Acute renal failure EXAM: RENAL / URINARY TRACT ULTRASOUND COMPLETE COMPARISON:  Abdominal ultrasound of November 14, 2010 and abdominal and pelvic CT scan of September 03, 2007 FINDINGS: Right Kidney: Length: 4.6 cm. Diffuse cortical thinning. No hydronephrosis observed. The renal cortical echotexture is increase with respect to the liver. These findings are similar to those seen on the CT scan of September 03, 2007. The right kidney was not visualized on the abdominal ultrasound of November 14, 2010. Left Kidney: Length: 8.7 cm which has decreased since the March 2012 ultrasound at which time the kidney measured 10.56 cm. The renal cortical echotexture is equal to or lower than that of the adjacent liver. No hydronephrosis Bladder: Normal left ureteral jet. No right ureteral jet observed over 3 minutes. The partially distended urinary bladder is normal. IMPRESSION: 1. Chronic atrophy and likely nonfunctional E or minimally functioning right kidney. 2. Atrophy of the left kidney since the 2012 ultrasound. No evidence of obstruction. 3. Normal appearance of the urinary bladder. 4. Incidental note is made of gallstones which have been previously demonstrated. A  complex septated appearance of the gallbladder is observed. A dedicated limited gallbladder ultrasound is recommended. Electronically Signed   By: David  [place] M.D.   On: 03/26/2016 10:58   EKG:   Orders placed or performed during the hospital encounter of 11/20/15  . ED EKG  . ED EKG  . EKG 12-Lead  . EKG 12-Lead      Management plans discussed with  the patient, family and they are in agreement.  CODE STATUS:     Code Status Orders        Start     Ordered   03/24/16 2008  Do not attempt resuscitation (DNR)  Continuous    Question Answer Comment  In the event of cardiac or respiratory ARREST Do not call a "code blue"   In the event of cardiac or respiratory ARREST Do not perform Intubation, CPR, defibrillation or ACLS   In the event of cardiac or respiratory ARREST Use medication by any route, position, wound care, and other measures to relive pain and suffering. May use oxygen, suction and manual treatment of airway obstruction as needed for comfort.      03/24/16 2008    Code Status History    Date Active Date Inactive Code Status Order ID Comments User Context   11/21/2015 11:30 AM 11/22/2015  6:31 PM DNR [id number]  Enedina Finner, MD Inpatient   11/20/2015  1:12 PM 11/21/2015 11:30 AM Full Code [id number]  Katharina Caper, MD ED   11/09/2015  4:26 PM 11/10/2015  5:19 PM DNR [id number]  Houston Siren, MD Inpatient    Advance Directive Documentation   Flowsheet Row Most Recent Value  Type of Advance Directive  Healthcare Power of Attorney, Out of facility DNR (pink MOST or yellow form)  Pre-existing out of facility DNR order (yellow form or pink MOST form)  No data  "MOST" Form in Place?  No data      TOTAL TIME TAKING CARE OF THIS PATIENT: 40 minutes.    Katharina Caper M.D on 03/26/2016 at 11:37 AM  Between 7am to 6pm - Pager - [phone]  After 6pm go to [email or web address] - password [email or web address] [facility]  [facility]  Office  [phone]  CC: Primary care  physician; Marisue Ivan, MD

## 2016-03-26 NOTE — Progress Notes (Signed)
Hydralazine dose X2 overnight for hypertension. Tolerated well; BP 159/78 this am. Windy Carina, RN7:26 AM 03/26/2016

## 2016-03-26 NOTE — Progress Notes (Signed)
Pt stable. IV removed. D/c instructions given and education provided. Prescriptions verified and electronically sent to pharmacy. Pt states she understands instructions. Pt dressed and escorted out by staff. Driven home by family.

## 2016-03-26 NOTE — Consult Note (Signed)
   [facility] CM Inpatient Consult   03/26/2016  Melissa Bright Feb 03, 1924 [id number]  Patient screened for potential [facility] Care Management services. Patient is eligible for [facility] Care [facility] Services. Electronic medical record reveals patient's discharge plan is to go home with the [facility] team. [facility] Care Management services not appropriate at this time. If patient's post hospital needs change please place a [facility] Care Management consult. For questions please contact:   Erline Siddoway RN, BSN [facility] Liaison  [phone]) [employer] [phone]) Toll free office

## 2016-03-26 NOTE — Care Management (Addendum)
Patient to be discharged home today.  PT has recommend home health PT. Followed up with daughter and patient today.  Agreeable to home health services.  Referral has been made to [place] with [facility].  RNCM signing off

## 2016-03-31 DIAGNOSIS — F0391 Unspecified dementia with behavioral disturbance: Secondary | ICD-10-CM | POA: Diagnosis not present

## 2016-03-31 DIAGNOSIS — D62 Acute posthemorrhagic anemia: Secondary | ICD-10-CM | POA: Diagnosis not present

## 2016-03-31 DIAGNOSIS — Z87440 Personal history of urinary (tract) infections: Secondary | ICD-10-CM | POA: Diagnosis not present

## 2016-03-31 DIAGNOSIS — K922 Gastrointestinal hemorrhage, unspecified: Secondary | ICD-10-CM | POA: Diagnosis not present

## 2016-03-31 DIAGNOSIS — I509 Heart failure, unspecified: Secondary | ICD-10-CM | POA: Diagnosis not present

## 2016-03-31 DIAGNOSIS — N184 Chronic kidney disease, stage 4 (severe): Secondary | ICD-10-CM | POA: Diagnosis not present

## 2016-03-31 DIAGNOSIS — I129 Hypertensive chronic kidney disease with stage 1 through stage 4 chronic kidney disease, or unspecified chronic kidney disease: Secondary | ICD-10-CM | POA: Diagnosis not present

## 2016-04-02 DIAGNOSIS — D62 Acute posthemorrhagic anemia: Secondary | ICD-10-CM | POA: Diagnosis not present

## 2016-04-02 DIAGNOSIS — N184 Chronic kidney disease, stage 4 (severe): Secondary | ICD-10-CM | POA: Diagnosis not present

## 2016-04-02 DIAGNOSIS — I509 Heart failure, unspecified: Secondary | ICD-10-CM | POA: Diagnosis not present

## 2016-04-02 DIAGNOSIS — K922 Gastrointestinal hemorrhage, unspecified: Secondary | ICD-10-CM | POA: Diagnosis not present

## 2016-04-02 DIAGNOSIS — Z87440 Personal history of urinary (tract) infections: Secondary | ICD-10-CM | POA: Diagnosis not present

## 2016-04-02 DIAGNOSIS — F0391 Unspecified dementia with behavioral disturbance: Secondary | ICD-10-CM | POA: Diagnosis not present

## 2016-04-02 DIAGNOSIS — I129 Hypertensive chronic kidney disease with stage 1 through stage 4 chronic kidney disease, or unspecified chronic kidney disease: Secondary | ICD-10-CM | POA: Diagnosis not present

## 2016-04-03 DIAGNOSIS — Z87440 Personal history of urinary (tract) infections: Secondary | ICD-10-CM | POA: Diagnosis not present

## 2016-04-03 DIAGNOSIS — K922 Gastrointestinal hemorrhage, unspecified: Secondary | ICD-10-CM | POA: Diagnosis not present

## 2016-04-03 DIAGNOSIS — D62 Acute posthemorrhagic anemia: Secondary | ICD-10-CM | POA: Diagnosis not present

## 2016-04-03 DIAGNOSIS — I129 Hypertensive chronic kidney disease with stage 1 through stage 4 chronic kidney disease, or unspecified chronic kidney disease: Secondary | ICD-10-CM | POA: Diagnosis not present

## 2016-04-03 DIAGNOSIS — N184 Chronic kidney disease, stage 4 (severe): Secondary | ICD-10-CM | POA: Diagnosis not present

## 2016-04-03 DIAGNOSIS — I509 Heart failure, unspecified: Secondary | ICD-10-CM | POA: Diagnosis not present

## 2016-04-03 DIAGNOSIS — F0391 Unspecified dementia with behavioral disturbance: Secondary | ICD-10-CM | POA: Diagnosis not present

## 2016-04-07 DIAGNOSIS — Z87440 Personal history of urinary (tract) infections: Secondary | ICD-10-CM | POA: Diagnosis not present

## 2016-04-07 DIAGNOSIS — I509 Heart failure, unspecified: Secondary | ICD-10-CM | POA: Diagnosis not present

## 2016-04-07 DIAGNOSIS — D62 Acute posthemorrhagic anemia: Secondary | ICD-10-CM | POA: Diagnosis not present

## 2016-04-07 DIAGNOSIS — N184 Chronic kidney disease, stage 4 (severe): Secondary | ICD-10-CM | POA: Diagnosis not present

## 2016-04-07 DIAGNOSIS — I129 Hypertensive chronic kidney disease with stage 1 through stage 4 chronic kidney disease, or unspecified chronic kidney disease: Secondary | ICD-10-CM | POA: Diagnosis not present

## 2016-04-07 DIAGNOSIS — F0391 Unspecified dementia with behavioral disturbance: Secondary | ICD-10-CM | POA: Diagnosis not present

## 2016-04-07 DIAGNOSIS — K922 Gastrointestinal hemorrhage, unspecified: Secondary | ICD-10-CM | POA: Diagnosis not present

## 2016-04-08 DIAGNOSIS — K922 Gastrointestinal hemorrhage, unspecified: Secondary | ICD-10-CM | POA: Diagnosis not present

## 2016-04-08 DIAGNOSIS — I509 Heart failure, unspecified: Secondary | ICD-10-CM | POA: Diagnosis not present

## 2016-04-08 DIAGNOSIS — D62 Acute posthemorrhagic anemia: Secondary | ICD-10-CM | POA: Diagnosis not present

## 2016-04-08 DIAGNOSIS — Z87440 Personal history of urinary (tract) infections: Secondary | ICD-10-CM | POA: Diagnosis not present

## 2016-04-08 DIAGNOSIS — I129 Hypertensive chronic kidney disease with stage 1 through stage 4 chronic kidney disease, or unspecified chronic kidney disease: Secondary | ICD-10-CM | POA: Diagnosis not present

## 2016-04-08 DIAGNOSIS — N184 Chronic kidney disease, stage 4 (severe): Secondary | ICD-10-CM | POA: Diagnosis not present

## 2016-04-08 DIAGNOSIS — F0391 Unspecified dementia with behavioral disturbance: Secondary | ICD-10-CM | POA: Diagnosis not present

## 2016-04-10 DIAGNOSIS — Z87440 Personal history of urinary (tract) infections: Secondary | ICD-10-CM | POA: Diagnosis not present

## 2016-04-10 DIAGNOSIS — I509 Heart failure, unspecified: Secondary | ICD-10-CM | POA: Diagnosis not present

## 2016-04-10 DIAGNOSIS — N184 Chronic kidney disease, stage 4 (severe): Secondary | ICD-10-CM | POA: Diagnosis not present

## 2016-04-10 DIAGNOSIS — D62 Acute posthemorrhagic anemia: Secondary | ICD-10-CM | POA: Diagnosis not present

## 2016-04-10 DIAGNOSIS — K922 Gastrointestinal hemorrhage, unspecified: Secondary | ICD-10-CM | POA: Diagnosis not present

## 2016-04-10 DIAGNOSIS — I129 Hypertensive chronic kidney disease with stage 1 through stage 4 chronic kidney disease, or unspecified chronic kidney disease: Secondary | ICD-10-CM | POA: Diagnosis not present

## 2016-04-10 DIAGNOSIS — F0391 Unspecified dementia with behavioral disturbance: Secondary | ICD-10-CM | POA: Diagnosis not present

## 2016-04-15 DIAGNOSIS — D62 Acute posthemorrhagic anemia: Secondary | ICD-10-CM | POA: Diagnosis not present

## 2016-04-15 DIAGNOSIS — I509 Heart failure, unspecified: Secondary | ICD-10-CM | POA: Diagnosis not present

## 2016-04-15 DIAGNOSIS — N184 Chronic kidney disease, stage 4 (severe): Secondary | ICD-10-CM | POA: Diagnosis not present

## 2016-04-15 DIAGNOSIS — F0391 Unspecified dementia with behavioral disturbance: Secondary | ICD-10-CM | POA: Diagnosis not present

## 2016-04-15 DIAGNOSIS — K922 Gastrointestinal hemorrhage, unspecified: Secondary | ICD-10-CM | POA: Diagnosis not present

## 2016-04-15 DIAGNOSIS — Z87440 Personal history of urinary (tract) infections: Secondary | ICD-10-CM | POA: Diagnosis not present

## 2016-04-15 DIAGNOSIS — I129 Hypertensive chronic kidney disease with stage 1 through stage 4 chronic kidney disease, or unspecified chronic kidney disease: Secondary | ICD-10-CM | POA: Diagnosis not present

## 2016-04-29 DIAGNOSIS — K922 Gastrointestinal hemorrhage, unspecified: Secondary | ICD-10-CM | POA: Diagnosis not present

## 2016-04-29 DIAGNOSIS — F0391 Unspecified dementia with behavioral disturbance: Secondary | ICD-10-CM | POA: Diagnosis not present

## 2016-04-29 DIAGNOSIS — Z87440 Personal history of urinary (tract) infections: Secondary | ICD-10-CM | POA: Diagnosis not present

## 2016-04-29 DIAGNOSIS — N184 Chronic kidney disease, stage 4 (severe): Secondary | ICD-10-CM | POA: Diagnosis not present

## 2016-04-29 DIAGNOSIS — D62 Acute posthemorrhagic anemia: Secondary | ICD-10-CM | POA: Diagnosis not present

## 2016-04-29 DIAGNOSIS — I129 Hypertensive chronic kidney disease with stage 1 through stage 4 chronic kidney disease, or unspecified chronic kidney disease: Secondary | ICD-10-CM | POA: Diagnosis not present

## 2016-04-29 DIAGNOSIS — I509 Heart failure, unspecified: Secondary | ICD-10-CM | POA: Diagnosis not present

## 2016-07-07 DIAGNOSIS — R300 Dysuria: Secondary | ICD-10-CM | POA: Diagnosis not present

## 2016-07-12 ENCOUNTER — Inpatient Hospital Stay
Admission: EM | Admit: 2016-07-12 | Discharge: 2016-07-17 | DRG: 378 | Disposition: A | Discharge status: Home Health | Payer: Commercial Managed Care - HMO | Attending: Internal Medicine | Admitting: Internal Medicine

## 2016-07-12 ENCOUNTER — Emergency Department: Payer: Commercial Managed Care - HMO

## 2016-07-12 ENCOUNTER — Encounter: Payer: Self-pay | Admitting: Emergency Medicine

## 2016-07-12 DIAGNOSIS — N179 Acute kidney failure, unspecified: Secondary | ICD-10-CM | POA: Diagnosis present

## 2016-07-12 DIAGNOSIS — Z993 Dependence on wheelchair: Secondary | ICD-10-CM

## 2016-07-12 DIAGNOSIS — J449 Chronic obstructive pulmonary disease, unspecified: Secondary | ICD-10-CM | POA: Diagnosis present

## 2016-07-12 DIAGNOSIS — M549 Dorsalgia, unspecified: Secondary | ICD-10-CM | POA: Diagnosis present

## 2016-07-12 DIAGNOSIS — Z79899 Other long term (current) drug therapy: Secondary | ICD-10-CM | POA: Diagnosis not present

## 2016-07-12 DIAGNOSIS — Z87440 Personal history of urinary (tract) infections: Secondary | ICD-10-CM | POA: Diagnosis not present

## 2016-07-12 DIAGNOSIS — D62 Acute posthemorrhagic anemia: Secondary | ICD-10-CM | POA: Diagnosis not present

## 2016-07-12 DIAGNOSIS — D539 Nutritional anemia, unspecified: Secondary | ICD-10-CM | POA: Diagnosis present

## 2016-07-12 DIAGNOSIS — K922 Gastrointestinal hemorrhage, unspecified: Secondary | ICD-10-CM | POA: Diagnosis not present

## 2016-07-12 DIAGNOSIS — Z90710 Acquired absence of both cervix and uterus: Secondary | ICD-10-CM | POA: Diagnosis not present

## 2016-07-12 DIAGNOSIS — Z87891 Personal history of nicotine dependence: Secondary | ICD-10-CM | POA: Diagnosis not present

## 2016-07-12 DIAGNOSIS — I5032 Chronic diastolic (congestive) heart failure: Secondary | ICD-10-CM | POA: Diagnosis present

## 2016-07-12 DIAGNOSIS — Z8673 Personal history of transient ischemic attack (TIA), and cerebral infarction without residual deficits: Secondary | ICD-10-CM | POA: Diagnosis not present

## 2016-07-12 DIAGNOSIS — I130 Hypertensive heart and chronic kidney disease with heart failure and stage 1 through stage 4 chronic kidney disease, or unspecified chronic kidney disease: Secondary | ICD-10-CM | POA: Diagnosis not present

## 2016-07-12 DIAGNOSIS — F419 Anxiety disorder, unspecified: Secondary | ICD-10-CM | POA: Diagnosis present

## 2016-07-12 DIAGNOSIS — N390 Urinary tract infection, site not specified: Secondary | ICD-10-CM | POA: Diagnosis not present

## 2016-07-12 DIAGNOSIS — I2510 Atherosclerotic heart disease of native coronary artery without angina pectoris: Secondary | ICD-10-CM | POA: Diagnosis present

## 2016-07-12 DIAGNOSIS — I10 Essential (primary) hypertension: Secondary | ICD-10-CM | POA: Diagnosis not present

## 2016-07-12 DIAGNOSIS — K921 Melena: Secondary | ICD-10-CM

## 2016-07-12 DIAGNOSIS — N3000 Acute cystitis without hematuria: Secondary | ICD-10-CM | POA: Diagnosis not present

## 2016-07-12 DIAGNOSIS — Z66 Do not resuscitate: Secondary | ICD-10-CM | POA: Diagnosis present

## 2016-07-12 DIAGNOSIS — N184 Chronic kidney disease, stage 4 (severe): Secondary | ICD-10-CM | POA: Diagnosis present

## 2016-07-12 DIAGNOSIS — K5900 Constipation, unspecified: Secondary | ICD-10-CM | POA: Diagnosis not present

## 2016-07-12 DIAGNOSIS — Z23 Encounter for immunization: Secondary | ICD-10-CM | POA: Diagnosis not present

## 2016-07-12 DIAGNOSIS — E860 Dehydration: Secondary | ICD-10-CM | POA: Diagnosis present

## 2016-07-12 DIAGNOSIS — Z634 Disappearance and death of family member: Secondary | ICD-10-CM

## 2016-07-12 DIAGNOSIS — R0789 Other chest pain: Secondary | ICD-10-CM | POA: Diagnosis not present

## 2016-07-12 DIAGNOSIS — Z8249 Family history of ischemic heart disease and other diseases of the circulatory system: Secondary | ICD-10-CM

## 2016-07-12 DIAGNOSIS — M6281 Muscle weakness (generalized): Secondary | ICD-10-CM

## 2016-07-12 DIAGNOSIS — R079 Chest pain, unspecified: Secondary | ICD-10-CM | POA: Diagnosis not present

## 2016-07-12 LAB — CBC
HEMATOCRIT: 25.2 % — AB (ref 35.0–47.0)
HEMOGLOBIN: 8 g/dL — AB (ref 12.0–16.0)
MCH: 29.9 pg (ref 26.0–34.0)
MCHC: 31.8 g/dL — AB (ref 32.0–36.0)
MCV: 93.8 fL (ref 80.0–100.0)
Platelets: 249 10*3/uL (ref 150–440)
RBC: 2.69 MIL/uL — ABNORMAL LOW (ref 3.80–5.20)
RDW: 16.4 % — ABNORMAL HIGH (ref 11.5–14.5)
WBC: 6 10*3/uL (ref 3.6–11.0)

## 2016-07-12 LAB — HEMOGLOBIN
HEMOGLOBIN: 9.4 g/dL — AB (ref 12.0–16.0)
HEMOGLOBIN: 9.7 g/dL — AB (ref 12.0–16.0)

## 2016-07-12 LAB — URINALYSIS COMPLETE WITH MICROSCOPIC (ARMC ONLY)
Bilirubin Urine: NEGATIVE
Glucose, UA: NEGATIVE mg/dL
KETONES UR: NEGATIVE mg/dL
NITRITE: NEGATIVE
PH: 6 (ref 5.0–8.0)
PROTEIN: 100 mg/dL — AB
SPECIFIC GRAVITY, URINE: 1.012 (ref 1.005–1.030)

## 2016-07-12 LAB — HEPATIC FUNCTION PANEL
ALT: 10 U/L — ABNORMAL LOW (ref 14–54)
AST: 27 U/L (ref 15–41)
Albumin: 2.7 g/dL — ABNORMAL LOW (ref 3.5–5.0)
Alkaline Phosphatase: 63 U/L (ref 38–126)
BILIRUBIN DIRECT: 0.1 mg/dL (ref 0.1–0.5)
BILIRUBIN INDIRECT: 0.3 mg/dL (ref 0.3–0.9)
TOTAL PROTEIN: 6.5 g/dL (ref 6.5–8.1)
Total Bilirubin: 0.4 mg/dL (ref 0.3–1.2)

## 2016-07-12 LAB — BASIC METABOLIC PANEL
ANION GAP: 7 (ref 5–15)
BUN: 44 mg/dL — AB (ref 6–20)
CHLORIDE: 105 mmol/L (ref 101–111)
CO2: 25 mmol/L (ref 22–32)
Calcium: 9.1 mg/dL (ref 8.9–10.3)
Creatinine, Ser: 3.33 mg/dL — ABNORMAL HIGH (ref 0.44–1.00)
GFR calc Af Amer: 13 mL/min — ABNORMAL LOW (ref >60)
GFR, EST NON AFRICAN AMERICAN: 11 mL/min — AB (ref >60)
GLUCOSE: 121 mg/dL — AB (ref 65–99)
POTASSIUM: 4.7 mmol/L (ref 3.5–5.1)
Sodium: 137 mmol/L (ref 135–145)

## 2016-07-12 LAB — BRAIN NATRIURETIC PEPTIDE: B Natriuretic Peptide: 282 pg/mL — ABNORMAL HIGH (ref 0.0–100.0)

## 2016-07-12 LAB — PREPARE RBC (CROSSMATCH)

## 2016-07-12 LAB — TROPONIN I: Troponin I: <0.03 ng/mL (ref <0.03)

## 2016-07-12 MED ORDER — SODIUM CHLORIDE 0.9% FLUSH
3.0000 mL | Freq: Two times a day (BID) | INTRAVENOUS | Status: DC
  Administered 2016-07-12 – 2016-07-17 (×9): 3 mL via INTRAVENOUS

## 2016-07-12 MED ORDER — OMEGA-3-ACID ETHYL ESTERS 1 G PO CAPS
1.0000 g | ORAL_CAPSULE | Freq: Every day | ORAL | Status: DC
  Administered 2016-07-13 – 2016-07-17 (×5): 1 g via ORAL
  Filled 2016-07-12 (×5): qty 1

## 2016-07-12 MED ORDER — LATANOPROST 0.005 % OP SOLN
1.0000 [drp] | Freq: Every day | OPHTHALMIC | Status: DC
  Administered 2016-07-12 – 2016-07-16 (×5): 1 [drp] via OPHTHALMIC
  Filled 2016-07-12 (×2): qty 2.5

## 2016-07-12 MED ORDER — DEXTROSE 5 % IV SOLN
1.0000 g | Freq: Every day | INTRAVENOUS | Status: DC
  Filled 2016-07-12: qty 10

## 2016-07-12 MED ORDER — LEVOTHYROXINE SODIUM 112 MCG PO TABS
112.0000 ug | ORAL_TABLET | Freq: Every day | ORAL | Status: DC
  Administered 2016-07-13 – 2016-07-17 (×5): 112 ug via ORAL
  Filled 2016-07-12 (×5): qty 1

## 2016-07-12 MED ORDER — PANTOPRAZOLE SODIUM 40 MG PO TBEC
40.0000 mg | DELAYED_RELEASE_TABLET | Freq: Two times a day (BID) | ORAL | Status: DC
  Administered 2016-07-12 – 2016-07-17 (×10): 40 mg via ORAL
  Filled 2016-07-12 (×10): qty 1

## 2016-07-12 MED ORDER — ATORVASTATIN CALCIUM 20 MG PO TABS
40.0000 mg | ORAL_TABLET | Freq: Every day | ORAL | Status: DC
  Administered 2016-07-13 – 2016-07-17 (×5): 40 mg via ORAL
  Filled 2016-07-12 (×5): qty 2

## 2016-07-12 MED ORDER — CEFTRIAXONE SODIUM-DEXTROSE 1-3.74 GM-% IV SOLR
1.0000 g | Freq: Once | INTRAVENOUS | Status: AC
  Administered 2016-07-12: 1 g via INTRAVENOUS
  Filled 2016-07-12: qty 50

## 2016-07-12 MED ORDER — VITAMIN B-12 1000 MCG PO TABS
1000.0000 ug | ORAL_TABLET | Freq: Every day | ORAL | Status: DC
  Administered 2016-07-13 – 2016-07-17 (×5): 1000 ug via ORAL
  Filled 2016-07-12 (×5): qty 1

## 2016-07-12 MED ORDER — DOCUSATE SODIUM 100 MG PO CAPS
100.0000 mg | ORAL_CAPSULE | Freq: Two times a day (BID) | ORAL | Status: DC
  Administered 2016-07-12 – 2016-07-17 (×10): 100 mg via ORAL
  Filled 2016-07-12 (×10): qty 1

## 2016-07-12 MED ORDER — ACETAMINOPHEN 325 MG PO TABS
650.0000 mg | ORAL_TABLET | Freq: Four times a day (QID) | ORAL | Status: DC | PRN
  Administered 2016-07-14 – 2016-07-17 (×3): 650 mg via ORAL
  Filled 2016-07-12 (×3): qty 2

## 2016-07-12 MED ORDER — FERROUS GLUCONATE 240 (27 FE) MG PO TABS
240.0000 mg | ORAL_TABLET | Freq: Every day | ORAL | Status: DC
  Administered 2016-07-13 – 2016-07-17 (×5): 240 mg via ORAL
  Filled 2016-07-12 (×5): qty 1

## 2016-07-12 MED ORDER — CARVEDILOL 25 MG PO TABS
25.0000 mg | ORAL_TABLET | Freq: Two times a day (BID) | ORAL | Status: DC
  Administered 2016-07-12 – 2016-07-17 (×9): 25 mg via ORAL
  Filled 2016-07-12 (×10): qty 1

## 2016-07-12 MED ORDER — INFLUENZA VAC SPLIT QUAD 0.5 ML IM SUSY
0.5000 mL | PREFILLED_SYRINGE | INTRAMUSCULAR | Status: AC
  Administered 2016-07-13: 16:00:00 0.5 mL via INTRAMUSCULAR
  Filled 2016-07-12: qty 0.5

## 2016-07-12 MED ORDER — CEFTRIAXONE SODIUM-DEXTROSE 1-3.74 GM-% IV SOLR
1.0000 g | INTRAVENOUS | Status: AC
  Administered 2016-07-13 – 2016-07-14 (×2): 1 g via INTRAVENOUS
  Filled 2016-07-12 (×2): qty 50

## 2016-07-12 MED ORDER — HYDRALAZINE HCL 50 MG PO TABS
50.0000 mg | ORAL_TABLET | Freq: Three times a day (TID) | ORAL | Status: DC
  Administered 2016-07-12 – 2016-07-17 (×13): 50 mg via ORAL
  Filled 2016-07-12 (×14): qty 1

## 2016-07-12 MED ORDER — DEXTROSE 5 % IV SOLN: 1.0000 g | Freq: Once | INTRAVENOUS | Status: DC

## 2016-07-12 MED ORDER — SODIUM BICARBONATE 650 MG PO TABS
650.0000 mg | ORAL_TABLET | Freq: Two times a day (BID) | ORAL | Status: DC
  Administered 2016-07-12 – 2016-07-17 (×10): 650 mg via ORAL
  Filled 2016-07-12 (×10): qty 1

## 2016-07-12 MED ORDER — FOLIC ACID 800 MCG PO TABS: 800.0000 ug | ORAL_TABLET | Freq: Every day | ORAL | Status: DC

## 2016-07-12 MED ORDER — AMLODIPINE BESYLATE 5 MG PO TABS
5.0000 mg | ORAL_TABLET | Freq: Every day | ORAL | Status: DC
  Administered 2016-07-13 – 2016-07-15 (×3): 5 mg via ORAL
  Filled 2016-07-12 (×3): qty 1

## 2016-07-12 MED ORDER — SENNOSIDES-DOCUSATE SODIUM 8.6-50 MG PO TABS
1.0000 | ORAL_TABLET | Freq: Every evening | ORAL | Status: DC | PRN
  Administered 2016-07-15 – 2016-07-17 (×2): 1 via ORAL
  Filled 2016-07-12 (×2): qty 1

## 2016-07-12 MED ORDER — SODIUM CHLORIDE 0.9% FLUSH: 3.0000 mL | INTRAVENOUS | Status: DC | PRN

## 2016-07-12 MED ORDER — ISOSORBIDE MONONITRATE ER 30 MG PO TB24
30.0000 mg | ORAL_TABLET | Freq: Every day | ORAL | Status: DC
  Administered 2016-07-13: 30 mg via ORAL
  Filled 2016-07-12: qty 1

## 2016-07-12 MED ORDER — ACETAMINOPHEN 650 MG RE SUPP: 650.0000 mg | Freq: Four times a day (QID) | RECTAL | Status: DC | PRN

## 2016-07-12 MED ORDER — SODIUM CHLORIDE 0.9 % IV SOLN
INTRAVENOUS | Status: DC
  Administered 2016-07-12 – 2016-07-14 (×3): via INTRAVENOUS

## 2016-07-12 MED ORDER — SODIUM CHLORIDE 0.9 % IV SOLN
250.0000 mL | INTRAVENOUS | Status: DC | PRN
  Administered 2016-07-12: 250 mL via INTRAVENOUS

## 2016-07-12 MED ORDER — FOLIC ACID 1 MG PO TABS
1.0000 mg | ORAL_TABLET | Freq: Every day | ORAL | Status: DC
  Administered 2016-07-13 – 2016-07-17 (×5): 1 mg via ORAL
  Filled 2016-07-12 (×5): qty 1

## 2016-07-12 NOTE — H&P (Signed)
Melissa Bright is an 82 y.o. female.   Chief Complaint: Not feeling good. HPI: This is a 82 year old female who for the past 3 days states that she's been feeling bad all over. She was prescribed Septra by her primary care physician for urinary tract infection as taking all her medications. During that time she's had some nausea and vomiting which her daughter thinks is from the medication. Today she started having some pain in her abdomen and chest. In the hospital she's found to be anemic and required posture. She denies taking any NSAIDs or aspirin.  Past Medical History:  Diagnosis Date  . Arthritis   . Blood transfusion without reported diagnosis   . CHF (congestive heart failure) ([place])   . Coronary artery disease   . Hypertension   . Renal disorder   . Stroke [facility])     Past Surgical History:  Procedure Laterality Date  . ABDOMINAL HYSTERECTOMY    . CARDIAC SURGERY    . EYE SURGERY    . JOINT REPLACEMENT      Family History  Problem Relation Age of Onset  . Heart attack Father    Social History:  reports that she has quit smoking. She has never used smokeless tobacco. She reports that she does not drink alcohol or use drugs.  Allergies:  Allergies  Allergen Reactions  . Macrobid [Nitrofurantoin Monohyd Macro]      (Not in a hospital admission)  Results for orders placed or performed during the hospital encounter of 07/12/16 (from the past 48 hour(s))  Basic metabolic panel     Status: Abnormal   Collection Time: 07/12/16 11:18 AM  Result Value Ref Range   Sodium 137 135 - 145 mmol/L   Potassium 4.7 3.5 - 5.1 mmol/L    Comment: HEMOLYSIS AT THIS LEVEL MAY AFFECT RESULT   Chloride 105 101 - 111 mmol/L   CO2 25 22 - 32 mmol/L   Glucose, Bld 121 (H) 65 - 99 mg/dL   BUN 44 (H) 6 - 20 mg/dL   Creatinine, Ser 3.33 (H) 0.44 - 1.00 mg/dL   Calcium 9.1 8.9 - 10.3 mg/dL   GFR calc non Af Amer 11 (L) >60 mL/min   GFR calc Af Amer 13 (L) >60 mL/min    Comment: (NOTE) The  eGFR has been calculated using the CKD EPI equation. This calculation has not been validated in all clinical situations. eGFR's persistently <60 mL/min signify possible Chronic Kidney Disease.    Anion gap 7 5 - 15  CBC     Status: Abnormal   Collection Time: 07/12/16 11:18 AM  Result Value Ref Range   WBC 6.0 3.6 - 11.0 K/uL   RBC 2.69 (L) 3.80 - 5.20 MIL/uL   Hemoglobin 8.0 (L) 12.0 - 16.0 g/dL   HCT 25.2 (L) 35.0 - 47.0 %   MCV 93.8 80.0 - 100.0 fL   MCH 29.9 26.0 - 34.0 pg   MCHC 31.8 (L) 32.0 - 36.0 g/dL   RDW 16.4 (H) 11.5 - 14.5 %   Platelets 249 150 - 440 K/uL  Troponin I     Status: None   Collection Time: 07/12/16 11:18 AM  Result Value Ref Range   Troponin I <0.03 <0.03 ng/mL  Hepatic function panel     Status: Abnormal   Collection Time: 07/12/16 11:18 AM  Result Value Ref Range   Total Protein 6.5 6.5 - 8.1 g/dL   Albumin 2.7 (L) 3.5 - 5.0 g/dL  AST 27 15 - 41 U/L   ALT 10 (L) 14 - 54 U/L   Alkaline Phosphatase 63 38 - 126 U/L   Total Bilirubin 0.4 0.3 - 1.2 mg/dL   Bilirubin, Direct 0.1 0.1 - 0.5 mg/dL   Indirect Bilirubin 0.3 0.3 - 0.9 mg/dL  Brain natriuretic peptide     Status: Abnormal   Collection Time: 07/12/16 11:18 AM  Result Value Ref Range   B Natriuretic Peptide 282.0 (H) 0.0 - 100.0 pg/mL  Urinalysis complete, with microscopic ([facility] only)     Status: Abnormal   Collection Time: 07/12/16 12:02 PM  Result Value Ref Range   Color, Urine YELLOW (A) YELLOW   APPearance HAZY (A) CLEAR   Glucose, UA NEGATIVE NEGATIVE mg/dL   Bilirubin Urine NEGATIVE NEGATIVE   Ketones, ur NEGATIVE NEGATIVE mg/dL   Specific Gravity, Urine 1.012 1.005 - 1.030   Hgb urine dipstick 1+ (A) NEGATIVE   pH 6.0 5.0 - 8.0   Protein, ur 100 (A) NEGATIVE mg/dL   Nitrite NEGATIVE NEGATIVE   Leukocytes, UA 3+ (A) NEGATIVE   RBC / HPF 0-5 0 - 5 RBC/hpf   WBC, UA TOO NUMEROUS TO COUNT 0 - 5 WBC/hpf   Bacteria, UA RARE (A) NONE SEEN   Squamous Epithelial / LPF 0-5 (A) NONE  SEEN   WBC Clumps PRESENT   Type and screen     Status: None (Preliminary result)   Collection Time: 07/12/16 12:25 PM  Result Value Ref Range   ABO/RH(D) B NEG    Antibody Screen PENDING    Sample Expiration 07/15/2016   Prepare RBC (crossmatch)     Status: None (Preliminary result)   Collection Time: 07/12/16 12:25 PM  Result Value Ref Range   Order Confirmation PENDING    Dg Chest Portable 1 View  Result Date: 07/12/2016 CLINICAL DATA:  Chest pressure and discomfort EXAM: PORTABLE CHEST 1 VIEW COMPARISON:  11/20/2015 FINDINGS: Aortic atherosclerosis noted. The heart size and mediastinal contours are within normal limits. Both lungs are clear. The visualized skeletal structures are unremarkable. IMPRESSION: No active disease. Electronically Signed   By: Kerby Moors M.D.   On: 07/12/2016 11:47    Review of Systems  Constitutional: Negative for chills and fever.  HENT: Negative for hearing loss.   Eyes: Negative for blurred vision.  Respiratory: Negative for sputum production.   Cardiovascular: Positive for chest pain.  Gastrointestinal: Positive for nausea and vomiting.  Genitourinary: Positive for dysuria.  Musculoskeletal: Positive for joint pain.  Skin: Negative for rash.  Neurological: Negative for sensory change.    Blood pressure (!) 146/54, pulse (!) 56, temperature 98 F (36.7 C), temperature source Oral, resp. rate 16, height 5' 2"  (1.575 m), weight 70.3 kg (155 lb), SpO2 99 %. Physical Exam  Constitutional: She is oriented to person, place, and time.  Ill-appearing female in no acute distress  HENT:  Head: Normocephalic and atraumatic.  Oral mucosa dry.  Eyes: Pupils are equal, round, and reactive to light. No scleral icterus.  Neck: No JVD present. No tracheal deviation present. No thyromegaly present.  Cardiovascular:  Regular rate and rhythm. 2/6 systolic murmur.  Respiratory:  Clear to auscultation. No use of accessory muscles.  GI:  Soft. Bowel  sounds positive. Mild tenderness in left upper quadrant.  Musculoskeletal: She exhibits edema.  Lymphadenopathy:    She has no cervical adenopathy.  Neurological: She is alert and oriented to person, place, and time.  Skin: Skin is warm and dry.  Assessment/Plan 1. GI bleeding. She is guaiac-positive. She does have a history of GI bleeding and has a remote history of having a call placed for the same. She has been in hospice twice now. At this time she and family do not desire any further workup that includes colonoscopy or bleeding scans. With her having some tenderness in the left upper quadrant suspect this may be diverticular bleed. We'll place patient on PPI and monitor hemoglobin for any further signs of bleeding.  2. Acute blood loss anemia on top of iron deficiency anemia. Patient is also pulmonary iron. Her hemoglobin is drifting down now to 8 and she feels weak and also had some chest discomfort. We'll go ahead and transfuse 1 unit of packed red blood cells and recheck hemoglobin. Continue iron supplement.  3. Acute on chronic renal failure. Creatinine is above baseline. This is likely from nausea and vomiting and possibly from blood loss. Since she has a history of congestive heart failure we will just gently hydrate her with IV fluid and give her 1 unit of packed red blood cells. Follow-up creatinine in the morning.  4. Recent UTI. Repeat urinalysis still has leukocytes. We'll go ahead and give her some Rocephin and culture the urine. As with her nausea and vomiting while taking medications as an outpatient this may not been adequately treated.  5. History of CHF. Again will watch fluids closely and hydrate her very gently at this point.  6. Chest pain. This appeared to be her overall discomfort. EKG does not show acute changes and her first troponins normal. We'll repeat troponin in a few hours. Again patient to family do not want any aggressive workup.  7. CODE STATUS. Patient  is a DO NOT RESUSCITATE.Marland Kitchen  Total time spent 45 minutes  Baxter Hire, MD 07/12/2016, 1:24 PM

## 2016-07-12 NOTE — ED Provider Notes (Signed)
[facility] Emergency Department Provider Note    First MD Initiated Contact with Patient 07/12/16 1100     (approximate)  I have reviewed the triage vital signs and the nursing notes.   HISTORY  Chief Complaint Chest Pain    HPI Melissa Bright is a 82 y.o. female who presents with 3 days of generalized worsening weakness as well as intermittent chest pain associated with shortness of breath and pressure. Patient is brought in by family from home where she lives. She's had decreased oral intake and loss of appetite. No measured fevers. No reported trauma. Does have a history of GI bleeds and is reportedly more pale than previously. She has chronically dark stools but is on iron. Denies any abdominal pain. Was recently started on Bactrim for dysuria.   Past Medical History:  Diagnosis Date  . Arthritis   . Blood transfusion without reported diagnosis   . CHF (congestive heart failure) (H[facility])   . Coronary artery disease   . Hypertension   . Renal disorder   . Stroke [facility])    Family History  Problem Relation Age of Onset  . Heart attack Father    Past Surgical History:  Procedure Laterality Date  . ABDOMINAL HYSTERECTOMY    . CARDIAC SURGERY    . EYE SURGERY    . JOINT REPLACEMENT     Patient Active Problem List   Diagnosis Date Noted  . Acute posthemorrhagic anemia 03/26/2016  . GIB (gastrointestinal bleeding) 03/26/2016  . Acute on chronic renal failure (HCC) 03/26/2016  . Pyuria 03/26/2016  . Essential hypertension, malignant 03/26/2016  . Dementia with behavioral disturbance 03/26/2016  . Symptomatic anemia 03/24/2016  . Acute on chronic diastolic congestive heart failure (HCC)   . Respiratory failure with hypoxia (HCC) 11/20/2015  . COPD exacerbation (HCC) 11/20/2015  . Left lower lobe pneumonia (HCC) 11/20/2015  . Leukocytosis 11/20/2015  . Malignant essential hypertension 11/20/2015  . CKD (chronic kidney disease), stage IV (HCC)  11/20/2015  . Anemia of chronic disease 11/20/2015  . Acute respiratory failure with hypoxia (HCC) 11/20/2015  . Accelerated hypertension 11/09/2015      Prior to Admission medications   Medication Sig Start Date End Date Taking? Authorizing Provider  amLODipine (NORVASC) 5 MG tablet Take 5 mg by mouth daily.    Historical Provider, MD  atorvastatin (LIPITOR) 40 MG tablet Take 40 mg by mouth daily.    Historical Provider, MD  carvedilol (COREG) 25 MG tablet Take 1 tablet (25 mg total) by mouth 2 (two) times daily. 03/26/16   Katharina Caper, MD  docusate sodium (COLACE) 100 MG capsule Take 100 mg by mouth 2 (two) times daily.    Historical Provider, MD  ferrous gluconate (FERGON) 240 (27 FE) MG tablet Take 1 tablet (240 mg total) by mouth daily. 11/22/15   Suan Halter, MD  folic acid (FOLVITE) 800 MCG tablet Take 800 mcg by mouth daily.    Historical Provider, MD  furosemide (LASIX) 20 MG tablet Take 1 tablet (20 mg total) by mouth daily as needed (Take one daily as needed for increased shortness of breath.  Notify Hospice  if needing to take two days in a row). 11/22/15   Suan Halter, MD  hydrALAZINE (APRESOLINE) 50 MG tablet Take 1 tablet (50 mg total) by mouth every 8 (eight) hours. 03/26/16   Katharina Caper, MD  ipratropium-albuterol (DUONEB) 0.5-2.5 (3) MG/3ML SOLN Take 3 mLs by nebulization every 4 (four) hours as needed.  11/22/15   Suan HalterMargaret F Campbell, MD  isosorbide mononitrate (IMDUR) 30 MG 24 hr tablet Take 30 mg by mouth daily.    Historical Provider, MD  latanoprost (XALATAN) 0.005 % ophthalmic solution Place 1 drop into both eyes at bedtime.    Historical Provider, MD  levothyroxine (SYNTHROID, LEVOTHROID) 112 MCG tablet Take 112 mcg by mouth daily.    Historical Provider, MD  omega-3 acid ethyl esters (LOVAZA) 1 g capsule Take 1 g by mouth daily.    Historical Provider, MD  pantoprazole (PROTONIX) 40 MG tablet Take 1 tablet (40 mg total) by mouth 2 (two) times daily.  03/26/16   Katharina Caperima [place], MD  senna-docusate (SENOKOT-S) 8.6-50 MG tablet Take 1 tablet by mouth at bedtime as needed for mild constipation.    Historical Provider, MD  sodium bicarbonate 650 MG tablet Take 650 mg by mouth 2 (two) times daily.    Historical Provider, MD  vitamin B-12 (CYANOCOBALAMIN) 1000 MCG tablet Take 1,000 mcg by mouth daily.    Historical Provider, MD    Allergies Macrobid [nitrofurantoin monohyd macro]    Social History Social History  Substance Use Topics  . Smoking status: Former Games developermoker  . Smokeless tobacco: Never Used  . Alcohol use No    Review of Systems Patient denies headaches, rhinorrhea, blurry vision, numbness, shortness of breath, chest pain, edema, cough, abdominal pain, nausea, vomiting, diarrhea, dysuria, fevers, rashes or hallucinations unless otherwise stated above in HPI. ____________________________________________   PHYSICAL EXAM:  VITAL SIGNS: Vitals:   07/12/16 1104  BP: (!) 146/54  Pulse: (!) 56  Resp: 16  Temp: 98 F (36.7 C)    Constitutional:Elderly, frail female in no acute distress. Eyes: Conjunctivae are . PERRL. EOMI. Head: Atraumatic. Nose: No congestion/rhinnorhea. Mouth/Throat: Mucous membranes are moist.  Oropharynx non-erythematous. Neck: No stridor. Painless ROM. No cervical spine tenderness to palpation Hematological/Lymphatic/Immunilogical: No cervical lymphadenopathy. Cardiovascular: Normal rate, regular rhythm. Soft holosystolic murmur.  Good peripheral circulation. Respiratory: Normal respiratory effort.  No retractions. Lungs with coarse bibasilar breath sounds respiratory crackles. Gastrointestinal: Soft and nontender. No distention. No abdominal bruits. No CVA tenderness. Genitourinary:  Musculoskeletal: No lower extremity tenderness.  2+ bilateral edema.  No joint effusions. Neurologic:  Normal speech and language. No gross focal neurologic deficits are appreciated. Skin:  Skin is warm, dry and intact.  No rash noted.  ____________________________________________   LABS (all labs ordered are listed, but only abnormal results are displayed)  Results for orders placed or performed during the hospital encounter of 07/12/16 (from the past 24 hour(s))  Basic metabolic panel     Status: Abnormal   Collection Time: 07/12/16 11:18 AM  Result Value Ref Range   Sodium 137 135 - 145 mmol/L   Potassium 4.7 3.5 - 5.1 mmol/L   Chloride 105 101 - 111 mmol/L   CO2 25 22 - 32 mmol/L   Glucose, Bld 121 (H) 65 - 99 mg/dL   BUN 44 (H) 6 - 20 mg/dL   Creatinine, Ser 1.613.33 (H) 0.44 - 1.00 mg/dL   Calcium 9.1 8.9 - 09.610.3 mg/dL   GFR calc non Af Amer 11 (L) >60 mL/min   GFR calc Af Amer 13 (L) >60 mL/min   Anion gap 7 5 - 15  CBC     Status: Abnormal   Collection Time: 07/12/16 11:18 AM  Result Value Ref Range   WBC 6.0 3.6 - 11.0 K/uL   RBC 2.69 (L) 3.80 - 5.20 MIL/uL   Hemoglobin 8.0 (L) 12.0 -  16.0 g/dL   HCT 16.1 (L) 09.6 - 04.5 %   MCV 93.8 80.0 - 100.0 fL   MCH 29.9 26.0 - 34.0 pg   MCHC 31.8 (L) 32.0 - 36.0 g/dL   RDW 40.9 (H) 81.1 - 91.4 %   Platelets 249 150 - 440 K/uL  Troponin I     Status: None   Collection Time: 07/12/16 11:18 AM  Result Value Ref Range   Troponin I <0.03 <0.03 ng/mL  Hepatic function panel     Status: Abnormal   Collection Time: 07/12/16 11:18 AM  Result Value Ref Range   Total Protein 6.5 6.5 - 8.1 g/dL   Albumin 2.7 (L) 3.5 - 5.0 g/dL   AST 27 15 - 41 U/L   ALT 10 (L) 14 - 54 U/L   Alkaline Phosphatase 63 38 - 126 U/L   Total Bilirubin 0.4 0.3 - 1.2 mg/dL   Bilirubin, Direct 0.1 0.1 - 0.5 mg/dL   Indirect Bilirubin 0.3 0.3 - 0.9 mg/dL  Brain natriuretic peptide     Status: Abnormal   Collection Time: 07/12/16 11:18 AM  Result Value Ref Range   B Natriuretic Peptide 282.0 (H) 0.0 - 100.0 pg/mL  Urinalysis complete, with microscopic ([facility] only)     Status: Abnormal   Collection Time: 07/12/16 12:02 PM  Result Value Ref Range   Color, Urine YELLOW (A)  YELLOW   APPearance HAZY (A) CLEAR   Glucose, UA NEGATIVE NEGATIVE mg/dL   Bilirubin Urine NEGATIVE NEGATIVE   Ketones, ur NEGATIVE NEGATIVE mg/dL   Specific Gravity, Urine 1.012 1.005 - 1.030   Hgb urine dipstick 1+ (A) NEGATIVE   pH 6.0 5.0 - 8.0   Protein, ur 100 (A) NEGATIVE mg/dL   Nitrite NEGATIVE NEGATIVE   Leukocytes, UA 3+ (A) NEGATIVE   RBC / HPF 0-5 0 - 5 RBC/hpf   WBC, UA TOO NUMEROUS TO COUNT 0 - 5 WBC/hpf   Bacteria, UA RARE (A) NONE SEEN   Squamous Epithelial / LPF 0-5 (A) NONE SEEN   WBC Clumps PRESENT   Type and screen     Status: None (Preliminary result)   Collection Time: 07/12/16 12:25 PM  Result Value Ref Range   ABO/RH(D) B NEG    Antibody Screen NEG    Sample Expiration 07/15/2016    Unit Number [id number]    Blood Component Type RED CELLS,LR    Unit division 00    Status of Unit ALLOCATED    Transfusion Status OK TO TRANSFUSE    Crossmatch Result Compatible   Prepare RBC (crossmatch)     Status: None   Collection Time: 07/12/16 12:25 PM  Result Value Ref Range   Order Confirmation ORDER PROCESSED BY BLOOD BANK    ____________________________________________  EKG My review and personal interpretation at Time: 10:57   Indication: chest pain  Rate: 60  Rhythm: sinus Axis: noraml Other: <57mm of dowsloping ST depressions in inferolateral leads ____________________________________________  RADIOLOGY  I personally reviewed all radiographic images ordered to evaluate for the above acute complaints and reviewed radiology reports and findings.  These findings were personally discussed with the patient.  Please see medical record for radiology report.  ____________________________________________   PROCEDURES  Procedure(s) performed: none Procedures    Critical Care performed: no ____________________________________________   INITIAL IMPRESSION / ASSESSMENT AND PLAN / ED COURSE  Pertinent labs & imaging results that were available during  my care of the patient were reviewed by me and considered in my  medical decision making (see chart for details).  DDX: gi bleed, sepsis, aki, dehydration, uti, acs, chf, pna,   Melissa Bright is a 82 y.o. who presents to the ED with Above complaints.  Patient is AFVSS in ED. Exam as above. Given current presentation have considered the above differential.  Radiographic and laboratory evaluation ordered due to concern for above differential. Patient with evidence of acute kidney injury as well as recurrent UTI and acute blood loss anemia secondary to GI bleed. Patient will be transfused with 1 unit of blood due to EKG changes associated with complaint of weakness and chest pressure suggesting acute symptomatic anemia. Patient also given IV Rocephin for evidence of recurrent UTI. Based on her symptoms patient will require admission for further evaluation and management.  Have discussed with the patient and available family all diagnostics and treatments performed thus far and all questions were answered to the best of my ability. The patient demonstrates understanding and agreement with plan.   Clinical Course      ____________________________________________   FINAL CLINICAL IMPRESSION(S) / ED DIAGNOSES  Final diagnoses:  Acute blood loss anemia  Melena  AKI (acute kidney injury) (HCC)  Acute cystitis without hematuria      NEW MEDICATIONS STARTED DURING THIS VISIT:  New Prescriptions   No medications on file     Note:  This document was prepared using Dragon voice recognition software and may include unintentional dictation errors.    Willy EddyPatrick Tyquasia Pant, MD 07/12/16 [phone]

## 2016-07-12 NOTE — Progress Notes (Signed)
Met with Pt's daughter. Pt's son is downstairs in ED receiving CPR and other aid. DR's trying to stabilize him to transport to [facility]. Mother (pt) does not know about her son. Pt has congestive heart disease and is receiving blood and antibiotics for UTI. Daughter had not informed mother of son's condition. CH is available.   07/12/16 1630  Clinical Encounter Type  Visited With Family  Visit Type Initial  Referral From Family  Spiritual Encounters  Spiritual Needs Emotional  Stress Factors  Family Stress Factors Loss

## 2016-07-12 NOTE — ED Notes (Signed)
Attempted to call report x1. Nurse unavailable 

## 2016-07-12 NOTE — ED Triage Notes (Addendum)
Pt arrived via EMS from home with c/o chest pressure and discomfort since this morning.  Pt is alert and points to the center of her chest where the pain hurts.  Pt is hard of hearing and speaks softly.  States she lives with her son.  Pt has hx of MI. Pt was formerly under hospice care, but is no longer. Daughters at bedside.

## 2016-07-12 NOTE — Progress Notes (Signed)
New admission with anemia with  hgb 8.0 with hemoccult positive check and generalized weakness. Pt lives at home with son with nausea with septra for 3 days for UTI with pt developing some chest discomfort with pt brought into ED. Dgt remains at bedside. 1 unit PRBC's transfused and tolerated well. Denies pain, nausea;no vomiting. No rectal bleeding at this time. Family member found after fall and has expired in ED with family requesting not be informed. Family calm/quiet and interacting well with pt.

## 2016-07-12 NOTE — ED Notes (Signed)
Pt recently treated for UTI with Bactrim x 3 days which she completed per daughter, pt has been weak since. Pt does not ambulate.

## 2016-07-13 LAB — BASIC METABOLIC PANEL
ANION GAP: 5 (ref 5–15)
BUN: 40 mg/dL — ABNORMAL HIGH (ref 6–20)
CALCIUM: 9 mg/dL (ref 8.9–10.3)
CO2: 26 mmol/L (ref 22–32)
CREATININE: 2.94 mg/dL — AB (ref 0.44–1.00)
Chloride: 109 mmol/L (ref 101–111)
GFR calc non Af Amer: 13 mL/min — ABNORMAL LOW (ref >60)
GFR, EST AFRICAN AMERICAN: 15 mL/min — AB (ref >60)
Glucose, Bld: 108 mg/dL — ABNORMAL HIGH (ref 65–99)
Potassium: 4.5 mmol/L (ref 3.5–5.1)
SODIUM: 140 mmol/L (ref 135–145)

## 2016-07-13 LAB — HEMOGLOBIN
HEMOGLOBIN: 10 g/dL — AB (ref 12.0–16.0)
HEMOGLOBIN: 9.1 g/dL — AB (ref 12.0–16.0)
HEMOGLOBIN: 8.2 g/dL — AB (ref 12.0–16.0)

## 2016-07-13 LAB — TYPE AND SCREEN
ABO/RH(D): B NEG
ANTIBODY SCREEN: NEGATIVE
Unit division: 0

## 2016-07-13 MED ORDER — LORAZEPAM 0.5 MG PO TABS
0.5000 mg | ORAL_TABLET | Freq: Two times a day (BID) | ORAL | Status: DC | PRN
Start: 2016-07-13 — End: 2016-07-15
  Administered 2016-07-13: 0.5 mg via ORAL
  Filled 2016-07-13: qty 1

## 2016-07-13 MED ORDER — ISOSORBIDE MONONITRATE ER 60 MG PO TB24
60.0000 mg | ORAL_TABLET | Freq: Every day | ORAL | Status: DC
  Administered 2016-07-14 – 2016-07-17 (×4): 60 mg via ORAL
  Filled 2016-07-13 (×4): qty 1

## 2016-07-13 NOTE — Progress Notes (Signed)
[employer] [facility] - [place] at [facility]   PATIENT NAME: Melissa Bright    MR#:  [id number]  DATE OF BIRTH:  08/27/1924  SUBJECTIVE:  CHIEF COMPLAINT:   Chief Complaint  Patient presents with  . Chest Pain   Tearful with her son passing away yesterday. Family at bedside. No pain or shortness of breath or abdominal pain  REVIEW OF SYSTEMS:    Review of Systems  Constitutional: Positive for malaise/fatigue. Negative for chills and fever.  HENT: Negative for sore throat.   Eyes: Negative for blurred vision, double vision and pain.  Respiratory: Negative for cough, hemoptysis, shortness of breath and wheezing.   Cardiovascular: Negative for chest pain, palpitations, orthopnea and leg swelling.  Gastrointestinal: Negative for abdominal pain, constipation, diarrhea, heartburn, nausea and vomiting.  Genitourinary: Negative for dysuria and hematuria.  Musculoskeletal: Negative for back pain and joint pain.  Skin: Negative for rash.  Neurological: Positive for weakness. Negative for sensory change, speech change, focal weakness and headaches.  Endo/Heme/Allergies: Does not bruise/bleed easily.  Psychiatric/Behavioral: Negative for depression. The patient is not nervous/anxious.     DRUG ALLERGIES:   Allergies  Allergen Reactions  . Macrobid [Nitrofurantoin Monohyd Macro]     VITALS:  Blood pressure (!) 156/58, pulse 64, temperature 98.3 F (36.8 C), temperature source Oral, resp. rate 20, height 5\' 2"  (1.575 m), weight 69.4 kg (152 lb 14.4 oz), SpO2 98 %.  PHYSICAL EXAMINATION:   Physical Exam  GENERAL:  82 y.o.-year-old patient lying in the bed with no acute distress.  EYES: Pupils equal, round, reactive to light and accommodation. No scleral icterus. Extraocular muscles intact.  HEENT: Head atraumatic, normocephalic. Oropharynx and nasopharynx clear.  NECK:  Supple, no jugular venous distention. No thyroid enlargement, no tenderness.  LUNGS: Normal breath sounds  bilaterally, no wheezing, rales, rhonchi. No use of accessory muscles of respiration.  CARDIOVASCULAR: S1, S2 normal. No murmurs, rubs, or gallops.  ABDOMEN: Soft, nontender, nondistended. Bowel sounds present. No organomegaly or mass.  EXTREMITIES: No cyanosis, clubbing or edema b/l.    NEUROLOGIC: Cranial nerves II through XII are intact. No focal Motor or sensory deficits b/l.   PSYCHIATRIC: The patient is alert and awake SKIN: No obvious rash, lesion, or ulcer.   LABORATORY PANEL:   CBC  Recent Labs Lab 07/12/16 1118  07/13/16 0742  WBC 6.0  --   --   HGB 8.0*  < > 10.0*  HCT 25.2*  --   --   PLT 249  --   --   < > = values in this interval not displayed. ------------------------------------------------------------------------------------------------------------------ Chemistries   Recent Labs Lab 07/12/16 1118 07/13/16 0742  NA 137 140  K 4.7 4.5  CL 105 109  CO2 25 26  GLUCOSE 121* 108*  BUN 44* 40*  CREATININE 3.33* 2.94*  CALCIUM 9.1 9.0  AST 27  --   ALT 10*  --   ALKPHOS 63  --   BILITOT 0.4  --    ------------------------------------------------------------------------------------------------------------------  Cardiac Enzymes  Recent Labs Lab 07/12/16 1118  TROPONINI <0.03   ------------------------------------------------------------------------------------------------------------------  RADIOLOGY:  Dg Chest Portable 1 View  Result Date: 07/12/2016 CLINICAL DATA:  Chest pressure and discomfort EXAM: PORTABLE CHEST 1 VIEW COMPARISON:  11/20/2015 FINDINGS: Aortic atherosclerosis noted. The heart size and mediastinal contours are within normal limits. Both lungs are clear. The visualized skeletal structures are unremarkable. IMPRESSION: No active disease. Electronically Signed   By: Signa Kellaylor  Stroud M.D.   On: 07/12/2016 11:47  ASSESSMENT AND PLAN:   * Chronic blood loss anemia likely due to lower GI source. Could be diverticular. Stool  positive for occult blood in the emergency room. No frank bleeding or melena noticed. 1 unit packed RBC transfused and hemoglobin improved. Monitor. Family does not want any endoscopy procedures or surgeries.  * Acute kidney injury over chronic kidney disease stage III Improving with IV fluids. Likely due to decreased oral intake..  * UTI. Treated with Bactrim as outpatient. Still has persistent urinary infection. On IV ceftriaxone. Cultures pending.  * Chronic diastolic CHF is stable. Monitor for fluid overload.  * Chest pain Could be due to anemia or stress. No cardiac catheterization or aggressive procedures as per family. Increased dose of Imdur.  All the records are reviewed and case discussed with Care Management/Social Workerr. Management plans discussed with the patient, family and they are in agreement.  CODE STATUS: DNR  DVT Prophylaxis: SCDs  TOTAL TIME TAKING CARE OF THIS PATIENT: 30 minutes.   POSSIBLE D/C IN 1-2 DAYS, DEPENDING ON CLINICAL CONDITION.  Milagros LollSudini, Shelsy Seng R M.D on 07/13/2016 at 11:30 AM  Between 7am to 6pm - Pager - [phone]  After 6pm go to [email or web address] - password [email or web address] [facility]  [facility] [place] [facility]  Office  [phone]  CC: Primary care physician; [name], [name], MD  Note: This dictation was prepared with Dragon dictation along with smaller phrase technology. Any transcriptional errors that result from this process are unintentional.

## 2016-07-13 NOTE — Care Management (Addendum)
Admitted to [facility] with the diagnosis of GI bleed. Lives with (son)  family. Daughters are Alphonsa OverallCarolyn Johnson [phone]) and [name] [phone] Last seen Dr. Burnadette PopLinthavong 03/24/16. Followed by [facility] in the past. Followed by [facility] until August 2017.  Will update Dayna BarkerKaren Robertson, RN representative for [facility].  Family states that they have been taking care of their family member since August, [facility].  Wheelchair, bedside commode, shower chair, lift, and nebulizer  in the home. [facility] and [facility] in the past. [facility].   Family at the bedside. Son, Dorene SorrowJerry, expired in the emergency room this morning. Gwenette GreetBrenda S Loudon Krakow RN MSN CCM Care Management [phone]

## 2016-07-13 NOTE — Care Management Important Message (Signed)
Important Message  Patient Details  Name: Lowella FairyMarie E Ditmars MRN: [id number] Date of Birth: 11/09/1923   Medicare Important Message Given:  Yes    Gwenette GreetBrenda S Owen Pratte, RN 07/13/2016, 8:50 AM

## 2016-07-13 NOTE — Consult Note (Signed)
   [facility] LPHN CM Inpatient Consult   07/13/2016  Melissa Bright 07/19/1924 [id number]   Patient screened for potential [facility] Care Management services. Patient is eligible for [facility] Care Management Services. Electronic medical record reveals patient being followed by hospice. [facility] Care Management services not appropriate at this time. If patient's post hospital needs change please place a [facility] Care [facility] consult. For questions please contact:   Tamira Ryland RN, BSN [facility]  Hospital Liaison  [phone]) Business Mobile [phone]) Toll free office

## 2016-07-13 NOTE — Progress Notes (Signed)
CH responded to a page to visit with Pt, who was mourning a loss of a family member who passed away last night. Pt lived in the deceased who was 80 years old, and was shocked when she had of his passing. Pt was with her family in room when the [facility]. CH provided presence, read Scriptures for the family, and prayed for the Pt and her family. CH also provided Pt's family with water and the left. CH promised the family that he was available if needed.   07/13/16 1100  Clinical Encounter Type  Visited With Patient and family together  Visit Type Initial;Follow-up;Spiritual support  Referral From Chaplain;Nurse  Consult/Referral To Chaplain  Spiritual Encounters  Spiritual Needs Prayer;Emotional  Stress Factors  Patient Stress Factors Loss  Family Stress Factors Loss

## 2016-07-14 LAB — BASIC METABOLIC PANEL WITH GFR
Anion gap: 6 (ref 5–15)
BUN: 43 mg/dL — ABNORMAL HIGH (ref 6–20)
CO2: 24 mmol/L (ref 22–32)
Calcium: 8.7 mg/dL — ABNORMAL LOW (ref 8.9–10.3)
Chloride: 111 mmol/L (ref 101–111)
Creatinine, Ser: 2.81 mg/dL — ABNORMAL HIGH (ref 0.44–1.00)
GFR calc Af Amer: 16 mL/min — ABNORMAL LOW
GFR calc non Af Amer: 14 mL/min — ABNORMAL LOW
Glucose, Bld: 105 mg/dL — ABNORMAL HIGH (ref 65–99)
Potassium: 4.6 mmol/L (ref 3.5–5.1)
Sodium: 141 mmol/L (ref 135–145)

## 2016-07-14 LAB — CBC WITH DIFFERENTIAL/PLATELET
BASOS ABS: 0 10*3/uL (ref 0–0.1)
BASOS PCT: 0 %
EOS ABS: 0.2 10*3/uL (ref 0–0.7)
EOS PCT: 3 %
HCT: 27 % — ABNORMAL LOW (ref 35.0–47.0)
Hemoglobin: 9 g/dL — ABNORMAL LOW (ref 12.0–16.0)
LYMPHS PCT: 17 %
Lymphs Abs: 1 10*3/uL (ref 1.0–3.6)
MCH: 30.4 pg (ref 26.0–34.0)
MCHC: 33.5 g/dL (ref 32.0–36.0)
MCV: 90.8 fL (ref 80.0–100.0)
Monocytes Absolute: 0.5 10*3/uL (ref 0.2–0.9)
Monocytes Relative: 9 %
Neutro Abs: 4.3 10*3/uL (ref 1.4–6.5)
Neutrophils Relative %: 71 %
PLATELETS: 229 10*3/uL (ref 150–440)
RBC: 2.97 MIL/uL — AB (ref 3.80–5.20)
RDW: 16.9 % — ABNORMAL HIGH (ref 11.5–14.5)
WBC: 6 10*3/uL (ref 3.6–11.0)

## 2016-07-14 LAB — URINE CULTURE: Culture: NO GROWTH

## 2016-07-14 LAB — HEMOGLOBIN
HEMOGLOBIN: 8.2 g/dL — AB (ref 12.0–16.0)
Hemoglobin: 8.9 g/dL — ABNORMAL LOW (ref 12.0–16.0)

## 2016-07-14 MED ORDER — ALPRAZOLAM 0.5 MG PO TABS
0.5000 mg | ORAL_TABLET | Freq: Two times a day (BID) | ORAL | Status: DC | PRN
  Administered 2016-07-15: 09:00:00 0.5 mg via ORAL
  Filled 2016-07-14: qty 1

## 2016-07-14 NOTE — Progress Notes (Signed)
PG came in at morning report. PTs adult son had passed away due to head trauma. CH Fred took call to [facility].

## 2016-07-14 NOTE — Progress Notes (Signed)
While rounding, CH made initial visit to room 107. Family was bedside. Pt was alert but most of the conversation was with the daughter. Pt was hoping to be discharged today, but her blood pressure had risen and they will have to wait and see. CH provided prayer and presence. CH is available for follow up as needed.    07/14/16 1200  Clinical Encounter Type  Visited With Patient;Patient and family together  Visit Type Initial;Spiritual support  Referral From Chaplain;Nurse  Spiritual Encounters  Spiritual Needs Prayer  Stress Factors  Patient Stress Factors Health changes

## 2016-07-14 NOTE — Progress Notes (Signed)
[employer] - [place] at [facility]   PATIENT NAME: Melissa Bright    MR#:  [id number]  DATE OF BIRTH:  07/18/1924  SUBJECTIVE: Patient is seen at the bedside. Has back pain.  Elevated this morning. Spoke with patient's daughter requesting something for anxiety. Pt going thru bereavement due to her son's death.   CHIEF COMPLAINT:   Chief Complaint  Patient presents with  . Chest Pain     REVIEW OF SYSTEMS:    Review of Systems  Constitutional: Positive for malaise/fatigue. Negative for chills and fever.  HENT: Negative for sore throat.   Eyes: Negative for blurred vision, double vision and pain.  Respiratory: Negative for cough, hemoptysis, shortness of breath and wheezing.   Cardiovascular: Negative for chest pain, palpitations, orthopnea and leg swelling.  Gastrointestinal: Negative for abdominal pain, constipation, diarrhea, heartburn, nausea and vomiting.  Genitourinary: Negative for dysuria and hematuria.  Musculoskeletal: Negative for back pain and joint pain.  Skin: Negative for rash.  Neurological: Negative for sensory change, speech change, focal weakness, weakness and headaches.  Endo/Heme/Allergies: Does not bruise/bleed easily.  Psychiatric/Behavioral: Negative for depression. The patient is not nervous/anxious.     DRUG ALLERGIES:   Allergies  Allergen Reactions  . Macrobid [Nitrofurantoin Monohyd Macro]     VITALS:  Blood pressure (!) 140/42, pulse (!) 58, temperature 98 F (36.7 C), temperature source Oral, resp. rate 18, height 5\' 2"  (1.575 m), weight 69.4 kg (152 lb 14.4 oz), SpO2 97 %.  PHYSICAL EXAMINATION:   Physical Exam  GENERAL:  82 y.o.-year-old patient lying in the bed with no acute distress.  EYES: Pupils equal, round, reactive to light and accommodation. No scleral icterus. Extraocular muscles intact.  HEENT: Head atraumatic, normocephalic. Oropharynx and nasopharynx clear.  NECK:  Supple, no jugular venous distention. No  thyroid enlargement, no tenderness.  LUNGS: Normal breath sounds bilaterally, no wheezing, rales, rhonchi. No use of accessory muscles of respiration.  CARDIOVASCULAR: S1, S2 normal. No murmurs, rubs, or gallops.  ABDOMEN: Soft, nontender, nondistended. Bowel sounds present. No organomegaly or mass.  EXTREMITIES: No cyanosis, clubbing or edema b/l.    NEUROLOGIC: Cranial nerves II through XII are intact. No focal Motor or sensory deficits b/l.   PSYCHIATRIC: The patient is alert and awake SKIN: No obvious rash, lesion, or ulcer.   LABORATORY PANEL:   CBC  Recent Labs Lab 07/14/16 0715  WBC 6.0  HGB 9.0*  HCT 27.0*  PLT 229   ------------------------------------------------------------------------------------------------------------------ Chemistries   Recent Labs Lab 07/12/16 1118  07/14/16 0715  NA 137  < > 141  K 4.7  < > 4.6  CL 105  < > 111  CO2 25  < > 24  GLUCOSE 121*  < > 105*  BUN 44*  < > 43*  CREATININE 3.33*  < > 2.81*  CALCIUM 9.1  < > 8.7*  AST 27  --   --   ALT 10*  --   --   ALKPHOS 63  --   --   BILITOT 0.4  --   --   < > = values in this interval not displayed. ------------------------------------------------------------------------------------------------------------------  Cardiac Enzymes  Recent Labs Lab 07/12/16 1118  TROPONINI <0.03   ------------------------------------------------------------------------------------------------------------------  RADIOLOGY:  Dg Chest Portable 1 View  Result Date: 07/12/2016 CLINICAL DATA:  Chest pressure and discomfort EXAM: PORTABLE CHEST 1 VIEW COMPARISON:  11/20/2015 FINDINGS: Aortic atherosclerosis noted. The heart size and mediastinal contours are within normal limits. Both lungs are clear. The  visualized skeletal structures are unremarkable. IMPRESSION: No active disease. Electronically Signed   By: Signa Kellaylor  Stroud M.D.   On: 07/12/2016 11:47     ASSESSMENT AND PLAN:   * Chronic blood loss  anemia likely due to lower GI source. Could be diverticular. Stool positive for occult blood in the emergency room. No frank bleeding or melena noticed. 1 unit packed RBC transfused and hemoglobin improved. Monitor. Family does not want any endoscopy procedures or surgeries.  * Acute kidney injury over chronic kidney disease stage III Improving with IV fluids. Likely due to decreased oral intake..  * UTI. Treated with Bactrim as outpatient. Still has persistent urinary infection. On IV ceftriaxone. Urine cultures from November 12 did not show any growth. Started after today's dose, patient is given 3 days of IV antibiotics . * Chronic diastolic CHF is stable. Monitor for fluid overload.  * Chest pain  Could be due to anemia or stress. No cardiac catheterization or aggressive procedures as per family. Increased dose of Imdur.  *Malignant hypertension: Patient received hydralazine, Coreg, amlodipine. Blood pressure is better. Started on Xanax.  Pt is wheelchair-bound at home, likely discharge tomorrow.d/w  patient's daughter.  All the records are reviewed and case discussed with Care Management/Social Workerr. Management plans discussed with the patient, family and they are in agreement.  CODE STATUS: DNR  DVT Prophylaxis: SCDs  TOTAL TIME TAKING CARE OF THIS PATIENT: 30 minutes.   POSSIBLE D/C IN 1-2 DAYS, DEPENDING ON CLINICAL CONDITION.  Katha HammingKONIDENA,Draeden Kellman M.D on 07/14/2016 at 9:22 AM  Between 7am to 6pm - Pager - [phone]  After 6pm go to [email or web address] - password [email or web address] [facility]  [facility] [place] [facility]  Office  [phone]  CC: Primary care physician; [name], [name], MD  Note: This dictation was prepared with Dragon dictation along with smaller phrase technology. Any transcriptional errors that result from this process are unintentional.

## 2016-07-15 LAB — HEMOGLOBIN: HEMOGLOBIN: 8.8 g/dL — AB (ref 12.0–16.0)

## 2016-07-15 MED ORDER — FUROSEMIDE 10 MG/ML IJ SOLN
20.0000 mg | Freq: Once | INTRAMUSCULAR | Status: AC
  Administered 2016-07-15: 09:00:00 20 mg via INTRAVENOUS
  Filled 2016-07-15: qty 2

## 2016-07-15 MED ORDER — IPRATROPIUM-ALBUTEROL 0.5-2.5 (3) MG/3ML IN SOLN
3.0000 mL | Freq: Once | RESPIRATORY_TRACT | Status: AC
  Administered 2016-07-15: 09:00:00 3 mL via RESPIRATORY_TRACT
  Filled 2016-07-15: qty 3

## 2016-07-15 MED ORDER — AMLODIPINE BESYLATE 10 MG PO TABS: 10.0000 mg | ORAL_TABLET | Freq: Every day | ORAL | Status: DC

## 2016-07-15 MED ORDER — AMLODIPINE BESYLATE 5 MG PO TABS
5.0000 mg | ORAL_TABLET | Freq: Two times a day (BID) | ORAL | Status: DC
  Administered 2016-07-15 – 2016-07-16 (×2): 5 mg via ORAL
  Filled 2016-07-15 (×2): qty 1

## 2016-07-15 NOTE — Plan of Care (Signed)
Problem: Physical Regulation: Goal: Ability to maintain clinical measurements within normal limits will improve Outcome: Progressing In am pt was SOB, wheezes, BP 190/78, anxious. MD notified, lasix, duoneb and xanax given with improvement. Scheduled BP meds given with improvement.

## 2016-07-15 NOTE — Progress Notes (Signed)
[facility] - [place] at [facility]   PATIENT NAME: Melissa GuilesMarie Bright    MR#:  [id number]  DATE OF BIRTH:  11/10/1923  SUBJECTIVE: Has shortness of breath, some wheezing reported by family and patient. Patient feels weak.   CHIEF COMPLAINT:   Chief Complaint  Patient presents with  . Chest Pain     REVIEW OF SYSTEMS:    Review of Systems  Constitutional: Positive for malaise/fatigue. Negative for chills and fever.  HENT: Negative for sore throat.   Eyes: Negative for blurred vision, double vision and pain.  Respiratory: Negative for cough, hemoptysis, shortness of breath and wheezing.   Cardiovascular: Negative for chest pain, palpitations, orthopnea and leg swelling.  Gastrointestinal: Negative for abdominal pain, constipation, diarrhea, heartburn, nausea and vomiting.  Genitourinary: Negative for dysuria and hematuria.  Musculoskeletal: Negative for back pain and joint pain.  Skin: Negative for rash.  Neurological: Negative for sensory change, speech change, focal weakness, weakness and headaches.  Endo/Heme/Allergies: Does not bruise/bleed easily.  Psychiatric/Behavioral: Negative for depression. The patient is not nervous/anxious.     DRUG ALLERGIES:   Allergies  Allergen Reactions  . Macrobid [Nitrofurantoin Monohyd Macro]     VITALS:  Blood pressure (!) 124/46, pulse 68, temperature 98.7 F (37.1 C), temperature source Oral, resp. rate (!) 23, height 5\' 2"  (1.575 m), weight 69.4 kg (152 lb 14.4 oz), SpO2 96 %.  PHYSICAL EXAMINATION:   Physical Exam  GENERAL:  82 y.o.-year-old patient lying in the bed with no acute distress.  EYES: Pupils equal, round, reactive to light and accommodation. No scleral icterus. Extraocular muscles intact.  HEENT: Head atraumatic, normocephalic. Oropharynx and nasopharynx clear.  NECK:  Supple, no jugular venous distention. No thyroid enlargement, no tenderness.  LUNGS: Very faint expiratory wheeze in upper parts of the  lungs.NO, rhonchi. No use of accessory muscles of respiration.  CARDIOVASCULAR: S1, S2 normal. No murmurs, rubs, or gallops.  ABDOMEN: Soft, nontender, nondistended. Bowel sounds present. No organomegaly or mass.  EXTREMITIES: No cyanosis, clubbing or edema b/l.    NEUROLOGIC: Cranial nerves II through XII are intact. No focal Motor or sensory deficits b/l.   PSYCHIATRIC: The patient is alert and awake SKIN: No obvious rash, lesion, or ulcer.   LABORATORY PANEL:   CBC  Recent Labs Lab 07/14/16 0715  07/15/16 0716  WBC 6.0  --   --   HGB 9.0*  < > 8.8*  HCT 27.0*  --   --   PLT 229  --   --   < > = values in this interval not displayed. ------------------------------------------------------------------------------------------------------------------ Chemistries   Recent Labs Lab 07/12/16 1118  07/14/16 0715  NA 137  < > 141  K 4.7  < > 4.6  CL 105  < > 111  CO2 25  < > 24  GLUCOSE 121*  < > 105*  BUN 44*  < > 43*  CREATININE 3.33*  < > 2.81*  CALCIUM 9.1  < > 8.7*  AST 27  --   --   ALT 10*  --   --   ALKPHOS 63  --   --   BILITOT 0.4  --   --   < > = values in this interval not displayed. ------------------------------------------------------------------------------------------------------------------  Cardiac Enzymes  Recent Labs Lab 07/12/16 1118  TROPONINI <0.03   ------------------------------------------------------------------------------------------------------------------  RADIOLOGY:  No results found.   ASSESSMENT AND PLAN:   * Chronic blood loss anemia likely due to lower GI source. Could be  diverticular. Stool positive for occult blood in the emergency room. No frank bleeding or melena noticed. 1 unit packed RBC transfused and hemoglobin improved. Monitor. Family does not want any endoscopy procedures or surgeries.  * Acute kidney injury over chronic kidney disease stage III Improved with IV fluids.  * UTI. Treated with Bactrim as  outpatient. Still has persistent urinary infection. On IV ceftriaxone. Urine cultures from November 12 did not show any growth. Received 3 days  of IV antibiotics. . * Chronic diastolic CHF is stable  * Chest pain  Could be due to anemia or stress. No cardiac catheterization or aggressive procedures as per family. Increased dose of Imdur.hb stable   *Malignant hypertension: Patient received hydralazine, Coreg, amlodipine.    Reactive airway disease: On nebulizers. Also gave cough lozenges.  Physical  therapy consult today, discussed with patient's daughter, possible discharge tomorrow. All the records are reviewed and case discussed with Care Management/Social Workerr. Management plans discussed with the patient, family and they are in agreement.  CODE STATUS: DNR  DVT Prophylaxis: SCDs  TOTAL TIME TAKING CARE OF THIS PATIENT: 30 minutes.   POSSIBLE D/C IN 1-2 DAYS, DEPENDING ON CLINICAL CONDITION.  Katha HammingKONIDENA,Dayleen Beske M.D on 07/15/2016 at 11:15 AM  Between 7am to 6pm - Pager - [phone]  After 6pm go to [email or web address] - password [email or web address] [facility]  [facility] [place] [facility]  Office  [phone]  CC: Primary care physician; [name], [name], MD  Note: This dictation was prepared with Dragon dictation along with smaller phrase technology. Any transcriptional errors that result from this process are unintentional.

## 2016-07-16 MED ORDER — AMLODIPINE BESYLATE 5 MG PO TABS: 5.0000 mg | ORAL_TABLET | Freq: Two times a day (BID) | ORAL | 0 refills | Status: DC

## 2016-07-16 MED ORDER — AMLODIPINE BESYLATE 5 MG PO TABS
2.5000 mg | ORAL_TABLET | Freq: Two times a day (BID) | ORAL | Status: DC
  Administered 2016-07-16 – 2016-07-17 (×2): 2.5 mg via ORAL
  Filled 2016-07-16 (×2): qty 1

## 2016-07-16 NOTE — Progress Notes (Signed)
[employer] - [place] [facility]   PATIENT NAME: Melissa Bright    MR#:  [id number]  DATE OF BIRTH:  05/22/1924  SUBJECTIVE: no sob.BP better today.waiting for PT input for final dispo.  CHIEF COMPLAINT:   Chief Complaint  Patient presents with  . Chest Pain     REVIEW OF SYSTEMS:    Review of Systems  Constitutional: Positive for malaise/fatigue. Negative for chills and fever.  HENT: Negative for sore throat.   Eyes: Negative for blurred vision, double vision and pain.  Respiratory: Negative for cough, hemoptysis, shortness of breath and wheezing.   Cardiovascular: Negative for chest pain, palpitations, orthopnea and leg swelling.  Gastrointestinal: Negative for abdominal pain, constipation, diarrhea, heartburn, nausea and vomiting.  Genitourinary: Negative for dysuria and hematuria.  Musculoskeletal: Negative for back pain and joint pain.  Skin: Negative for rash.  Neurological: Negative for sensory change, speech change, focal weakness, weakness and headaches.  Endo/Heme/Allergies: Does not bruise/bleed easily.  Psychiatric/Behavioral: Negative for depression. The patient is not nervous/anxious.     DRUG ALLERGIES:   Allergies  Allergen Reactions  . Macrobid [Nitrofurantoin Monohyd Macro]     VITALS:  Blood pressure (!) 148/52, pulse 74, temperature 98.3 F (36.8 C), temperature source Oral, resp. rate 16, height 5\' 2"  (1.575 m), weight 69.4 kg (152 lb 14.4 oz), SpO2 95 %.  PHYSICAL EXAMINATION:   Physical Exam  GENERAL:  82 y.o.-year-old patient lying in the bed with no acute distress.  EYES: Pupils equal, round, reactive to light and accommodation. No scleral icterus. Extraocular muscles intact.  HEENT: Head atraumatic, normocephalic. Oropharynx and nasopharynx clear.  NECK:  Supple, no jugular venous distention. No thyroid enlargement, no tenderness.  LUNGS: clear to auscultation.NO, rhonchi. No use of accessory muscles of respiration.   CARDIOVASCULAR: S1, S2 normal. No murmurs, rubs, or gallops.  ABDOMEN: Soft, nontender, nondistended. Bowel sounds present. No organomegaly or mass.  EXTREMITIES: No cyanosis, clubbing or edema b/l.    NEUROLOGIC: Cranial nerves II through XII are intact. No focal Motor or sensory deficits b/l.   PSYCHIATRIC: The patient is alert and awake SKIN: No obvious rash, lesion, or ulcer.   LABORATORY PANEL:   CBC  Recent Labs Lab 07/14/16 0715  07/15/16 0716  WBC 6.0  --   --   HGB 9.0*  < > 8.8*  HCT 27.0*  --   --   PLT 229  --   --   < > = values in this interval not displayed. ------------------------------------------------------------------------------------------------------------------ Chemistries   Recent Labs Lab 07/12/16 1118  07/14/16 0715  NA 137  < > 141  K 4.7  < > 4.6  CL 105  < > 111  CO2 25  < > 24  GLUCOSE 121*  < > 105*  BUN 44*  < > 43*  CREATININE 3.33*  < > 2.81*  CALCIUM 9.1  < > 8.7*  AST 27  --   --   ALT 10*  --   --   ALKPHOS 63  --   --   BILITOT 0.4  --   --   < > = values in this interval not displayed. ------------------------------------------------------------------------------------------------------------------  Cardiac Enzymes  Recent Labs Lab 07/12/16 1118  TROPONINI <0.03   ------------------------------------------------------------------------------------------------------------------  RADIOLOGY:  No results found.   ASSESSMENT AND PLAN:   * Chronic blood loss anemia likely due to lower GI source. Could be diverticular. Stool positive for occult blood in the emergency room. No frank bleeding  or melena noticed. 1 unit packed RBC transfused and hemoglobin improved. Monitor. Family does not want any endoscopy procedures or surgeries.  * Acute kidney injury over chronic kidney disease stage III Improved with IV fluids.  * UTI. Treated with Bactrim as outpatient. Still has persistent urinary infection. On IV ceftriaxone.  Urine cultures from November 12 did not show any growth. Received 3 days  of IV antibiotics. . * Chronic diastolic CHF is stable  * Chest pain  Could be due to anemia or stress. No cardiac catheterization or aggressive procedures as per family. Increased dose of Imdur.hb stable   *Malignant hypertension: Patient received hydralazine, Coreg, amlodipine.bp  Better Reactive airway disease: On nebulizers. Also gave cough lozenges. Discharge home today,pending PT eval.d/w daughter and RN  Physical  therapy consult today, discussed with patient's daughter, possible discharge tomorrow. All the records are reviewed and case discussed with Care Management/Social Workerr. Management plans discussed with the patient, family and they are in agreement.  CODE STATUS: DNR  DVT Prophylaxis: SCDs  TOTAL TIME TAKING CARE OF THIS PATIENT: 30 minutes.   POSSIBLE D/C IN 1-2 DAYS, DEPENDING ON CLINICAL CONDITION.  Katha HammingKONIDENA,Glennie Bose M.D on 07/16/2016 at 9:39 AM  Between 7am to 6pm - Pager - [phone]  After 6pm go to [email or web address] - password [email or web address] [facility]  [facility] [place] [facility]  Office  [phone]  CC: Primary care physician; [name], [name], MD  Note: This dictation was prepared with Dragon dictation along with smaller phrase technology. Any transcriptional errors that result from this process are unintentional.

## 2016-07-16 NOTE — Evaluation (Signed)
Physical Therapy Evaluation Patient Details Name: Melissa Bright MRN: [id number] DOB: 08/31/1923 Today's Date: 07/16/2016   History of Present Illness  82 year old female who for the past 3 days states that she's been feeling bad all over. She was prescribed Septra by her primary care physician for urinary tract infection as taking all her medications. During that time she's had some nausea and vomiting which her daughter thinks is from the medication. Today she started having some pain in her abdomen and chest. In the hospital she's found to be anemic.  Clinical Impression  Pt showed good effort with PT, but was very limited.  She does not walk at baseline, but is generally able to assist well with transfers (using walker) and does some basic ADLs, but per today's performance she is unsafe to return home.  Daughter present and eager to return her to home and she and her sister are able to give 24/7 assist (they have been helping for 15+ years, but son who lived with her and was there through the night died of a heart attack less than a week ago).  Pt struggled to get up to sitting, scoot to EOB and maintain sitting balance and despite max assist she was not able to get to fully upright standing and would not have been safe to try to transfer to the recliner.  Pt is not safe to return home at this time.     Follow Up Recommendations SNF    Equipment Recommendations       Recommendations for Other Services       Precautions / Restrictions Precautions Precautions: Fall Restrictions Weight Bearing Restrictions: No      Mobility  Bed Mobility Overal bed mobility: Needs Assistance Bed Mobility: Supine to Sit;Sit to Supine     Supine to sit: Mod assist;Max assist Sit to supine: Max assist   General bed mobility comments: Pt shows some effort with getting to EOB but ultimately is very weak and limited.  She struggles to keep herself upright sitting at EOB and needs constant hands-on  assist to insure she does not lean to the side or fall back   Transfers Overall transfer level: Needs assistance Equipment used: Rolling walker (2 wheeled) Transfers: Sit to/from Stand Sit to Stand: Max assist         General transfer comment: attempted X 2 with only mod assist, pt unable to rise.  On the 3rd attempt PT gave very heavy assist and though she showed good effort she was unable to assume full upright/straighten knees.  Pt unsafe and very weak with the effort.   Ambulation/Gait             General Gait Details: unable, unsafe to attempt - pt does not ambulate at baseline  Stairs            Wheelchair Mobility    Modified Rankin (Stroke Patients Only)       Balance Overall balance assessment: Needs assistance   Sitting balance-Leahy Scale: Poor       Standing balance-Leahy Scale: Zero Standing balance comment: Pt conpletely unable to maintain standing balance                             Pertinent Vitals/Pain Pain Assessment:  (pt does not c/o much pain t/o the session)    Home Living Family/patient expects to be discharged to:: Skilled nursing facility  Additional Comments: Initially family planning on taking her home, daughter realized that this would not be safe after seeing her work with PT    Prior Function Level of Independence: Needs assistance   Gait / Transfers Assistance Needed: Pt does not ambulate, uses walker and assist to do STP to w/c, etc  ADL's / Homemaking Assistance Needed: Pt's children assist with all ADLs.  Comments: son who lives with pt and is there at night died this past week, 2 daughters help each day but their normal schedule is obviously no longer an option     Hand Dominance        Extremity/Trunk Assessment   Upper Extremity Assessment: Generalized weakness (R shoulder AROM elevation to ~60 (3-/5), L overhead (3+/5)  )           Lower Extremity Assessment:  Generalized weakness (grossly 3+/5)         Communication   Communication: Expressive difficulties  Cognition Arousal/Alertness:  (awake, some confusion) Behavior During Therapy: Anxious (general confusion) Overall Cognitive Status: History of cognitive impairments - at baseline                      General Comments      Exercises     Assessment/Plan    PT Assessment Patient needs continued PT services  PT Problem List Decreased strength;Decreased activity tolerance;Decreased range of motion;Decreased balance;Decreased mobility;Decreased cognition;Decreased coordination;Decreased safety awareness;Decreased knowledge of use of DME          PT Treatment Interventions DME instruction;Gait training;Stair training;Functional mobility training;Therapeutic activities;Therapeutic exercise;Neuromuscular re-education;Balance training;Patient/family education    PT Goals (Current goals can be found in the Care Plan section)  Acute Rehab PT Goals Patient Stated Goal: daughter wanting to care for her at home PT Goal Formulation: With family Time For Goal Achievement: 07/30/16 Potential to Achieve Goals: Fair    Frequency Min 2X/week   Barriers to discharge        Co-evaluation               End of Session Equipment Utilized During Treatment: Gait belt Activity Tolerance: Patient limited by fatigue Patient left: with call bell/phone within reach;with nursing/sitter in room Nurse Communication: Mobility status         Time: 0923-0950 PT Time Calculation (min) (ACUTE ONLY): 27 min   Charges:   PT Evaluation $PT Eval Moderate Complexity: 1 Procedure     PT G Codes:        Malachi ProGalen R Maelys Kinnick, DPT 07/16/2016, 11:54 AM

## 2016-07-16 NOTE — Care Management Important Message (Signed)
Important Message  Patient Details  Name: Melissa Bright MRN: [id number] Date of Birth: 06/24/1924   Medicare Important Message Given:  Yes    Gwenette GreetBrenda S Sahana Boyland, RN 07/16/2016, 8:48 AM

## 2016-07-17 DIAGNOSIS — Z23 Encounter for immunization: Secondary | ICD-10-CM | POA: Diagnosis not present

## 2016-07-17 MED ORDER — AMLODIPINE BESYLATE 2.5 MG PO TABS: 2.5000 mg | ORAL_TABLET | Freq: Two times a day (BID) | ORAL | 0 refills | Status: DC

## 2016-07-17 MED ORDER — MAGNESIUM HYDROXIDE 400 MG/5ML PO SUSP
30.0000 mL | Freq: Once | ORAL | Status: AC
  Administered 2016-07-17: 30 mL via ORAL
  Filled 2016-07-17: qty 30

## 2016-07-17 MED ORDER — BISACODYL 10 MG RE SUPP
10.0000 mg | Freq: Once | RECTAL | Status: AC
Start: 2016-07-17 — End: 2016-07-17
  Administered 2016-07-17: 10 mg via RECTAL

## 2016-07-17 NOTE — Care Management (Signed)
Spoke with daughter, Eber JonesCarolyn. Would like to take mother home. The 2 daughters will rotate staying with their mother. They have wheelchair, rolling walker, and bedside commode in the home Discussed home health agencies. Chose [facility]. Feliberto GottronJason Hinton, [facility] representative updated.  Discharge to home today per Dr. Luberta MutterKonidena. Family will transport.  Gwenette GreetBrenda s Ekam Besson RN MSN CCM Care Management

## 2016-07-17 NOTE — Discharge Summary (Signed)
Melissa Bright, is a 82 y.o. female  DOB 07/29/1924  MRN [id number].  Admission date:  07/12/2016  Admitting Physician  Gracelyn NurseJohn D Johnston, MD  Discharge Date:  07/17/2016   Primary MD  [name], [name], MD  Recommendations for primary care physician for things to follow:   Follow-up with primary doctor in 1 week   Admission Diagnosis  Melena [K92.1] Acute blood loss anemia [D62] Acute cystitis without hematuria [N30.00] AKI (acute kidney injury) (HCC) [N17.9]   Discharge Diagnosis  Melena [K92.1] Acute blood loss anemia [D62] Acute cystitis without hematuria [N30.00] AKI (acute kidney injury) (HCC) [N17.9]    Active Problems:   GI bleed      Past Medical History:  Diagnosis Date  . Arthritis   . Blood transfusion without reported diagnosis   . CHF (congestive heart failure) (HCC)   . Coronary artery disease   . Hypertension   . Renal disorder   . Stroke [facility](HCC)     Past Surgical History:  Procedure Laterality Date  . ABDOMINAL HYSTERECTOMY    . CARDIAC SURGERY    . EYE SURGERY    . JOINT REPLACEMENT         History of present illness and  Hospital Course:     Kindly see H&P for history of present illness and admission details, please review complete Labs, Consult reports and Test reports for all details in brief  HPI  from the history and physical done on the day of admission  82 year old female patient came in because of some nausea, vomiting after she started to take Bactrim for her UTI. Patient admitted because of not feeling well, nausea, vomiting, en route to have anemia with guaiac positive stools  Hospital Course  #1 GI bleeding: Guaiac-positive stools. History of GI bleed before. Patient started on PPIs. Family refused aggressive GI intervention including endoscopies. Patient received 1  unit of transfusion. Hemoglobin 8 on admission, AFTER  transfusion ,HB stayed around 9. She will go home with PPIs, iron supplements.  #2 acute on chronic renal failure: Chronic kidney disease stage III: Received IV hydration, and BUN 44, creatinine 3.33 on admission, received IV hydration, creatinine decreased to 2.81, BUN 43.   #3. malignant hypertension: Adequate control now, patient on hydralazine, Coumadin Coumadin, amlodipine. Bp elevated at times because of the stress, patient son  Passed away recently and she used to live with him before so we gave her some arm Xanax to help her with stress, anxiety.  #4. Deconditioning ;physical therapy recommended skilled nursing but patient's daughter wanted to take her home.   5 history of UTI, treated with Bactrim as an outpatient causing some acute renal insufficiency, received IV hydration, received 3 days of Rocephin here. Urine cultures from no murmur 12 did not show any growth. We stopped the antibiotics.  Chronic diastolic heart failure: Stable. DNR   Discharge Condition: stable   Follow UP      Discharge Instructions  and  Discharge Medications     Discharge Instructions    Face-to-face encounter (required for Medicare/Medicaid patients)    Complete by:  As directed    I Kyarah Enamorado certify that this patient is under my care and that I, or a nurse practitioner or physician's assistant working with me, had a face-to-face encounter that meets the physician face-to-face encounter requirements with this patient on 07/17/2016. The encounter with the patient was in whole, or in part for the following medical condition(s) which is the primary reason for home  health care  HTN deconditioning   The encounter with the patient was in whole, or in part, for the following medical condition, which is the primary reason for home health care:  whole   I certify that, based on my findings, the following services are medically necessary  home health services:  Physical therapy   Reason for Medically Necessary Home Health Services:  Therapy- Instruction on Safe use of Assistive Devices for ADLs   My clinical findings support the need for the above services:  Unable to leave home safely without assistance and/or assistive device   Further, I certify that my clinical findings support that this patient is homebound due to:  Unsafe ambulation due to balance issues   Home Health    Complete by:  As directed    To provide the following care/treatments:   PT Home Health Aide Social work         Medication List    STOP taking these medications   furosemide 20 MG tablet Commonly known as:  LASIX     TAKE these medications   amLODipine 2.5 MG tablet Commonly known as:  NORVASC Take 1 tablet (2.5 mg total) by mouth 2 (two) times daily. What changed:  medication strength  how much to take  when to take this   atorvastatin 40 MG tablet Commonly known as:  LIPITOR Take 40 mg by mouth daily.   carvedilol 25 MG tablet Commonly known as:  COREG Take 1 tablet (25 mg total) by mouth 2 (two) times daily. What changed:  how much to take   docusate sodium 100 MG capsule Commonly known as:  COLACE Take 100 mg by mouth 2 (two) times daily.   ferrous gluconate 240 (27 FE) MG tablet Commonly known as:  FERGON Take 1 tablet (240 mg total) by mouth daily.   folic acid 800 MCG tablet Commonly known as:  FOLVITE Take 800 mcg by mouth daily.   hydrALAZINE 50 MG tablet Commonly known as:  APRESOLINE Take 1 tablet (50 mg total) by mouth every 8 (eight) hours.   ipratropium-albuterol 0.5-2.5 (3) MG/3ML Soln Commonly known as:  DUONEB Take 3 mLs by nebulization every 4 (four) hours as needed.   isosorbide mononitrate 30 MG 24 hr tablet Commonly known as:  IMDUR Take 30 mg by mouth daily.   levothyroxine 112 MCG tablet Commonly known as:  SYNTHROID, LEVOTHROID Take 112 mcg by mouth daily.   omega-3 acid ethyl esters 1  g capsule Commonly known as:  LOVAZA Take 1 g by mouth daily.   pantoprazole 40 MG tablet Commonly known as:  PROTONIX Take 1 tablet (40 mg total) by mouth 2 (two) times daily.   senna-docusate 8.6-50 MG tablet Commonly known as:  Senokot-S Take 1 tablet by mouth at bedtime as needed for mild constipation.   sodium bicarbonate 650 MG tablet Take 650 mg by mouth 2 (two) times daily.   vitamin B-12 1000 MCG tablet Commonly known as:  CYANOCOBALAMIN Take 1,000 mcg by mouth daily.         Diet and Activity recommendation: See Discharge Instructions above   Consults obtained -    PT   Major procedures and Radiology Reports - PLEASE review detailed and final reports for all details, in brief -      Dg Chest Portable 1 View  Result Date: 07/12/2016 CLINICAL DATA:  Chest pressure and discomfort EXAM: PORTABLE CHEST 1 VIEW COMPARISON:  11/20/2015 FINDINGS: Aortic atherosclerosis noted. The heart size and  mediastinal contours are within normal limits. Both lungs are clear. The visualized skeletal structures are unremarkable. IMPRESSION: No active disease. Electronically Signed   By: Signa Kell M.D.   On: 07/12/2016 11:47    Micro Results    Recent Results (from the past 240 hour(s))  Urine culture     Status: None   Collection Time: 07/12/16 12:02 PM  Result Value Ref Range Status   Specimen Description URINE, RANDOM  Final   Special Requests NONE  Final   Culture NO GROWTH Performed at [facility]   Final   Report Status 07/14/2016 FINAL  Final       Today   Subjective:   Melissa Bright today has Some constipation getting stool softeners after that she will go home, patient's daughter is at the bedside and they told me that they wanted to take her home.  Objective:   Blood pressure 129/61, pulse 62, temperature 98 F (36.7 C), temperature source Oral, resp. rate 20, height 5\' 2"  (1.575 m), weight 69.4 kg (152 lb 14.4 oz), SpO2 96  %.   Intake/Output Summary (Last 24 hours) at 07/17/16 0939 Last data filed at 07/17/16 0600  Gross per 24 hour  Intake              460 ml  Output                5 ml  Net              455 ml    Exam Awake Alert, Oriented x 3, No new F.N deficits, Normal affect Melissa Bright.AT,PERRAL Supple Neck,No JVD, No cervical lymphadenopathy appriciated.  Symmetrical Chest wall movement, Good air movement bilaterally, CTAB RRR,No Gallops,Rubs or new Murmurs, No Parasternal Heave +ve B.Sounds, Abd Soft, Non tender, No organomegaly appriciated, No rebound -guarding or rigidity. No Cyanosis, Clubbing or edema, No new Rash or bruise  Data Review   CBC w Diff: Lab Results  Component Value Date   WBC 6.0 07/14/2016   HGB 8.8 (L) 07/15/2016   HGB 11.1 (L) 12/27/2014   HCT 27.0 (L) 07/14/2016   HCT 34.4 (L) 12/27/2014   PLT 229 07/14/2016   PLT 198 12/27/2014   LYMPHOPCT 17 07/14/2016   LYMPHOPCT 14.0 09/02/2014   MONOPCT 9 07/14/2016   MONOPCT 10.4 09/02/2014   EOSPCT 3 07/14/2016   EOSPCT 4.7 09/02/2014   BASOPCT 0 07/14/2016   BASOPCT 0.5 09/02/2014    CMP: Lab Results  Component Value Date   NA 141 07/14/2016   NA 139 12/27/2014   K 4.6 07/14/2016   K 4.1 12/27/2014   CL 111 07/14/2016   CL 107 12/27/2014   CO2 24 07/14/2016   CO2 27 12/27/2014   BUN 43 (H) 07/14/2016   BUN 45 (H) 12/27/2014   CREATININE 2.81 (H) 07/14/2016   CREATININE 2.22 (H) 12/27/2014   PROT 6.5 07/12/2016   PROT 6.7 12/27/2014   ALBUMIN 2.7 (L) 07/12/2016   ALBUMIN 3.0 (L) 12/27/2014   BILITOT 0.4 07/12/2016   BILITOT 0.2 (L) 12/27/2014   ALKPHOS 63 07/12/2016   ALKPHOS 86 12/27/2014   AST 27 07/12/2016   AST 22 12/27/2014   ALT 10 (L) 07/12/2016   ALT 13 (L) 12/27/2014  .   Total Time in preparing paper work, data evaluation and todays exam - 35 minutes  Layton Naves M.D on 07/17/2016 at 9:39 AM    Note: This dictation was prepared with Dragon dictation along with smaller phrase  technology. Any transcriptional errors that result from this process are unintentional.

## 2016-07-17 NOTE — Progress Notes (Signed)
Received MD order to discharge patient to home with Home Health ([facility])  reviewed  Discharge instructions prescriptions and home meds with patient  and daughter and both verbalized understanding discharged to home with family and nursing staff in wheel chair

## 2016-07-27 NOTE — Progress Notes (Signed)
[facility]  Patient Status: Closed, see note from home health PT below:  Spoke with dtr Eber Jonesarolyn.  Dtr states this 82 yo pnt just buried her brother yesteday and pnt not wanting PT; stating she has had PT in the past. Dtr says maybe pnt might change her mind in a few weeks. Pnt has PCP appt on 07/31/16 per dtr.  I instructed dtr that we would d/c order at this time since she is refusing.  If pnt changes her mind in a few weeks, then her MD would need to contact us with a new order if MD feels PT is still needed.   Dimple CaseyJason E Hinton 07/27/2016, 8:57 AM

## 2016-07-31 DIAGNOSIS — R05 Cough: Secondary | ICD-10-CM | POA: Diagnosis not present

## 2016-07-31 DIAGNOSIS — D62 Acute posthemorrhagic anemia: Secondary | ICD-10-CM | POA: Diagnosis not present

## 2016-07-31 DIAGNOSIS — N184 Chronic kidney disease, stage 4 (severe): Secondary | ICD-10-CM | POA: Diagnosis not present

## 2016-07-31 DIAGNOSIS — N179 Acute kidney failure, unspecified: Secondary | ICD-10-CM | POA: Diagnosis not present

## 2016-08-01 ENCOUNTER — Inpatient Hospital Stay
Admission: EM | Admit: 2016-08-01 | Discharge: 2016-08-03 | DRG: 281 | Disposition: A | Discharge status: Home Health | Payer: Commercial Managed Care - HMO | Attending: Internal Medicine | Admitting: Internal Medicine

## 2016-08-01 ENCOUNTER — Encounter: Payer: Self-pay | Admitting: Emergency Medicine

## 2016-08-01 ENCOUNTER — Emergency Department: Payer: Commercial Managed Care - HMO

## 2016-08-01 DIAGNOSIS — Z8673 Personal history of transient ischemic attack (TIA), and cerebral infarction without residual deficits: Secondary | ICD-10-CM | POA: Diagnosis not present

## 2016-08-01 DIAGNOSIS — I10 Essential (primary) hypertension: Secondary | ICD-10-CM | POA: Diagnosis present

## 2016-08-01 DIAGNOSIS — J449 Chronic obstructive pulmonary disease, unspecified: Secondary | ICD-10-CM | POA: Diagnosis present

## 2016-08-01 DIAGNOSIS — I214 Non-ST elevation (NSTEMI) myocardial infarction: Secondary | ICD-10-CM | POA: Diagnosis not present

## 2016-08-01 DIAGNOSIS — D500 Iron deficiency anemia secondary to blood loss (chronic): Secondary | ICD-10-CM | POA: Diagnosis not present

## 2016-08-01 DIAGNOSIS — Z66 Do not resuscitate: Secondary | ICD-10-CM

## 2016-08-01 DIAGNOSIS — F03918 Unspecified dementia, unspecified severity, with other behavioral disturbance: Secondary | ICD-10-CM | POA: Diagnosis present

## 2016-08-01 DIAGNOSIS — I5032 Chronic diastolic (congestive) heart failure: Secondary | ICD-10-CM | POA: Diagnosis not present

## 2016-08-01 DIAGNOSIS — I2510 Atherosclerotic heart disease of native coronary artery without angina pectoris: Secondary | ICD-10-CM | POA: Diagnosis not present

## 2016-08-01 DIAGNOSIS — Z87891 Personal history of nicotine dependence: Secondary | ICD-10-CM

## 2016-08-01 DIAGNOSIS — I129 Hypertensive chronic kidney disease with stage 1 through stage 4 chronic kidney disease, or unspecified chronic kidney disease: Secondary | ICD-10-CM | POA: Diagnosis not present

## 2016-08-01 DIAGNOSIS — Z79899 Other long term (current) drug therapy: Secondary | ICD-10-CM

## 2016-08-01 DIAGNOSIS — F0391 Unspecified dementia with behavioral disturbance: Secondary | ICD-10-CM | POA: Diagnosis present

## 2016-08-01 DIAGNOSIS — N183 Chronic kidney disease, stage 3 unspecified: Secondary | ICD-10-CM

## 2016-08-01 DIAGNOSIS — R627 Adult failure to thrive: Secondary | ICD-10-CM | POA: Diagnosis not present

## 2016-08-01 DIAGNOSIS — N184 Chronic kidney disease, stage 4 (severe): Secondary | ICD-10-CM | POA: Diagnosis not present

## 2016-08-01 DIAGNOSIS — Z8249 Family history of ischemic heart disease and other diseases of the circulatory system: Secondary | ICD-10-CM

## 2016-08-01 DIAGNOSIS — R531 Weakness: Secondary | ICD-10-CM | POA: Diagnosis not present

## 2016-08-01 DIAGNOSIS — N179 Acute kidney failure, unspecified: Secondary | ICD-10-CM | POA: Diagnosis not present

## 2016-08-01 DIAGNOSIS — I130 Hypertensive heart and chronic kidney disease with heart failure and stage 1 through stage 4 chronic kidney disease, or unspecified chronic kidney disease: Secondary | ICD-10-CM | POA: Diagnosis not present

## 2016-08-01 DIAGNOSIS — D649 Anemia, unspecified: Secondary | ICD-10-CM | POA: Diagnosis not present

## 2016-08-01 DIAGNOSIS — R079 Chest pain, unspecified: Secondary | ICD-10-CM | POA: Diagnosis not present

## 2016-08-01 DIAGNOSIS — K922 Gastrointestinal hemorrhage, unspecified: Secondary | ICD-10-CM | POA: Diagnosis present

## 2016-08-01 LAB — CBC WITH DIFFERENTIAL/PLATELET
BASOS ABS: 0 10*3/uL (ref 0–0.1)
Basophils Relative: 1 %
EOS PCT: 5 %
Eosinophils Absolute: 0.3 10*3/uL (ref 0–0.7)
HCT: 24.1 % — ABNORMAL LOW (ref 35.0–47.0)
Hemoglobin: 8 g/dL — ABNORMAL LOW (ref 12.0–16.0)
LYMPHS PCT: 24 %
Lymphs Abs: 1.3 10*3/uL (ref 1.0–3.6)
MCH: 30.8 pg (ref 26.0–34.0)
MCHC: 33.1 g/dL (ref 32.0–36.0)
MCV: 93.1 fL (ref 80.0–100.0)
MONO ABS: 0.6 10*3/uL (ref 0.2–0.9)
Monocytes Relative: 10 %
Neutro Abs: 3.5 10*3/uL (ref 1.4–6.5)
Neutrophils Relative %: 60 %
PLATELETS: 321 10*3/uL (ref 150–440)
RBC: 2.59 MIL/uL — ABNORMAL LOW (ref 3.80–5.20)
RDW: 17.9 % — AB (ref 11.5–14.5)
WBC: 5.7 10*3/uL (ref 3.6–11.0)

## 2016-08-01 LAB — COMPREHENSIVE METABOLIC PANEL
ALK PHOS: 75 U/L (ref 38–126)
ALT: 13 U/L — AB (ref 14–54)
AST: 29 U/L (ref 15–41)
Albumin: 2.5 g/dL — ABNORMAL LOW (ref 3.5–5.0)
Anion gap: 7 (ref 5–15)
BILIRUBIN TOTAL: 0.4 mg/dL (ref 0.3–1.2)
BUN: 34 mg/dL — ABNORMAL HIGH (ref 6–20)
CALCIUM: 8.7 mg/dL — AB (ref 8.9–10.3)
CO2: 23 mmol/L (ref 22–32)
CREATININE: 2.83 mg/dL — AB (ref 0.44–1.00)
Chloride: 108 mmol/L (ref 101–111)
GFR, EST AFRICAN AMERICAN: 16 mL/min — AB (ref >60)
GFR, EST NON AFRICAN AMERICAN: 14 mL/min — AB (ref >60)
Glucose, Bld: 101 mg/dL — ABNORMAL HIGH (ref 65–99)
Potassium: 4.6 mmol/L (ref 3.5–5.1)
SODIUM: 138 mmol/L (ref 135–145)
Total Protein: 6.3 g/dL — ABNORMAL LOW (ref 6.5–8.1)

## 2016-08-01 LAB — URINALYSIS COMPLETE WITH MICROSCOPIC (ARMC ONLY)
BILIRUBIN URINE: NEGATIVE
Bacteria, UA: NONE SEEN
GLUCOSE, UA: NEGATIVE mg/dL
Hgb urine dipstick: NEGATIVE
Ketones, ur: NEGATIVE mg/dL
LEUKOCYTES UA: NEGATIVE
Nitrite: NEGATIVE
Protein, ur: 100 mg/dL — AB
Specific Gravity, Urine: 1.013 (ref 1.005–1.030)
pH: 6 (ref 5.0–8.0)

## 2016-08-01 LAB — TROPONIN I
TROPONIN I: 1.13 ng/mL — AB (ref <0.03)
Troponin I: 1.21 ng/mL (ref <0.03)

## 2016-08-01 LAB — LIPASE, BLOOD: Lipase: 30 U/L (ref 11–51)

## 2016-08-01 MED ORDER — DIPHENHYDRAMINE HCL 25 MG PO CAPS
25.0000 mg | ORAL_CAPSULE | Freq: Once | ORAL | Status: AC
  Administered 2016-08-02: 25 mg via ORAL
  Filled 2016-08-01: qty 1

## 2016-08-01 MED ORDER — PANTOPRAZOLE SODIUM 40 MG PO TBEC
40.0000 mg | DELAYED_RELEASE_TABLET | Freq: Two times a day (BID) | ORAL | Status: DC
  Administered 2016-08-02 – 2016-08-03 (×4): 40 mg via ORAL
  Filled 2016-08-01 (×4): qty 1

## 2016-08-01 MED ORDER — IPRATROPIUM-ALBUTEROL 0.5-2.5 (3) MG/3ML IN SOLN: 3.0000 mL | RESPIRATORY_TRACT | Status: DC | PRN

## 2016-08-01 MED ORDER — ACETAMINOPHEN 650 MG RE SUPP: 650.0000 mg | Freq: Four times a day (QID) | RECTAL | Status: DC | PRN

## 2016-08-01 MED ORDER — ACETAMINOPHEN 325 MG PO TABS
650.0000 mg | ORAL_TABLET | Freq: Four times a day (QID) | ORAL | Status: DC | PRN
  Administered 2016-08-03: 650 mg via ORAL
  Filled 2016-08-01: qty 2

## 2016-08-01 MED ORDER — CARVEDILOL 6.25 MG PO TABS
6.2500 mg | ORAL_TABLET | Freq: Two times a day (BID) | ORAL | Status: DC
  Administered 2016-08-02 – 2016-08-03 (×4): 6.25 mg via ORAL
  Filled 2016-08-01 (×4): qty 1

## 2016-08-01 MED ORDER — HYDRALAZINE HCL 50 MG PO TABS
50.0000 mg | ORAL_TABLET | Freq: Three times a day (TID) | ORAL | Status: DC
  Administered 2016-08-02 – 2016-08-03 (×5): 50 mg via ORAL
  Filled 2016-08-01 (×5): qty 1

## 2016-08-01 MED ORDER — ISOSORBIDE MONONITRATE ER 30 MG PO TB24
30.0000 mg | ORAL_TABLET | Freq: Every day | ORAL | Status: DC
  Administered 2016-08-02 – 2016-08-03 (×2): 30 mg via ORAL
  Filled 2016-08-01 (×2): qty 1

## 2016-08-01 MED ORDER — AMLODIPINE BESYLATE 5 MG PO TABS
2.5000 mg | ORAL_TABLET | Freq: Two times a day (BID) | ORAL | Status: DC
  Administered 2016-08-02 – 2016-08-03 (×4): 2.5 mg via ORAL
  Filled 2016-08-01 (×5): qty 1

## 2016-08-01 MED ORDER — ACETAMINOPHEN 325 MG PO TABS
650.0000 mg | ORAL_TABLET | Freq: Once | ORAL | Status: AC
  Administered 2016-08-02: 650 mg via ORAL

## 2016-08-01 MED ORDER — SODIUM CHLORIDE 0.9 % IV SOLN: Freq: Once | INTRAVENOUS | Status: DC

## 2016-08-01 MED ORDER — SODIUM CHLORIDE 0.9% FLUSH
3.0000 mL | Freq: Two times a day (BID) | INTRAVENOUS | Status: DC
  Administered 2016-08-01 – 2016-08-02 (×2): 3 mL via INTRAVENOUS

## 2016-08-01 MED ORDER — LEVOTHYROXINE SODIUM 112 MCG PO TABS
112.0000 ug | ORAL_TABLET | Freq: Every day | ORAL | Status: DC
  Administered 2016-08-02 – 2016-08-03 (×2): 112 ug via ORAL
  Filled 2016-08-01 (×2): qty 1

## 2016-08-01 MED ORDER — SODIUM BICARBONATE 650 MG PO TABS
650.0000 mg | ORAL_TABLET | Freq: Two times a day (BID) | ORAL | Status: DC
  Administered 2016-08-02 – 2016-08-03 (×4): 650 mg via ORAL
  Filled 2016-08-01 (×4): qty 1

## 2016-08-01 MED ORDER — ONDANSETRON HCL 4 MG PO TABS: 4.0000 mg | ORAL_TABLET | Freq: Four times a day (QID) | ORAL | Status: DC | PRN

## 2016-08-01 MED ORDER — MORPHINE SULFATE (PF) 4 MG/ML IV SOLN
2.0000 mg | INTRAVENOUS | Status: DC | PRN
  Administered 2016-08-01: 2 mg via INTRAVENOUS
  Filled 2016-08-01: qty 1

## 2016-08-01 MED ORDER — ATORVASTATIN CALCIUM 20 MG PO TABS
40.0000 mg | ORAL_TABLET | Freq: Every day | ORAL | Status: DC
  Administered 2016-08-02 – 2016-08-03 (×2): 40 mg via ORAL
  Filled 2016-08-01 (×2): qty 2

## 2016-08-01 MED ORDER — ONDANSETRON HCL 4 MG/2ML IJ SOLN: 4.0000 mg | Freq: Four times a day (QID) | INTRAMUSCULAR | Status: DC | PRN

## 2016-08-01 MED ORDER — LABETALOL HCL 5 MG/ML IV SOLN
10.0000 mg | INTRAVENOUS | Status: DC | PRN
  Administered 2016-08-01 – 2016-08-02 (×2): 10 mg via INTRAVENOUS
  Filled 2016-08-01 (×2): qty 4

## 2016-08-01 NOTE — ED Notes (Signed)
Dr. Willis in to see pt.

## 2016-08-01 NOTE — ED Provider Notes (Signed)
[facility] Emergency Department Provider Note  ____________________________________________   First MD Initiated Contact with Patient 08/01/16 1939     (approximate)  I have reviewed the triage vital signs and the nursing notes.   HISTORY  Chief Complaint Failure To Thrive  The patient has no history of dementia and is "sharp as a tack" per her daughters.  HPI Melissa Bright is a 82 y.o. female with an extensive chronic medical history including chronic and persistent GI bleeding and blood loss anemia as well as a distant history of CAD/ACS.  She presents by EMS for evaluation of general failure to thrive as well as acute chest pain today that occurred after eating something.  Apparently the pain was sharp and moderate in intensity but now feels better although she can still feel it.  She denies shortness of breath, diaphoresis, nausea or vomiting.  She continues to be tired and weak in general.  She lives at home.  Dr. Darrold Junker is her cardiologist.  She was admitted to the hospital several weeks ago for acute on chronic renal insufficiency as well as blood loss anemia and GI bleeding but the family at that time declined to proceed with any invasive procedures such as endoscopy's or colonoscopies.   Past Medical History:  Diagnosis Date  . Arthritis   . Blood transfusion without reported diagnosis   . CHF (congestive heart failure) (HCC)   . Coronary artery disease   . Hypertension   . Renal disorder   . Stroke [facility])     Patient Active Problem List   Diagnosis Date Noted  . NSTEMI (non-ST elevated myocardial infarction) (HCC) 08/01/2016  . GI bleed 07/12/2016  . Acute posthemorrhagic anemia 03/26/2016  . GIB (gastrointestinal bleeding) 03/26/2016  . Acute on chronic renal failure (HCC) 03/26/2016  . Pyuria 03/26/2016  . Essential hypertension, malignant 03/26/2016  . Dementia with behavioral disturbance 03/26/2016  . Symptomatic anemia 03/24/2016   . Chronic diastolic congestive heart failure (HCC)   . Respiratory failure with hypoxia (HCC) 11/20/2015  . COPD (chronic obstructive pulmonary disease) (HCC) 11/20/2015  . Left lower lobe pneumonia (HCC) 11/20/2015  . Leukocytosis 11/20/2015  . Malignant essential hypertension 11/20/2015  . CKD (chronic kidney disease), stage IV (HCC) 11/20/2015  . Anemia of chronic disease 11/20/2015  . Acute respiratory failure with hypoxia (HCC) 11/20/2015  . Accelerated hypertension 11/09/2015    Past Surgical History:  Procedure Laterality Date  . ABDOMINAL HYSTERECTOMY    . CARDIAC SURGERY    . EYE SURGERY    . JOINT REPLACEMENT      Prior to Admission medications   Medication Sig Start Date End Date Taking? Authorizing Provider  amLODipine (NORVASC) 2.5 MG tablet Take 1 tablet (2.5 mg total) by mouth 2 (two) times daily. 07/17/16   Katha Hamming, MD  atorvastatin (LIPITOR) 40 MG tablet Take 40 mg by mouth daily.    Historical Provider, MD  carvedilol (COREG) 25 MG tablet Take 1 tablet (25 mg total) by mouth 2 (two) times daily. Patient taking differently: Take 6.25 mg by mouth 2 (two) times daily.  03/26/16   Katharina Caper, MD  docusate sodium (COLACE) 100 MG capsule Take 100 mg by mouth 2 (two) times daily.    Historical Provider, MD  ferrous gluconate (FERGON) 240 (27 FE) MG tablet Take 1 tablet (240 mg total) by mouth daily. 11/22/15   Suan Halter, MD  folic acid (FOLVITE) 800 MCG tablet Take 800 mcg by mouth daily.  Historical Provider, MD  hydrALAZINE (APRESOLINE) 50 MG tablet Take 1 tablet (50 mg total) by mouth every 8 (eight) hours. 03/26/16   Katharina Caper, MD  ipratropium-albuterol (DUONEB) 0.5-2.5 (3) MG/3ML SOLN Take 3 mLs by nebulization every 4 (four) hours as needed. 11/22/15   Suan Halter, MD  isosorbide mononitrate (IMDUR) 30 MG 24 hr tablet Take 30 mg by mouth daily.    Historical Provider, MD  levothyroxine (SYNTHROID, LEVOTHROID) 112 MCG tablet Take  112 mcg by mouth daily.    Historical Provider, MD  omega-3 acid ethyl esters (LOVAZA) 1 g capsule Take 1 g by mouth daily.    Historical Provider, MD  pantoprazole (PROTONIX) 40 MG tablet Take 1 tablet (40 mg total) by mouth 2 (two) times daily. 03/26/16   Katharina Caper, MD  senna-docusate (SENOKOT-S) 8.6-50 MG tablet Take 1 tablet by mouth at bedtime as needed for mild constipation.    Historical Provider, MD  sodium bicarbonate 650 MG tablet Take 650 mg by mouth 2 (two) times daily.    Historical Provider, MD  vitamin B-12 (CYANOCOBALAMIN) 1000 MCG tablet Take 1,000 mcg by mouth daily.    Historical Provider, MD    Allergies Macrobid [nitrofurantoin monohyd macro]  Family History  Problem Relation Age of Onset  . Heart attack Father     Social History Social History  Substance Use Topics  . Smoking status: Former Games developer  . Smokeless tobacco: Never Used  . Alcohol use No    Review of Systems Constitutional: No fever/chills.  Generalized weakness and fatigue. Eyes: No visual changes. ENT: No sore throat. Cardiovascular: +chest pain. Respiratory: Denies shortness of breath. Gastrointestinal: No abdominal pain.  No nausea, no vomiting.  No diarrhea.  No constipation. Genitourinary: Negative for dysuria. Musculoskeletal: Negative for back pain. Skin: Negative for rash. Neurological: Negative for headaches, focal weakness or numbness.  10-point ROS otherwise negative.  ____________________________________________   PHYSICAL EXAM:  VITAL SIGNS: ED Triage Vitals  Enc Vitals Group     BP 08/01/16 1817 (!) 177/77     Pulse Rate 08/01/16 1817 69     Resp 08/01/16 1817 20     Temp 08/01/16 1817 98.4 F (36.9 C)     Temp Source 08/01/16 1817 Oral     SpO2 08/01/16 1817 98 %     Weight 08/01/16 1819 161 lb 9.6 oz (73.3 kg)     Height 08/01/16 1819 5\' 2"  (1.575 m)     Head Circumference --      Peak Flow --      Pain Score 08/01/16 1820 0     Pain Loc --      Pain Edu?  --      Excl. in GC? --     Constitutional: Alert and oriented At the patient appears quite chronically ill and debilitated Eyes: Conjunctivae are normal. PERRL. EOMI. Head: Atraumatic. Nose: No congestion/rhinnorhea. Mouth/Throat: Mucous membranes are moist.  Oropharynx non-erythematous. Neck: No stridor.  No meningeal signs.   Cardiovascular: Normal rate, regular rhythm. Good peripheral circulation. Grossly normal heart sounds. Respiratory: Normal respiratory effort.  No retractions. Lungs CTAB. Gastrointestinal: Soft and nontender. No distention.  Rectal:  deferred given known GI/rectal bleeding Musculoskeletal: No lower extremity tenderness nor edema. No gross deformities of extremities. Neurologic:  Normal speech and language. No gross focal neurologic deficits are appreciated.  Skin:  Skin is pale, warm, dry and intact. No rash noted. Psychiatric: Mood and affect are normal. Speech and behavior are normal.  ____________________________________________  LABS (all labs ordered are listed, but only abnormal results are displayed)  Labs Reviewed  COMPREHENSIVE METABOLIC PANEL - Abnormal; Notable for the following:       Result Value   Glucose, Bld 101 (*)    BUN 34 (*)    Creatinine, Ser 2.83 (*)    Calcium 8.7 (*)    Total Protein 6.3 (*)    Albumin 2.5 (*)    ALT 13 (*)    GFR calc non Af Amer 14 (*)    GFR calc Af Amer 16 (*)    All other components within normal limits  TROPONIN I - Abnormal; Notable for the following:    Troponin I 1.13 (*)    All other components within normal limits  CBC WITH DIFFERENTIAL/PLATELET - Abnormal; Notable for the following:    RBC 2.59 (*)    Hemoglobin 8.0 (*)    HCT 24.1 (*)    RDW 17.9 (*)    All other components within normal limits  URINALYSIS COMPLETEWITH MICROSCOPIC ([facility] ONLY) - Abnormal; Notable for the following:    Color, Urine YELLOW (*)    APPearance CLEAR (*)    Protein, ur 100 (*)    Squamous Epithelial / LPF 0-5  (*)    All other components within normal limits  LIPASE, BLOOD  TYPE AND SCREEN   ____________________________________________  EKG  ED ECG REPORT I, Lanie Schelling, the attending physician, personally viewed and interpreted this ECG.  Date: 08/01/2016 EKG Time: 20:23 Rate: 72 Rhythm: normal sinus rhythm QRS Axis: normal Intervals: normal ST/T Wave abnormalities: T-wave inversion in leads 3, aVF, V3, and V4.  These are new since her last EKG on record which was approximately 3 weeks ago.  No ST segment depression nor elevation. Conduction Disturbances: none Narrative Interpretation: unremarkable  ____________________________________________  RADIOLOGY   Dg Chest Portable 1 View  Result Date: 08/01/2016 CLINICAL DATA:  Patient was complaining of chest pain after eating. Daughter told EMS that she was not eating, has been dizzy and light headed today. Patient went to her PCP yesterday and was told her hemoglobin was low. She had a blood transfusion approx 2 weeks ago. She is wheezing, but able to take a deep breath Hx chf, cad, htn, renal disorder, stroke, former smoker, cardiac surgery EXAM: PORTABLE CHEST 1 VIEW COMPARISON:  07/12/2016 FINDINGS: Cardiac silhouette is normal in size. No mediastinal or hilar masses. No evidence of adenopathy. Stable elevation of left hemidiaphragm. Lungs are clear.  No pleural effusion or pneumothorax. Skeletal structures are demineralized but grossly intact. Stable right axillary vascular stent. IMPRESSION: No acute cardiopulmonary disease. Electronically Signed   By: Amie Portlandavid  Ormond M.D.   On: 08/01/2016 21:01    ____________________________________________   PROCEDURES  Procedure(s) performed:   Procedures   Critical Care performed: No ____________________________________________   INITIAL IMPRESSION / ASSESSMENT AND PLAN / ED COURSE  Pertinent labs & imaging results that were available during my care of the patient were reviewed by me  and considered in my medical decision making (see chart for details).  There was a delay of care as the patient had been in the emergency department approximately an hour and a half before I realized that no triage orders have been placed.  I then ordered standard labs including a troponin and we discovered at that time that her troponin is elevated at 1.13.  I ordered an EKG which did not been done previously and she has some nonspecific changes but that are suggestive  of ischemia as she has new T-wave inversions which were not present several weeks ago.  This may be demand ischemia due to her symptomatic anemia but it is concerning for NSTEMI degree of elevation of her troponin and the presence of chest pain earlier today.  However, given her chronic debilitated state and chronic medical issues, she is not a good interventional candidate.  I discussed the possibility of catheterization with her and her daughters and they agree that that would not be optimal.  I discussed the case with the hospitalist and he will admit her and will transfuse 2 units of blood once she gets upstairs so that she can get the full cross matched type specific blood that they would not provide in the emergency department.  She is obviously not a candidate for aspirin or heparin given her chronic GI bleeding and anemia.  I discussed all this with the patient and family and they understand.  They also confirmed that she is DO NOT RESUSCITATE/DO NOT INTUBATE although they do not have the paperwork with her but I have updated her record to reflect this request confirmed by all 3 daughters and the patient.  ____________________________________________  FINAL CLINICAL IMPRESSION(S) / ED DIAGNOSES  Final diagnoses:  NSTEMI (non-ST elevated myocardial infarction) (HCC)  Chronic GI bleeding  Chronic blood loss anemia  Stage 3 chronic kidney disease  DNR (do not resuscitate)     MEDICATIONS GIVEN DURING THIS  VISIT:  Medications - No data to display   NEW OUTPATIENT MEDICATIONS STARTED DURING THIS VISIT:  New Prescriptions   No medications on file    Modified Medications   No medications on file    Discontinued Medications   No medications on file     Note:  This document was prepared using Dragon voice recognition software and may include unintentional dictation errors.    Loleta Roseory Josimar Corning, MD 08/01/16 2127

## 2016-08-01 NOTE — H&P (Signed)
[facility] [place] [facility]   PATIENT NAME: Melissa Bright    MR#:  [id number]  DATE OF BIRTH:  04/06/1924  DATE OF ADMISSION:  08/01/2016  PRIMARY CARE PHYSICIAN: [name], [name], MD   REQUESTING/REFERRING PHYSICIAN: York CeriseForbach, MD  CHIEF COMPLAINT:   Chief Complaint  Patient presents with  . Failure To Thrive    HISTORY OF PRESENT ILLNESS:  Melissa GuilesMarie Slagter  is a 82 y.o. female who presents with Pain. Patient and family state that she had an episode of chest pain today which lasted for a brief period of time in the morning and got better, but then recurred in the afternoon and lasted a little bit longer and was slightly more intense. She denies any significant associated shortness of breath or diaphoresis. The pain was nonradiating and was centrally located in her chest. She had an episode of chest pain about 4-5 days ago as well, which was accompanied with diaphoresis and shortness of breath but also self resolved. She is currently chest pain-free. However, here in the ED she did have some new T-wave inversions on her EKG and her troponin was 1.1. Hospitalists were called for admission  Of note, the patient was also recently hospitalized here with GI bleeding. She refused endoscopy at that time.  Her hemoglobin is lower again today than it was at the time of that discharge.  PAST MEDICAL HISTORY:   Past Medical History:  Diagnosis Date  . Arthritis   . Blood transfusion without reported diagnosis   . CHF (congestive heart failure) (HCC)   . Coronary artery disease   . Hypertension   . Renal disorder   . Stroke [facility](HCC)     PAST SURGICAL HISTORY:   Past Surgical History:  Procedure Laterality Date  . ABDOMINAL HYSTERECTOMY    . CARDIAC SURGERY    . EYE SURGERY    . JOINT REPLACEMENT      SOCIAL HISTORY:   Social History  Substance Use Topics  . Smoking status: Former Games developermoker  . Smokeless tobacco: Never Used  . Alcohol use No    FAMILY  HISTORY:   Family History  Problem Relation Age of Onset  . Heart attack Father     DRUG ALLERGIES:   Allergies  Allergen Reactions  . Macrobid [employer][Nitrofurantoin Monohyd Macro]     MEDICATIONS AT HOME:   Prior to Admission medications   Medication Sig Start Date End Date Taking? Authorizing Provider  amLODipine (NORVASC) 2.5 MG tablet Take 1 tablet (2.5 mg total) by mouth 2 (two) times daily. 07/17/16   Katha HammingSnehalatha Konidena, MD  atorvastatin (LIPITOR) 40 MG tablet Take 40 mg by mouth daily.    Historical Provider, MD  carvedilol (COREG) 25 MG tablet Take 1 tablet (25 mg total) by mouth 2 (two) times daily. Patient taking differently: Take 6.25 mg by mouth 2 (two) times daily.  03/26/16   Katharina Caperima Vaickute, MD  docusate sodium (COLACE) 100 MG capsule Take 100 mg by mouth 2 (two) times daily.    Historical Provider, MD  ferrous gluconate (FERGON) 240 (27 FE) MG tablet Take 1 tablet (240 mg total) by mouth daily. 11/22/15   Suan HalterMargaret F Campbell, MD  folic acid (FOLVITE) 800 MCG tablet Take 800 mcg by mouth daily.    Historical Provider, MD  hydrALAZINE (APRESOLINE) 50 MG tablet Take 1 tablet (50 mg total) by mouth every 8 (eight) hours. 03/26/16   Katharina Caperima Vaickute, MD  ipratropium-albuterol (DUONEB) 0.5-2.5 (3) MG/3ML SOLN Take 3  mLs by nebulization every 4 (four) hours as needed. 11/22/15   Suan Halter, MD  isosorbide mononitrate (IMDUR) 30 MG 24 hr tablet Take 30 mg by mouth daily.    Historical Provider, MD  levothyroxine (SYNTHROID, LEVOTHROID) 112 MCG tablet Take 112 mcg by mouth daily.    Historical Provider, MD  omega-3 acid ethyl esters (LOVAZA) 1 g capsule Take 1 g by mouth daily.    Historical Provider, MD  pantoprazole (PROTONIX) 40 MG tablet Take 1 tablet (40 mg total) by mouth 2 (two) times daily. 03/26/16   Katharina Caper, MD  senna-docusate (SENOKOT-S) 8.6-50 MG tablet Take 1 tablet by mouth at bedtime as needed for mild constipation.    Historical Provider, MD  sodium bicarbonate  650 MG tablet Take 650 mg by mouth 2 (two) times daily.    Historical Provider, MD  vitamin B-12 (CYANOCOBALAMIN) 1000 MCG tablet Take 1,000 mcg by mouth daily.    Historical Provider, MD    REVIEW OF SYSTEMS:  Review of Systems  Constitutional: Negative for chills, fever, malaise/fatigue and weight loss.  HENT: Negative for ear pain, hearing loss and tinnitus.   Eyes: Negative for blurred vision, double vision, pain and redness.  Respiratory: Negative for cough, hemoptysis and shortness of breath.   Cardiovascular: Positive for chest pain. Negative for palpitations, orthopnea and leg swelling.  Gastrointestinal: Negative for abdominal pain, constipation, diarrhea, nausea and vomiting.  Genitourinary: Negative for dysuria, frequency and hematuria.  Musculoskeletal: Negative for back pain, joint pain and neck pain.  Skin:       No acne, rash, or lesions  Neurological: Negative for dizziness, tremors, focal weakness and weakness.  Endo/Heme/Allergies: Negative for polydipsia. Does not bruise/bleed easily.  Psychiatric/Behavioral: Negative for depression. The patient is not nervous/anxious and does not have insomnia.      VITAL SIGNS:   Vitals:   08/01/16 1900 08/01/16 1930 08/01/16 2031 08/01/16 2121  BP: (!) 180/98 (!) 171/96 (!) 180/57 (!) 169/79  Pulse: 71 69 70 69  Resp: 20 (!) 28 20 17   Temp:      TempSrc:      SpO2: 99% 98% 98% 98%  Weight:      Height:       Wt Readings from Last 3 Encounters:  08/01/16 73.3 kg (161 lb 9.6 oz)  07/12/16 69.4 kg (152 lb 14.4 oz)  03/24/16 70.9 kg (156 lb 6.4 oz)    PHYSICAL EXAMINATION:  Physical Exam  Vitals reviewed. Constitutional: She is oriented to person, place, and time. She appears well-developed and well-nourished. No distress.  HENT:  Head: Normocephalic and atraumatic.  Mouth/Throat: Oropharynx is clear and moist.  Eyes: Conjunctivae and EOM are normal. Pupils are equal, round, and reactive to light. No scleral icterus.   Neck: Normal range of motion. Neck supple. No JVD present. No thyromegaly present.  Cardiovascular: Normal rate, regular rhythm and intact distal pulses.  Exam reveals no gallop and no friction rub.   No murmur heard. Respiratory: Effort normal and breath sounds normal. No respiratory distress. She has no wheezes. She has no rales.  GI: Soft. Bowel sounds are normal. She exhibits no distension. There is no tenderness.  Musculoskeletal: Normal range of motion. She exhibits no edema.  No arthritis, no gout  Lymphadenopathy:    She has no cervical adenopathy.  Neurological: She is alert and oriented to person, place, and time. No cranial nerve deficit.  No dysarthria, no aphasia  Skin: Skin is warm and dry. No rash  noted. No erythema.  Psychiatric: She has a normal mood and affect. Her behavior is normal. Judgment and thought content normal.    LABORATORY PANEL:   CBC  Recent Labs Lab 08/01/16 1832  WBC 5.7  HGB 8.0*  HCT 24.1*  PLT 321   ------------------------------------------------------------------------------------------------------------------  Chemistries   Recent Labs Lab 08/01/16 1832  NA 138  K 4.6  CL 108  CO2 23  GLUCOSE 101*  BUN 34*  CREATININE 2.83*  CALCIUM 8.7*  AST 29  ALT 13*  ALKPHOS 75  BILITOT 0.4   ------------------------------------------------------------------------------------------------------------------  Cardiac Enzymes  Recent Labs Lab 08/01/16 1832  TROPONINI 1.13*   ------------------------------------------------------------------------------------------------------------------  RADIOLOGY:  Dg Chest Portable 1 View  Result Date: 08/01/2016 CLINICAL DATA:  Patient was complaining of chest pain after eating. Daughter told EMS that she was not eating, has been dizzy and light headed today. Patient went to her PCP yesterday and was told her hemoglobin was low. She had a blood transfusion approx 2 weeks ago. She is wheezing,  but able to take a deep breath Hx chf, cad, htn, renal disorder, stroke, former smoker, cardiac surgery EXAM: PORTABLE CHEST 1 VIEW COMPARISON:  07/12/2016 FINDINGS: Cardiac silhouette is normal in size. No mediastinal or hilar masses. No evidence of adenopathy. Stable elevation of left hemidiaphragm. Lungs are clear.  No pleural effusion or pneumothorax. Skeletal structures are demineralized but grossly intact. Stable right axillary vascular stent. IMPRESSION: No acute cardiopulmonary disease. Electronically Signed   By: Amie Portlandavid  Ormond M.D.   On: 08/01/2016 21:01    EKG:   Orders placed or performed during the hospital encounter of 08/01/16  . ED EKG  . ED EKG  . EKG 12-Lead  . EKG 12-Lead    IMPRESSION AND PLAN:  Principal Problem:   NSTEMI (non-ST elevated myocardial infarction) (HCC) - we are unable to heparinize this patient given the fact that she is likely still chronically GI bleeding. We've ordered a transfusion of a couple units of blood to try and get her hemoglobin closer to 10, it is currently 8. We will control her blood pressure, treat her with a beta blocker to try and lower her heart rate just a little more. Ordered an echocardiogram and a cardiology consult, we will trend her cardiac enzymes tonight. Active Problems:   Accelerated hypertension - he will use IV antihypertensives in addition to her home dose antihypertensives to get her blood pressure less than 160/100   GI bleed - this is likely a slow chronic bleed, or an intermittent slow bleed, we are giving her a transfusion as above   Chronic diastolic congestive heart failure (HCC) - last echocardiogram in March 2017 showed normal systolic function with grade 1 diastolic dysfunction. However, she has had significantly reduced EF in the past so we will transfuse her very slowly   CKD (chronic kidney disease), stage IV (HCC) - stable, at baseline, avoid nephrotoxins and monitor.  All the records are reviewed and case  discussed with ED provider. Management plans discussed with the patient and/or family.  DVT PROPHYLAXIS: Mechanical only  GI PROPHYLAXIS: PPI  ADMISSION STATUS: Inpatient  CODE STATUS: DNR    Code Status Orders        Start     Ordered   08/01/16 2128  Do not attempt resuscitation/DNR  Continuous    Question Answer Comment  In the event of cardiac or respiratory ARREST Do not call a "code blue"   In the event of cardiac or respiratory ARREST  Do not perform Intubation, CPR, defibrillation or ACLS   In the event of cardiac or respiratory ARREST Use medication by any route, position, wound care, and other measures to relive pain and suffering. May use oxygen, suction and manual treatment of airway obstruction as needed for comfort.      08/01/16 2127    Code Status History    Date Active Date Inactive Code Status Order ID Comments User Context   07/12/2016  3:02 PM 07/17/2016 11:16 PM DNR [id number]  Gracelyn Nurse, MD ED   07/12/2016 12:57 PM 07/12/2016  3:02 PM DNR [id number]  Willy Eddy, MD ED   03/24/2016  8:08 PM 03/26/2016  7:35 PM DNR [id number]  Houston Siren, MD Inpatient   11/21/2015 11:30 AM 11/22/2015  6:31 PM DNR [id number]  Enedina Finner, MD Inpatient   11/20/2015  1:12 PM 11/21/2015 11:30 AM Full Code [id number]  Katharina Caper, MD ED   11/09/2015  4:26 PM 11/10/2015  5:19 PM DNR [id number]  Houston Siren, MD Inpatient    Advance Directive Documentation   Flowsheet Row Most Recent Value  Type of Advance Directive  Healthcare Power of Attorney, Out of facility DNR (pink MOST or yellow form)  Pre-existing out of facility DNR order (yellow form or pink MOST form)  No data  "MOST" Form in Place?  No data    DNR  TOTAL TIME TAKING CARE OF THIS PATIENT: 45 minutes.    Jamel Dunton FIELDING 08/01/2016, 9:29 PM  [facility]  Office  [phone]  CC: Primary care physician; Marisue Ivan, MD

## 2016-08-01 NOTE — ED Triage Notes (Signed)
Pt arrived to ED by EMS from home. Pt's daughter called because the Pt was complaining of chest pain after eating. Pt currently states she has no pain. Per EMS daughter stated that the PT is not eating, has been dizzy and light headed today and that she went to her PCP yesterday and was told her hemoglobin was low. Pt had a blood transfusion approx 2 weeks ago. Pt can be heard wheezing. Per EMS daughter states Pt's wheezing is normal.

## 2016-08-01 NOTE — ED Notes (Signed)
Per Pt's daughter Pt has not been eating well since last hospital admit. Pt taking in fluids but per daughter "not like normal. Pt received blood transfusion during her admission and was seen by her PCP yesterday and her hemoglobin was low.

## 2016-08-02 ENCOUNTER — Inpatient Hospital Stay
Admit: 2016-08-02 | Discharge: 2016-08-02 | Disposition: A | Discharge status: Home / Self Care | Payer: Commercial Managed Care - HMO | Attending: Internal Medicine | Admitting: Internal Medicine

## 2016-08-02 ENCOUNTER — Inpatient Hospital Stay: Admit: 2016-08-02 | Payer: Commercial Managed Care - HMO

## 2016-08-02 LAB — BASIC METABOLIC PANEL
ANION GAP: 5 (ref 5–15)
BUN: 33 mg/dL — ABNORMAL HIGH (ref 6–20)
CO2: 24 mmol/L (ref 22–32)
Calcium: 8.6 mg/dL — ABNORMAL LOW (ref 8.9–10.3)
Chloride: 111 mmol/L (ref 101–111)
Creatinine, Ser: 2.6 mg/dL — ABNORMAL HIGH (ref 0.44–1.00)
GFR calc Af Amer: 18 mL/min — ABNORMAL LOW (ref >60)
GFR calc non Af Amer: 15 mL/min — ABNORMAL LOW (ref >60)
GLUCOSE: 92 mg/dL (ref 65–99)
POTASSIUM: 4.2 mmol/L (ref 3.5–5.1)
Sodium: 140 mmol/L (ref 135–145)

## 2016-08-02 LAB — CBC
HEMATOCRIT: 28.4 % — AB (ref 35.0–47.0)
HEMOGLOBIN: 9.5 g/dL — AB (ref 12.0–16.0)
MCH: 30.7 pg (ref 26.0–34.0)
MCHC: 33.4 g/dL (ref 32.0–36.0)
MCV: 91.8 fL (ref 80.0–100.0)
Platelets: 265 10*3/uL (ref 150–440)
RBC: 3.1 MIL/uL — ABNORMAL LOW (ref 3.80–5.20)
RDW: 16.7 % — ABNORMAL HIGH (ref 11.5–14.5)
WBC: 6.2 10*3/uL (ref 3.6–11.0)

## 2016-08-02 LAB — TROPONIN I
Troponin I: 1.08 ng/mL (ref <0.03)
Troponin I: 0.81 ng/mL (ref <0.03)
Troponin I: 0.79 ng/mL (ref <0.03)

## 2016-08-02 LAB — ECHOCARDIOGRAM COMPLETE
HEIGHTINCHES: 62 in
WEIGHTICAEL: 2523.2 [oz_av]

## 2016-08-02 LAB — PREPARE RBC (CROSSMATCH)

## 2016-08-02 NOTE — Progress Notes (Signed)
*  PRELIMINARY RESULTS* Echocardiogram 2D Echocardiogram has been performed.  Melissa Bright 08/02/2016, 9:26 AM

## 2016-08-02 NOTE — Progress Notes (Addendum)
[facility] [place] at [facility]   PATIENT NAME: Melissa Bright    MR#:  [id number]  DATE OF BIRTH:  01/01/1924  SUBJECTIVE:   Doing well. No cp or sob REVIEW OF SYSTEMS:   Review of Systems  Constitutional: Negative for chills, fever and weight loss.  HENT: Negative for ear discharge, ear pain and nosebleeds.   Eyes: Negative for blurred vision, pain and discharge.  Respiratory: Negative for sputum production, shortness of breath, wheezing and stridor.   Cardiovascular: Negative for chest pain, palpitations, orthopnea and PND.  Gastrointestinal: Negative for abdominal pain, diarrhea, nausea and vomiting.  Genitourinary: Negative for frequency and urgency.  Musculoskeletal: Negative for back pain and joint pain.  Neurological: Positive for weakness. Negative for sensory change, speech change and focal weakness.  Psychiatric/Behavioral: Negative for depression and hallucinations. The patient is not nervous/anxious.    Tolerating Diet:yes Tolerating PT: pending  DRUG ALLERGIES:   Allergies  Allergen Reactions  . Macrobid [Nitrofurantoin Monohyd Macro]     VITALS:  Blood pressure (!) 169/58, pulse 87, temperature 99.1 F (37.3 C), temperature source Oral, resp. rate 18, height 5\' 2"  (1.575 m), weight 71.5 kg (157 lb 11.2 oz), SpO2 95 %.  PHYSICAL EXAMINATION:   Physical Exam  GENERAL:  82 y.o.-year-old patient lying in the bed with no acute distress.  EYES: Pupils equal, round, reactive to light and accommodation. No scleral icterus. Extraocular muscles intact.  HEENT: Head atraumatic, normocephalic. Oropharynx and nasopharynx clear.  NECK:  Supple, no jugular venous distention. No thyroid enlargement, no tenderness.  LUNGS: Normal breath sounds bilaterally, no wheezing, rales, rhonchi. No use of accessory muscles of respiration.  CARDIOVASCULAR: S1, S2 normal. No murmurs, rubs, or gallops.  ABDOMEN: Soft, nontender, nondistended. Bowel sounds  present. No organomegaly or mass.  EXTREMITIES: No cyanosis, clubbing or edema b/l.    NEUROLOGIC: Cranial nerves II through XII are intact. No focal Motor or sensory deficits b/l.   PSYCHIATRIC:  patient is alert and oriented x 3.  SKIN: No obvious rash, lesion, or ulcer.   LABORATORY PANEL:  CBC  Recent Labs Lab 08/02/16 0526  WBC 6.2  HGB 9.5*  HCT 28.4*  PLT 265    Chemistries   Recent Labs Lab 08/01/16 1832 08/02/16 0526  NA 138 140  K 4.6 4.2  CL 108 111  CO2 23 24  GLUCOSE 101* 92  BUN 34* 33*  CREATININE 2.83* 2.60*  CALCIUM 8.7* 8.6*  AST 29  --   ALT 13*  --   ALKPHOS 75  --   BILITOT 0.4  --    Cardiac Enzymes  Recent Labs Lab 08/02/16 1648  TROPONINI 0.79*   RADIOLOGY:  Dg Chest Portable 1 View  Result Date: 08/01/2016 CLINICAL DATA:  Patient was complaining of chest pain after eating. Daughter told EMS that she was not eating, has been dizzy and light headed today. Patient went to her PCP yesterday and was told her hemoglobin was low. She had a blood transfusion approx 2 weeks ago. She is wheezing, but able to take a deep breath Hx chf, cad, htn, renal disorder, stroke, former smoker, cardiac surgery EXAM: PORTABLE CHEST 1 VIEW COMPARISON:  07/12/2016 FINDINGS: Cardiac silhouette is normal in size. No mediastinal or hilar masses. No evidence of adenopathy. Stable elevation of left hemidiaphragm. Lungs are clear.  No pleural effusion or pneumothorax. Skeletal structures are demineralized but grossly intact. Stable right axillary vascular stent. IMPRESSION: No acute cardiopulmonary disease. Electronically Signed  By: Amie Portlandavid  Ormond M.D.   On: 08/01/2016 21:01   ASSESSMENT AND PLAN:  Melissa Bright  is a 82 y.o. female who presents with Pain. Patient and family state that she had an episode of chest pain today which lasted for a brief period of time in the morning and got better, but then recurred in the afternoon and lasted a little bit longer and was  slightly more intense. She denies any significant associated shortness of breath or diaphoresis.  Acute mild NSTEMI (non-ST elevated myocardial infarction) (HCC) - we are unable to heparinize this patient given the fact that she is likely still chronically GI bleeding.  - s/p 2 unit Blood transfusion -came in with hgb of 8.0 (recent GI bleed)---2 units---10.0 -pt cp free -not a candidate for invasive testing given age and renal funciton andd recent GI bleed -medical mnx  *  Accelerated hypertension - he will use IV antihypertensives in addition to her home dose antihypertensives to get her blood pressure less than 160/100  *Chronic GI bleed - this is likely a slow chronic bleed, or an intermittent slow bleed, we are giving her a transfusion as above  * Chronic diastolic congestive heart failure (HCC) - last echocardiogram in March 2017 showed normal systolic function with grade 1 diastolic dysfunction. However, she has had significantly reduced EF in the past so we will transfuse her very slowly  * CKD (chronic kidney disease), stage IV (HCC) - stable, at baseline, avoid nephrotoxins and monitor  Case discussed with Care Management/Social Worker. Management plans discussed with the patient, family and they are in agreement.  CODE STATUS: Full  DVT Prophylaxis: lovenox  TOTAL TIME TAKING CARE OF THIS PATIENT: 30 minutes.  >50% time spent on counselling and coordination of care  POSSIBLE D/C IN 1-2 DAYS, DEPENDING ON CLINICAL CONDITION.  Note: This dictation was prepared with Dragon dictation along with smaller phrase technology. Any transcriptional errors that result from this process are unintentional.  Marolyn Urschel M.D on 08/02/2016 at 8:20 PM  Between 7am to 6pm - Pager - [phone]  After 6pm go to [email or web address] - password [email or web address] [facility]  [facility]  Office  [phone]  CC: Primary care physician; [name], [name], MD

## 2016-08-02 NOTE — Consult Note (Signed)
[facility]  CARDIOLOGY CONSULT NOTE  Patient ID: Melissa Bright MRN: [id number] DOB/AGE: 80/05/1924 82 y.o.  Admit date: 08/01/2016 Referring Physician Dr. Enedina FinnerSona Patel Primary Physician Dr. Burnadette PopLinthavong Primary Cardiologist Dr. Darrold JunkerParaschos Reason for Consultation elevated troponin/nstemi  HPI:the patient is a 82 year old female with GI bleed, hypertension, coronary artery disease who was admitted after presenting to the emergency room with complaints of chest pain.EKG showed sinus rhythm with diffuse T-wave inversions.  These changes are not appreciably changed from previous elecctrocardiogram.  She was also noted to have anemia with a hemoglobin of 8.0. Pain improved after transfusion.her serum hemoglobin today is 9.5. She does not currently have chest pain. Her blood pressure is currently treated with amlodipine, carvedilol  Hydralazine 50 mg every 8 hours, isosorbide mononitrate  at 30 mg daily. CXR revealed no acute cardiopulmonary disease.    Review of Systems  HENT: Negative.   Eyes: Negative.   Respiratory: Negative.   Cardiovascular: Positive for chest pain.  Gastrointestinal: Negative.   Genitourinary: Negative.   Musculoskeletal: Negative.   Skin: Negative.   Neurological: Positive for weakness.  Endo/Heme/Allergies: Negative.   Psychiatric/Behavioral: Negative.     Past Medical History:  Diagnosis Date  . Arthritis   . Blood transfusion without reported diagnosis   . CHF (congestive heart failure) (HCC)   . Coronary artery disease   . Hypertension   . Renal disorder   . Stroke [facility](HCC)     Family History  Problem Relation Age of Onset  . Heart attack Father     Social History   Social History  . Marital status: Widowed    Spouse name: N/A  . Number of children: N/A  . Years of education: N/A   Occupational History  . Not on file.   Social History Main Topics  . Smoking status: Former Games developermoker  . Smokeless tobacco: Never  Used  . Alcohol use No  . Drug use: No  . Sexual activity: Not on file   Other Topics Concern  . Not on file   Social History Narrative  . No narrative on file    Past Surgical History:  Procedure Laterality Date  . ABDOMINAL HYSTERECTOMY    . CARDIAC SURGERY    . EYE SURGERY    . JOINT REPLACEMENT       Prescriptions Prior to Admission  Medication Sig Dispense Refill Last Dose  . amLODipine (NORVASC) 2.5 MG tablet Take 1 tablet (2.5 mg total) by mouth 2 (two) times daily. 30 tablet 0 08/01/2016 at Unknown time  . atorvastatin (LIPITOR) 40 MG tablet Take 40 mg by mouth daily.   07/31/2016 at Unknown time  . carvedilol (COREG) 25 MG tablet Take 1 tablet (25 mg total) by mouth 2 (two) times daily. 60 tablet 5 08/01/2016 at Unknown time  . docusate sodium (COLACE) 100 MG capsule Take 100 mg by mouth 2 (two) times daily.   08/01/2016 at Unknown time  . ferrous gluconate (FERGON) 240 (27 FE) MG tablet Take 1 tablet (240 mg total) by mouth daily.   08/01/2016 at Unknown time  . folic acid (FOLVITE) 800 MCG tablet Take 800 mcg by mouth daily.   08/01/2016 at Unknown time  . hydrALAZINE (APRESOLINE) 50 MG tablet Take 1 tablet (50 mg total) by mouth every 8 (eight) hours. 90 tablet 5 08/01/2016 at Unknown time  . ipratropium-albuterol (DUONEB) 0.5-2.5 (3) MG/3ML SOLN Take 3 mLs by nebulization every 4 (four) hours as needed. 360 mL  0 prn at prn  . isosorbide mononitrate (IMDUR) 30 MG 24 hr tablet Take 30 mg by mouth daily.   08/01/2016 at Unknown time  . levothyroxine (SYNTHROID, LEVOTHROID) 112 MCG tablet Take 112 mcg by mouth daily.   08/01/2016 at Unknown time  . omega-3 acid ethyl esters (LOVAZA) 1 g capsule Take 1 g by mouth daily.   08/01/2016 at Unknown time  . pantoprazole (PROTONIX) 40 MG tablet Take 1 tablet (40 mg total) by mouth 2 (two) times daily. 60 tablet 5 08/01/2016 at Unknown time  . senna-docusate (SENOKOT-S) 8.6-50 MG tablet Take 1 tablet by mouth at bedtime as needed for mild  constipation.   08/01/2016 at Unknown time  . sodium bicarbonate 650 MG tablet Take 650 mg by mouth 2 (two) times daily.   08/01/2016 at Unknown time  . vitamin B-12 (CYANOCOBALAMIN) 1000 MCG tablet Take 1,000 mcg by mouth daily.   08/01/2016 at Unknown time    Physical Exam: Blood pressure (!) 164/65, pulse 67, temperature 98.2 F (36.8 C), temperature source Oral, resp. rate 16, height 5\' 2"  (1.575 m), weight 157 lb 11.2 oz (71.5 kg), SpO2 99 %.   Wt Readings from Last 1 Encounters:  08/01/16 157 lb 11.2 oz (71.5 kg)     General appearance: alert and cooperative Resp: clear to auscultation bilaterally Cardio: regular rate and rhythm GI: soft, non-tender; bowel sounds normal; no masses,  no organomegaly Extremities: extremities normal, atraumatic, no cyanosis or edema Neurologic: Grossly normal  Labs:   Lab Results  Component Value Date   WBC 6.2 08/02/2016   HGB 9.5 (L) 08/02/2016   HCT 28.4 (L) 08/02/2016   MCV 91.8 08/02/2016   PLT 265 08/02/2016    Recent Labs Lab 08/01/16 1832 08/02/16 0526  NA 138 140  K 4.6 4.2  CL 108 111  CO2 23 24  BUN 34* 33*  CREATININE 2.83* 2.60*  CALCIUM 8.7* 8.6*  PROT 6.3*  --   BILITOT 0.4  --   ALKPHOS 75  --   ALT 13*  --   AST 29  --   GLUCOSE 101* 92   Lab Results  Component Value Date   CKTOTAL 31 (L) 12/27/2014   CKMB 1.5 12/27/2014   TROPONINI 1.08 (HH) 08/02/2016      Radiology: No acute cardiopulmonary disease EKG: nsr with diffuse t wave inversion  ASSESSMENT AND PLAN:  Pt. Is a 82 yo female with history of hypertension, hx of gi bleed who was admitted after developing chest pain. She was noted to have anemia in the er with hgb of 8.0. She also had a mild troponin elevation which peaked at 1.21 c/w nstemi vs demand ischemia due to anemia and renal insuffiency. She is currently stable after transfusion. Discussion with patient and daughter regarding treatment options. Invasive evaluation is some what high risk due to  pt age and renal function with gfr of 15. She is also not a candidate for pci due to recent recurrent gi bleeds and not a candidate for cabg. Medical management is recommended to include current meds including carvediolol and oral long acting nitrates. Continue with atorvastatin at 40 mg daily. Medical management.  Signed: Dalia HeadingKenneth A Fath MD, [facility] 08/02/2016, 8:51 AM

## 2016-08-03 LAB — GLUCOSE, CAPILLARY
GLUCOSE-CAPILLARY: 98 mg/dL (ref 65–99)
GLUCOSE-CAPILLARY: 126 mg/dL — AB (ref 65–99)

## 2016-08-03 LAB — TYPE AND SCREEN
ABO/RH(D): B NEG
ANTIBODY SCREEN: NEGATIVE
UNIT DIVISION: 0
UNIT DIVISION: 0

## 2016-08-03 NOTE — Care Management (Signed)
Patient admitted from home with nstemi.  For discharge home and in agreement with home health nurse .  No agency preference.  On last admission an referral was made to [facility] for [facility]. The referral was canceled as patient did not feel she needed physical therapy.  Referral to [facility].

## 2016-08-03 NOTE — Discharge Summary (Signed)
[facility] - [place] at [facility]   PATIENT NAME: Melissa Bright    MR#:  [id number]  DATE OF BIRTH:  [birth year]  DATE OF ADMISSION:  08/01/2016 ADMITTING PHYSICIAN: Oralia Manis, MD  DATE OF DISCHARGE: 08/03/16  PRIMARY CARE PHYSICIAN: Marisue Ivan, MD    ADMISSION DIAGNOSIS:  Chronic blood loss anemia [D50.0] Chronic GI bleeding [K92.2] DNR (do not resuscitate) [Z66] NSTEMI (non-ST elevated myocardial infarction) (HCC) [I21.4] Stage 3 chronic kidney disease [N18.3]  DISCHARGE DIAGNOSIS:  Acute NSTEMI-medical management Acute on chronic anemia s/p 2 unit BT CKD-III SECONDARY DIAGNOSIS:   Past Medical History:  Diagnosis Date  . Arthritis   . Blood transfusion without reported diagnosis   . CHF (congestive heart failure) (HCC)   . Coronary artery disease   . Hypertension   . Renal disorder   . Stroke [facility])     HOSPITAL COURSE:   MarieFulleris a 82 y.o.femalewho presents with Pain. Patient and family state that she had an episode of chest pain today which lasted for a brief period of time in the morning and got better, but then recurred in the afternoon and lasted a little bit longer and was slightly more intense. She denies any significant associated shortness of breath or diaphoresis.  Acute mild NSTEMI (non-ST elevated myocardial infarction) (HCC) - we are unable to heparinize this patient given the fact that she is likely still chronically GI bleeding.  - s/p 2 unit Blood transfusion -came in with hgb of 8.0 (recent GI bleed)---2 units---10.0 -pt cp free -not a candidate for invasive testing given age and renal funciton andd recent GI bleed -medical mnx  *Accelerated hypertension - he will use IV antihypertensives in addition to her home dose antihypertensives to get her blood pressure less than 160/100  *ChronicGI bleed - this is likely a slow chronic bleed, or an intermittent slow bleed, we are giving her a transfusion as  above  *Chronic diastolic congestive heart failure (HCC) - last echocardiogram in March 2017 showed normal systolic function with grade 1 diastolic dysfunction. However, she has had significantly reduced EF in the past so we will transfuse her very slowly  *CKD (chronic kidney disease), stage IV (HCC) - stable, at baseline, avoid nephrotoxins and monitor  Spoke with dter and agreeable to take pt home since she is doing well. HHRN to see pt CM consulted  D/c home CONSULTS OBTAINED:  Treatment Team:  Dalia Heading, MD  DRUG ALLERGIES:   Allergies  Allergen Reactions  . Macrobid [Nitrofurantoin Monohyd Macro]     DISCHARGE MEDICATIONS:   Current Discharge Medication List    CONTINUE these medications which have NOT CHANGED   Details  amLODipine (NORVASC) 2.5 MG tablet Take 1 tablet (2.5 mg total) by mouth 2 (two) times daily. Qty: 30 tablet, Refills: 0    atorvastatin (LIPITOR) 40 MG tablet Take 40 mg by mouth daily.    carvedilol (COREG) 25 MG tablet Take 1 tablet (25 mg total) by mouth 2 (two) times daily. Qty: 60 tablet, Refills: 5    docusate sodium (COLACE) 100 MG capsule Take 100 mg by mouth 2 (two) times daily.    ferrous gluconate (FERGON) 240 (27 FE) MG tablet Take 1 tablet (240 mg total) by mouth daily.    folic acid (FOLVITE) 800 MCG tablet Take 800 mcg by mouth daily.    hydrALAZINE (APRESOLINE) 50 MG tablet Take 1 tablet (50 mg total) by mouth every 8 (eight) hours. Qty: 90 tablet, Refills: 5  ipratropium-albuterol (DUONEB) 0.5-2.5 (3) MG/3ML SOLN Take 3 mLs by nebulization every 4 (four) hours as needed. Qty: 360 mL, Refills: 0    isosorbide mononitrate (IMDUR) 30 MG 24 hr tablet Take 30 mg by mouth daily.    levothyroxine (SYNTHROID, LEVOTHROID) 112 MCG tablet Take 112 mcg by mouth daily.    omega-3 acid ethyl esters (LOVAZA) 1 g capsule Take 1 g by mouth daily.    pantoprazole (PROTONIX) 40 MG tablet Take 1 tablet (40 mg total) by mouth 2  (two) times daily. Qty: 60 tablet, Refills: 5    senna-docusate (SENOKOT-S) 8.6-50 MG tablet Take 1 tablet by mouth at bedtime as needed for mild constipation.    sodium bicarbonate 650 MG tablet Take 650 mg by mouth 2 (two) times daily.    vitamin B-12 (CYANOCOBALAMIN) 1000 MCG tablet Take 1,000 mcg by mouth daily.        If you experience worsening of your admission symptoms, develop shortness of breath, life threatening emergency, suicidal or homicidal thoughts you must seek medical attention immediately by calling 911 or calling your MD immediately  if symptoms less severe.  You Must read complete instructions/literature along with all the possible adverse reactions/side effects for all the Medicines you take and that have been prescribed to you. Take any new Medicines after you have completely understood and accept all the possible adverse reactions/side effects.   Please note  You were cared for by a hospitalist during your hospital stay. If you have any questions about your discharge medications or the care you received while you were in the hospital after you are discharged, you can call the unit and asked to speak with the hospitalist on call if the hospitalist that took care of you is not available. Once you are discharged, your primary care physician will handle any further medical issues. Please note that NO REFILLS for any discharge medications will be authorized once you are discharged, as it is imperative that you return to your primary care physician (or establish a relationship with a primary care physician if you do not have one) for your aftercare needs so that they can reassess your need for medications and monitor your lab values. Today   SUBJECTIVE   Denies any sympoms  VITAL SIGNS:  Blood pressure (!) 159/53, pulse 82, temperature 99.2 F (37.3 C), temperature source Oral, resp. rate 18, height 5\' 2"  (1.575 m), weight 71.5 kg (157 lb 11.2 oz), SpO2 96 %.  I/O:    Intake/Output Summary (Last 24 hours) at 08/03/16 1203 Last data filed at 08/03/16 0943  Gross per 24 hour  Intake              360 ml  Output                0 ml  Net              360 ml    PHYSICAL EXAMINATION:  GENERAL:  82 y.o.-year-old patient lying in the bed with no acute distress.  EYES: Pupils equal, round, reactive to light and accommodation. No scleral icterus. Extraocular muscles intact.  HEENT: Head atraumatic, normocephalic. Oropharynx and nasopharynx clear.  NECK:  Supple, no jugular venous distention. No thyroid enlargement, no tenderness.  LUNGS: Normal breath sounds bilaterally, no wheezing, rales,rhonchi or crepitation. No use of accessory muscles of respiration.  CARDIOVASCULAR: S1, S2 normal. No murmurs, rubs, or gallops.  ABDOMEN: Soft, non-tender, non-distended. Bowel sounds present. No organomegaly or mass.  EXTREMITIES: No  pedal edema, cyanosis, or clubbing.  NEUROLOGIC: Cranial nerves II through XII are intact. Muscle strength 5/5 in all extremities. Sensation intact. Gait not checked.  PSYCHIATRIC: The patient is alert and oriented x 3.  SKIN: No obvious rash, lesion, or ulcer.   DATA REVIEW:   CBC   Recent Labs Lab 08/02/16 0526  WBC 6.2  HGB 9.5*  HCT 28.4*  PLT 265    Chemistries   Recent Labs Lab 08/01/16 1832 08/02/16 0526  NA 138 140  K 4.6 4.2  CL 108 111  CO2 23 24  GLUCOSE 101* 92  BUN 34* 33*  CREATININE 2.83* 2.60*  CALCIUM 8.7* 8.6*  AST 29  --   ALT 13*  --   ALKPHOS 75  --   BILITOT 0.4  --     Microbiology Results   No results found for this or any previous visit (from the past 240 hour(s)).  RADIOLOGY:  Dg Chest Portable 1 View  Result Date: 08/01/2016 CLINICAL DATA:  Patient was complaining of chest pain after eating. Daughter told EMS that she was not eating, has been dizzy and light headed today. Patient went to her PCP yesterday and was told her hemoglobin was low. She had a blood transfusion approx 2  weeks ago. She is wheezing, but able to take a deep breath Hx chf, cad, htn, renal disorder, stroke, former smoker, cardiac surgery EXAM: PORTABLE CHEST 1 VIEW COMPARISON:  07/12/2016 FINDINGS: Cardiac silhouette is normal in size. No mediastinal or hilar masses. No evidence of adenopathy. Stable elevation of left hemidiaphragm. Lungs are clear.  No pleural effusion or pneumothorax. Skeletal structures are demineralized but grossly intact. Stable right axillary vascular stent. IMPRESSION: No acute cardiopulmonary disease. Electronically Signed   By: Amie Portlandavid  Ormond M.D.   On: 08/01/2016 21:01     Management plans discussed with the patient, family and they are in agreement.  CODE STATUS:     Code Status Orders        Start     Ordered   08/01/16 2238  Do not attempt resuscitation (DNR)  Continuous    Question Answer Comment  In the event of cardiac or respiratory ARREST Do not call a "code blue"   In the event of cardiac or respiratory ARREST Do not perform Intubation, CPR, defibrillation or ACLS   In the event of cardiac or respiratory ARREST Use medication by any route, position, wound care, and other measures to relive pain and suffering. May use oxygen, suction and manual treatment of airway obstruction as needed for comfort.      08/01/16 2237    Code Status History    Date Active Date Inactive Code Status Order ID Comments User Context   08/01/2016  9:27 PM 08/01/2016 10:37 PM DNR [id number]  Loleta Roseory Forbach, MD ED   07/12/2016  3:02 PM 07/17/2016 11:16 PM DNR [id number]  Gracelyn NurseJohn D Johnston, MD ED   07/12/2016 12:57 PM 07/12/2016  3:02 PM DNR [id number]  Willy EddyPatrick Robinson, MD ED   03/24/2016  8:08 PM 03/26/2016  7:35 PM DNR [id number]  Houston SirenVivek J Sainani, MD Inpatient   11/21/2015 11:30 AM 11/22/2015  6:31 PM DNR [id number]  Enedina FinnerSona Zachari Alberta, MD Inpatient   11/20/2015  1:12 PM 11/21/2015 11:30 AM Full Code [id number]  Katharina Caperima Vaickute, MD ED   11/09/2015  4:26 PM 11/10/2015  5:19 PM DNR [id number]  Houston SirenVivek J Sainani, MD  Inpatient    Advance Directive Documentation   Flowsheet Row Most Recent Value  Type of Advance Directive  Healthcare Power of Attorney  Pre-existing out of facility DNR order (yellow form or pink MOST form)  No data  "MOST" Form in Place?  No data      TOTAL TIME TAKING CARE OF THIS PATIENT: 40 minutes.    Hammond Obeirne M.D on 08/03/2016 at 12:03 PM  Between 7am to 6pm - Pager - [phone] After 6pm go to [email or web address] - password [email or web address] [facility]  [place] [facility]  Office  [phone]  CC: Primary care physician; Marisue Ivan, MD

## 2016-08-05 DIAGNOSIS — I130 Hypertensive heart and chronic kidney disease with heart failure and stage 1 through stage 4 chronic kidney disease, or unspecified chronic kidney disease: Secondary | ICD-10-CM | POA: Diagnosis not present

## 2016-08-05 DIAGNOSIS — I2510 Atherosclerotic heart disease of native coronary artery without angina pectoris: Secondary | ICD-10-CM | POA: Diagnosis not present

## 2016-08-05 DIAGNOSIS — N184 Chronic kidney disease, stage 4 (severe): Secondary | ICD-10-CM | POA: Diagnosis not present

## 2016-08-05 DIAGNOSIS — I214 Non-ST elevation (NSTEMI) myocardial infarction: Secondary | ICD-10-CM | POA: Diagnosis not present

## 2016-08-05 DIAGNOSIS — D500 Iron deficiency anemia secondary to blood loss (chronic): Secondary | ICD-10-CM | POA: Diagnosis not present

## 2016-08-05 DIAGNOSIS — I5032 Chronic diastolic (congestive) heart failure: Secondary | ICD-10-CM | POA: Diagnosis not present

## 2016-08-05 DIAGNOSIS — K922 Gastrointestinal hemorrhage, unspecified: Secondary | ICD-10-CM | POA: Diagnosis not present

## 2016-08-06 ENCOUNTER — Inpatient Hospital Stay
Admission: EM | Admit: 2016-08-06 | Discharge: 2016-08-12 | DRG: 280 | Disposition: A | Discharge status: Hospice | Payer: Commercial Managed Care - HMO | Attending: Internal Medicine | Admitting: Internal Medicine

## 2016-08-06 ENCOUNTER — Encounter: Payer: Self-pay | Admitting: Emergency Medicine

## 2016-08-06 ENCOUNTER — Emergency Department: Payer: Commercial Managed Care - HMO

## 2016-08-06 DIAGNOSIS — Z66 Do not resuscitate: Secondary | ICD-10-CM | POA: Diagnosis not present

## 2016-08-06 DIAGNOSIS — I2510 Atherosclerotic heart disease of native coronary artery without angina pectoris: Secondary | ICD-10-CM | POA: Diagnosis present

## 2016-08-06 DIAGNOSIS — J9600 Acute respiratory failure, unspecified whether with hypoxia or hypercapnia: Secondary | ICD-10-CM | POA: Diagnosis not present

## 2016-08-06 DIAGNOSIS — Z87891 Personal history of nicotine dependence: Secondary | ICD-10-CM | POA: Diagnosis not present

## 2016-08-06 DIAGNOSIS — I5021 Acute systolic (congestive) heart failure: Secondary | ICD-10-CM | POA: Diagnosis present

## 2016-08-06 DIAGNOSIS — J189 Pneumonia, unspecified organism: Secondary | ICD-10-CM | POA: Diagnosis present

## 2016-08-06 DIAGNOSIS — I130 Hypertensive heart and chronic kidney disease with heart failure and stage 1 through stage 4 chronic kidney disease, or unspecified chronic kidney disease: Principal | ICD-10-CM | POA: Diagnosis present

## 2016-08-06 DIAGNOSIS — K921 Melena: Secondary | ICD-10-CM | POA: Diagnosis not present

## 2016-08-06 DIAGNOSIS — Z8673 Personal history of transient ischemic attack (TIA), and cerebral infarction without residual deficits: Secondary | ICD-10-CM | POA: Diagnosis not present

## 2016-08-06 DIAGNOSIS — R739 Hyperglycemia, unspecified: Secondary | ICD-10-CM | POA: Diagnosis present

## 2016-08-06 DIAGNOSIS — N390 Urinary tract infection, site not specified: Secondary | ICD-10-CM | POA: Diagnosis present

## 2016-08-06 DIAGNOSIS — K922 Gastrointestinal hemorrhage, unspecified: Secondary | ICD-10-CM | POA: Diagnosis not present

## 2016-08-06 DIAGNOSIS — J9601 Acute respiratory failure with hypoxia: Secondary | ICD-10-CM

## 2016-08-06 DIAGNOSIS — Z8249 Family history of ischemic heart disease and other diseases of the circulatory system: Secondary | ICD-10-CM | POA: Diagnosis not present

## 2016-08-06 DIAGNOSIS — E039 Hypothyroidism, unspecified: Secondary | ICD-10-CM | POA: Diagnosis present

## 2016-08-06 DIAGNOSIS — Z515 Encounter for palliative care: Secondary | ICD-10-CM | POA: Diagnosis not present

## 2016-08-06 DIAGNOSIS — J810 Acute pulmonary edema: Secondary | ICD-10-CM

## 2016-08-06 DIAGNOSIS — I214 Non-ST elevation (NSTEMI) myocardial infarction: Secondary | ICD-10-CM | POA: Diagnosis not present

## 2016-08-06 DIAGNOSIS — Y95 Nosocomial condition: Secondary | ICD-10-CM | POA: Diagnosis present

## 2016-08-06 DIAGNOSIS — I10 Essential (primary) hypertension: Secondary | ICD-10-CM | POA: Diagnosis not present

## 2016-08-06 DIAGNOSIS — Z79899 Other long term (current) drug therapy: Secondary | ICD-10-CM

## 2016-08-06 DIAGNOSIS — N184 Chronic kidney disease, stage 4 (severe): Secondary | ICD-10-CM | POA: Diagnosis present

## 2016-08-06 DIAGNOSIS — J9690 Respiratory failure, unspecified, unspecified whether with hypoxia or hypercapnia: Secondary | ICD-10-CM | POA: Diagnosis not present

## 2016-08-06 DIAGNOSIS — J181 Lobar pneumonia, unspecified organism: Secondary | ICD-10-CM | POA: Diagnosis not present

## 2016-08-06 DIAGNOSIS — J9602 Acute respiratory failure with hypercapnia: Secondary | ICD-10-CM | POA: Diagnosis not present

## 2016-08-06 DIAGNOSIS — D520 Dietary folate deficiency anemia: Secondary | ICD-10-CM | POA: Diagnosis not present

## 2016-08-06 DIAGNOSIS — E43 Unspecified severe protein-calorie malnutrition: Secondary | ICD-10-CM | POA: Diagnosis not present

## 2016-08-06 DIAGNOSIS — M1990 Unspecified osteoarthritis, unspecified site: Secondary | ICD-10-CM | POA: Diagnosis present

## 2016-08-06 DIAGNOSIS — R0602 Shortness of breath: Secondary | ICD-10-CM | POA: Diagnosis not present

## 2016-08-06 DIAGNOSIS — R0600 Dyspnea, unspecified: Secondary | ICD-10-CM | POA: Diagnosis not present

## 2016-08-06 DIAGNOSIS — Z743 Need for continuous supervision: Secondary | ICD-10-CM | POA: Diagnosis not present

## 2016-08-06 DIAGNOSIS — I509 Heart failure, unspecified: Secondary | ICD-10-CM | POA: Diagnosis not present

## 2016-08-06 DIAGNOSIS — K219 Gastro-esophageal reflux disease without esophagitis: Secondary | ICD-10-CM | POA: Diagnosis present

## 2016-08-06 LAB — URINALYSIS, COMPLETE (UACMP) WITH MICROSCOPIC
BILIRUBIN URINE: NEGATIVE
Glucose, UA: 50 mg/dL — AB
HGB URINE DIPSTICK: NEGATIVE
KETONES UR: NEGATIVE mg/dL
Nitrite: NEGATIVE
SQUAMOUS EPITHELIAL / LPF: NONE SEEN
Specific Gravity, Urine: 1.019 (ref 1.005–1.030)
pH: 6 (ref 5.0–8.0)

## 2016-08-06 LAB — COMPREHENSIVE METABOLIC PANEL
ALT: 11 U/L — AB (ref 14–54)
AST: 23 U/L (ref 15–41)
Albumin: 2.7 g/dL — ABNORMAL LOW (ref 3.5–5.0)
Alkaline Phosphatase: 81 U/L (ref 38–126)
Anion gap: 9 (ref 5–15)
BUN: 34 mg/dL — AB (ref 6–20)
CHLORIDE: 111 mmol/L (ref 101–111)
CO2: 22 mmol/L (ref 22–32)
CREATININE: 2.3 mg/dL — AB (ref 0.44–1.00)
Calcium: 8.9 mg/dL (ref 8.9–10.3)
GFR calc non Af Amer: 18 mL/min — ABNORMAL LOW (ref >60)
GFR, EST AFRICAN AMERICAN: 20 mL/min — AB (ref >60)
Glucose, Bld: 201 mg/dL — ABNORMAL HIGH (ref 65–99)
POTASSIUM: 4.1 mmol/L (ref 3.5–5.1)
SODIUM: 142 mmol/L (ref 135–145)
Total Bilirubin: 0.3 mg/dL (ref 0.3–1.2)
Total Protein: 7 g/dL (ref 6.5–8.1)

## 2016-08-06 LAB — CBC
HCT: 34.3 % — ABNORMAL LOW (ref 35.0–47.0)
Hemoglobin: 11.3 g/dL — ABNORMAL LOW (ref 12.0–16.0)
MCH: 30.8 pg (ref 26.0–34.0)
MCHC: 32.9 g/dL (ref 32.0–36.0)
MCV: 93.8 fL (ref 80.0–100.0)
PLATELETS: 235 10*3/uL (ref 150–440)
RBC: 3.66 MIL/uL — AB (ref 3.80–5.20)
RDW: 16.9 % — ABNORMAL HIGH (ref 11.5–14.5)
WBC: 5.3 10*3/uL (ref 3.6–11.0)

## 2016-08-06 LAB — CBC WITH DIFFERENTIAL/PLATELET
BASOS ABS: 0 10*3/uL (ref 0–0.1)
Basophils Relative: 1 %
EOS PCT: 2 %
Eosinophils Absolute: 0.2 10*3/uL (ref 0–0.7)
HEMATOCRIT: 37.6 % (ref 35.0–47.0)
HEMOGLOBIN: 12.5 g/dL (ref 12.0–16.0)
LYMPHS ABS: 1.4 10*3/uL (ref 1.0–3.6)
LYMPHS PCT: 16 %
MCH: 30.6 pg (ref 26.0–34.0)
MCHC: 33.2 g/dL (ref 32.0–36.0)
MCV: 92.1 fL (ref 80.0–100.0)
Monocytes Absolute: 0.5 10*3/uL (ref 0.2–0.9)
Monocytes Relative: 6 %
NEUTROS ABS: 6.6 10*3/uL — AB (ref 1.4–6.5)
Neutrophils Relative %: 75 %
PLATELETS: 275 10*3/uL (ref 150–440)
RBC: 4.08 MIL/uL (ref 3.80–5.20)
RDW: 16.9 % — ABNORMAL HIGH (ref 11.5–14.5)
WBC: 8.8 10*3/uL (ref 3.6–11.0)

## 2016-08-06 LAB — TROPONIN I
Troponin I: 0.08 ng/mL (ref <0.03)
Troponin I: 0.17 ng/mL (ref <0.03)
Troponin I: 0.13 ng/mL (ref <0.03)

## 2016-08-06 LAB — GLUCOSE, CAPILLARY
GLUCOSE-CAPILLARY: 115 mg/dL — AB (ref 65–99)
GLUCOSE-CAPILLARY: 72 mg/dL (ref 65–99)
GLUCOSE-CAPILLARY: 97 mg/dL (ref 65–99)
Glucose-Capillary: 87 mg/dL (ref 65–99)

## 2016-08-06 LAB — MRSA PCR SCREENING: MRSA BY PCR: NEGATIVE

## 2016-08-06 LAB — LACTIC ACID, PLASMA: LACTIC ACID, VENOUS: 1 mmol/L (ref 0.5–1.9)

## 2016-08-06 LAB — PROCALCITONIN: PROCALCITONIN: 0.32 ng/mL

## 2016-08-06 MED ORDER — ASPIRIN 81 MG PO CHEW
324.0000 mg | CHEWABLE_TABLET | ORAL | Status: DC
  Filled 2016-08-06 (×2): qty 4

## 2016-08-06 MED ORDER — DOCUSATE SODIUM 100 MG PO CAPS
100.0000 mg | ORAL_CAPSULE | Freq: Two times a day (BID) | ORAL | Status: DC
  Administered 2016-08-06 – 2016-08-12 (×13): 100 mg via ORAL
  Filled 2016-08-06 (×14): qty 1

## 2016-08-06 MED ORDER — INSULIN ASPART 100 UNIT/ML ~~LOC~~ SOLN
0.0000 [IU] | SUBCUTANEOUS | Status: DC
  Administered 2016-08-06: 1 [IU] via SUBCUTANEOUS
  Filled 2016-08-06: qty 1

## 2016-08-06 MED ORDER — HEPARIN SODIUM (PORCINE) 5000 UNIT/ML IJ SOLN: 5000.0000 [IU] | Freq: Three times a day (TID) | INTRAMUSCULAR | Status: DC

## 2016-08-06 MED ORDER — VANCOMYCIN HCL IN DEXTROSE 1-5 GM/200ML-% IV SOLN
1000.0000 mg | INTRAVENOUS | Status: DC
  Filled 2016-08-06: qty 200

## 2016-08-06 MED ORDER — CHLORHEXIDINE GLUCONATE 0.12 % MT SOLN
15.0000 mL | Freq: Two times a day (BID) | OROMUCOSAL | Status: DC
  Administered 2016-08-06 – 2016-08-11 (×10): 15 mL via OROMUCOSAL
  Filled 2016-08-06 (×10): qty 15

## 2016-08-06 MED ORDER — AMLODIPINE BESYLATE 5 MG PO TABS
2.5000 mg | ORAL_TABLET | Freq: Two times a day (BID) | ORAL | Status: DC
  Administered 2016-08-06 – 2016-08-08 (×6): 2.5 mg via ORAL
  Administered 2016-08-09: 5 mg via ORAL
  Administered 2016-08-09 – 2016-08-12 (×6): 2.5 mg via ORAL
  Filled 2016-08-06 (×14): qty 1

## 2016-08-06 MED ORDER — SODIUM CHLORIDE 0.9 % IV SOLN: 250.0000 mL | INTRAVENOUS | Status: DC | PRN

## 2016-08-06 MED ORDER — ASPIRIN 300 MG RE SUPP: 300.0000 mg | RECTAL | Status: DC

## 2016-08-06 MED ORDER — ATORVASTATIN CALCIUM 20 MG PO TABS
40.0000 mg | ORAL_TABLET | Freq: Every day | ORAL | Status: DC
  Administered 2016-08-06 – 2016-08-11 (×6): 40 mg via ORAL
  Filled 2016-08-06 (×6): qty 2

## 2016-08-06 MED ORDER — ONDANSETRON HCL 4 MG/2ML IJ SOLN: 4.0000 mg | Freq: Four times a day (QID) | INTRAMUSCULAR | Status: DC | PRN

## 2016-08-06 MED ORDER — ORAL CARE MOUTH RINSE
15.0000 mL | Freq: Two times a day (BID) | OROMUCOSAL | Status: DC
  Administered 2016-08-06 – 2016-08-11 (×10): 15 mL via OROMUCOSAL

## 2016-08-06 MED ORDER — CEFEPIME-DEXTROSE 2 GM/50ML IV SOLR
2.0000 g | Freq: Once | INTRAVENOUS | Status: AC
  Administered 2016-08-06: 2 g via INTRAVENOUS
  Filled 2016-08-06: qty 50

## 2016-08-06 MED ORDER — LEVOTHYROXINE SODIUM 112 MCG PO TABS
112.0000 ug | ORAL_TABLET | Freq: Every day | ORAL | Status: DC
  Administered 2016-08-07 – 2016-08-11 (×5): 112 ug via ORAL
  Filled 2016-08-06 (×6): qty 1

## 2016-08-06 MED ORDER — VANCOMYCIN HCL IN DEXTROSE 1-5 GM/200ML-% IV SOLN
1000.0000 mg | INTRAVENOUS | Status: AC
  Administered 2016-08-06: 1000 mg via INTRAVENOUS
  Filled 2016-08-06: qty 200

## 2016-08-06 MED ORDER — ALBUTEROL SULFATE (2.5 MG/3ML) 0.083% IN NEBU
INHALATION_SOLUTION | RESPIRATORY_TRACT | Status: AC
  Administered 2016-08-06: 5 mg via RESPIRATORY_TRACT
  Filled 2016-08-06: qty 6

## 2016-08-06 MED ORDER — ACETAMINOPHEN 325 MG PO TABS
650.0000 mg | ORAL_TABLET | ORAL | Status: DC | PRN
  Administered 2016-08-06 – 2016-08-11 (×2): 650 mg via ORAL
  Filled 2016-08-06 (×2): qty 2

## 2016-08-06 MED ORDER — PANTOPRAZOLE SODIUM 40 MG PO TBEC
40.0000 mg | DELAYED_RELEASE_TABLET | Freq: Two times a day (BID) | ORAL | Status: DC
  Administered 2016-08-06 – 2016-08-12 (×13): 40 mg via ORAL
  Filled 2016-08-06 (×13): qty 1

## 2016-08-06 MED ORDER — NITROGLYCERIN IN D5W 200-5 MCG/ML-% IV SOLN
0.0000 ug/min | Freq: Once | INTRAVENOUS | Status: AC
  Administered 2016-08-06: 50 ug/min via INTRAVENOUS

## 2016-08-06 MED ORDER — HYDRALAZINE HCL 20 MG/ML IJ SOLN
10.0000 mg | INTRAMUSCULAR | Status: DC | PRN
  Administered 2016-08-07 (×2): 10 mg via INTRAVENOUS
  Filled 2016-08-06 (×3): qty 1

## 2016-08-06 MED ORDER — ALBUTEROL SULFATE (2.5 MG/3ML) 0.083% IN NEBU
5.0000 mg | INHALATION_SOLUTION | Freq: Once | RESPIRATORY_TRACT | Status: AC
  Administered 2016-08-06: 5 mg via RESPIRATORY_TRACT

## 2016-08-06 MED ORDER — IPRATROPIUM-ALBUTEROL 0.5-2.5 (3) MG/3ML IN SOLN
3.0000 mL | Freq: Four times a day (QID) | RESPIRATORY_TRACT | Status: DC | PRN
  Administered 2016-08-07 – 2016-08-08 (×2): 3 mL via RESPIRATORY_TRACT
  Filled 2016-08-06 (×3): qty 3

## 2016-08-06 MED ORDER — ISOSORBIDE MONONITRATE ER 30 MG PO TB24
30.0000 mg | ORAL_TABLET | Freq: Every day | ORAL | Status: DC
  Administered 2016-08-06 – 2016-08-12 (×7): 30 mg via ORAL
  Filled 2016-08-06 (×7): qty 1

## 2016-08-06 MED ORDER — DEXTROSE 5 % IV SOLN
1.0000 g | INTRAVENOUS | Status: DC
  Administered 2016-08-07: 1 g via INTRAVENOUS
  Filled 2016-08-06: qty 1

## 2016-08-06 MED ORDER — NITROGLYCERIN IN D5W 200-5 MCG/ML-% IV SOLN
INTRAVENOUS | Status: AC
  Administered 2016-08-06: 50 ug/min via INTRAVENOUS
  Filled 2016-08-06: qty 250

## 2016-08-06 NOTE — H&P (Signed)
PULMONARY / CRITICAL CARE MEDICINE   Name: Melissa Bright MRN: [id number] DOB: 1923-09-13    ADMISSION DATE:  08/06/2016 CONSULTATION DATE:  08/06/2016  REFERRING MD:  Dr. Roxan Hockey  CHIEF COMPLAINT:  Shortness of Breath  HISTORY OF PRESENT ILLNESS:   This is a 82 yo female with a PMH of CVA, Renal Disorder, Dietary folate deficiency anemia (baseline Hgb 7.7), CAD, Arthritis, GERD, CKD stage IV, Chronic diastolic CHF, and Recent hospitalization 11/12-11/17 w/ acute anemia secondary to GI bleed and STEMI (too high risk for cardiac catheterization).  She presented to [facility] ER 12/7 with c/o shortness of breath with hypoxia onset 12/7.  According to pts daughter the pt was in her usual state of health until this morning when she developed severe shortness of breath, therefore EMS was notified.  Upon EMS arrival she was hypoxic and drowsy, but able to answer yes and no questions she was subsequently placed on Bipap.  She was also hypertensive, therefore a nitropaste was applied.  She remained on Bipap upon arrival to ER.  PCCM contacted to admit pt to ICU for acute hypoxic respiratory failure secondary to HCAP and Pulmonary Edema  PAST MEDICAL HISTORY :  She  has a past medical history of Arthritis; Blood transfusion without reported diagnosis; CHF (congestive heart failure) (HCC); Coronary artery disease; Hypertension; Renal disorder; and Stroke (HCC).  PAST SURGICAL HISTORY: She  has a past surgical history that includes Joint replacement; Abdominal hysterectomy; Cardiac surgery; and Eye surgery.  Allergies  Allergen Reactions  . Macrobid [Nitrofurantoin Monohyd Macro]     No current facility-administered medications on file prior to encounter.    Current Outpatient Prescriptions on File Prior to Encounter  Medication Sig  . amLODipine (NORVASC) 2.5 MG tablet Take 1 tablet (2.5 mg total) by mouth 2 (two) times daily.  Marland Kitchen atorvastatin (LIPITOR) 40 MG tablet Take 40 mg by mouth daily.  .  carvedilol (COREG) 25 MG tablet Take 1 tablet (25 mg total) by mouth 2 (two) times daily.  Marland Kitchen docusate sodium (COLACE) 100 MG capsule Take 100 mg by mouth 2 (two) times daily.  . ferrous gluconate (FERGON) 240 (27 FE) MG tablet Take 1 tablet (240 mg total) by mouth daily.  . folic acid (FOLVITE) 800 MCG tablet Take 400 mcg by mouth 2 (two) times daily as needed.   . hydrALAZINE (APRESOLINE) 50 MG tablet Take 1 tablet (50 mg total) by mouth every 8 (eight) hours.  Marland Kitchen ipratropium-albuterol (DUONEB) 0.5-2.5 (3) MG/3ML SOLN Take 3 mLs by nebulization every 4 (four) hours as needed.  . isosorbide mononitrate (IMDUR) 30 MG 24 hr tablet Take 30 mg by mouth daily.  Marland Kitchen levothyroxine (SYNTHROID, LEVOTHROID) 112 MCG tablet Take 112 mcg by mouth daily.  Marland Kitchen omega-3 acid ethyl esters (LOVAZA) 1 g capsule Take 1 g by mouth daily.  . pantoprazole (PROTONIX) 40 MG tablet Take 1 tablet (40 mg total) by mouth 2 (two) times daily.  Marland Kitchen senna-docusate (SENOKOT-S) 8.6-50 MG tablet Take 1 tablet by mouth at bedtime as needed for mild constipation.  . sodium bicarbonate 650 MG tablet Take 650 mg by mouth 2 (two) times daily.  . vitamin B-12 (CYANOCOBALAMIN) 1000 MCG tablet Take 1,000 mcg by mouth daily.    FAMILY HISTORY:  Her indicated that her mother is deceased. She indicated that her father is deceased.    SOCIAL HISTORY: She  reports that she has quit smoking. She has never used smokeless tobacco. She reports that she does not drink alcohol  or use drugs.  REVIEW OF SYSTEMS:  Positives in BOLD Gen: Denies fever, chills, weight change, fatigue, night sweats HEENT: Denies blurred vision, double vision, hearing loss, tinnitus, sinus congestion, rhinorrhea, sore throat, neck stiffness, dysphagia PULM: shortness of breath, cough, sputum production, hemoptysis, wheezing CV: Denies chest pain, edema, orthopnea, paroxysmal nocturnal dyspnea, palpitations GI: Denies abdominal pain, nausea, vomiting, diarrhea, hematochezia,  melena, constipation, change in bowel habits GU: Denies dysuria, hematuria, polyuria, oliguria, urethral discharge Endocrine: Denies hot or cold intolerance, polyuria, polyphagia or appetite change Derm: Denies rash, dry skin, scaling or peeling skin change Heme: Denies easy bruising, bleeding, bleeding gums Neuro: Denies headache, numbness, weakness, slurred speech, loss of memory or consciousness  SUBJECTIVE:  No current complaints at this time resting comfortably in Bipap.  VITAL SIGNS: BP (!) 152/83   Pulse 67   Temp 98 F (36.7 C) (Rectal)   Resp 17   Ht 5\' 7"  (1.702 m)   Wt 173 lb 1 oz (78.5 kg)   SpO2 100%   BMI 27.11 kg/m   HEMODYNAMICS:    VENTILATOR SETTINGS:    INTAKE / OUTPUT: No intake/output data recorded.  PHYSICAL EXAMINATION: General:  Acutely ill appearing obese Caucasian female, well developed  Neuro:  Alert and oriented, follows commands, PERRLA  HEENT:  Supple, no JVD Cardiovascular:  NSR, s1s2, no M/R/G Lungs: course and diminished throughout, even, non labored on Bipap Abdomen:  +BS x4, obese, soft, non tender, non distended Musculoskeletal:  2+ non pitting bilateral lower extremity edema, moves all extremities Skin:  Intact no rashes or lesions  LABS:  BMET  Recent Labs Lab 08/01/16 1832 08/02/16 0526 08/06/16 0851  NA 138 140 142  K 4.6 4.2 4.1  CL 108 111 111  CO2 23 24 22   BUN 34* 33* 34*  CREATININE 2.83* 2.60* 2.30*  GLUCOSE 101* 92 201*    Electrolytes  Recent Labs Lab 08/01/16 1832 08/02/16 0526 08/06/16 0851  CALCIUM 8.7* 8.6* 8.9    CBC  Recent Labs Lab 08/01/16 1832 08/02/16 0526 08/06/16 0851  WBC 5.7 6.2 8.8  HGB 8.0* 9.5* 12.5  HCT 24.1* 28.4* 37.6  PLT 321 265 275    Coag's No results for input(s): APTT, INR in the last 168 hours.  Sepsis Markers  Recent Labs Lab 08/06/16 0851  LATICACIDVEN 1.0    ABG No results for input(s): PHART, PCO2ART, PO2ART in the last 168 hours.  Liver  Enzymes  Recent Labs Lab 08/01/16 1832 08/06/16 0851  AST 29 23  ALT 13* 11*  ALKPHOS 75 81  BILITOT 0.4 0.3  ALBUMIN 2.5* 2.7*    Cardiac Enzymes  Recent Labs Lab 08/02/16 1107 08/02/16 1648 08/06/16 0851  TROPONINI 0.81* 0.79* 0.08*    Glucose  Recent Labs Lab 08/03/16 0817 08/03/16 1215  GLUCAP 98 126*    Imaging Dg Chest Portable 1 View  Result Date: 08/06/2016 CLINICAL DATA:  82 year old presenting with acute onset of respiratory distress. Recent hospitalization for NSTEMI. EXAM: PORTABLE CHEST 1 VIEW COMPARISON:  08/01/2016, 07/12/2016 and earlier. FINDINGS: Cardiac silhouette normal in size, unchanged. LAD and right coronary artery stents. Thoracic aorta atherosclerotic, unchanged. Hilar and mediastinal contours otherwise unremarkable. Mild patchy airspace opacities involving the right upper lobe, new since the examination 5 days ago. Lungs otherwise clear. Pulmonary vascularity normal. Stable chronic mild elevation of the left hemidiaphragm. Azygos fissure again noted. IMPRESSION: Early/mild acute right upper lobe pneumonia. Electronically Signed   By: Hulan Saashomas  Lawrence M.D.   On: 08/06/2016 09:12  STUDIES:  Echo 12/3>>EF 45%  CULTURES: Blood x2 12/7>> Urine 12/7>>  ANTIBIOTICS: Vancomycin 12/7>> Cefepime 12/7>>  SIGNIFICANT EVENTS: 12[facility]7-[facility] due to acute hypoxic respiratory failure secondary to HCAP and Pulmonary Edema  LINES/TUBES: PIV's 12/7>>  ASSESSMENT / PLAN:  PULMONARY A: Acute hypoxic respiratory failure secondary to HCAP and Pulmonary Edema P:   Continuous Bipap for now wean as tolerated Maintain O2 sats >92% Prn Bronchodilators CXR in am Prn ABG's  CARDIOVASCULAR A:  Hypertension Hx: CVA, CAD, and Chronic diastolic CHF P:  Continue outpatient Norvasc, Atorvastatin, Imdur, and Aspirin Prn Hydralazine for systolic >160 or diastolic >100 Continuous telemetry Trend troponin's  RENAL A:   Acute on  chronic renal failure  CKD stage IV P:   Trend BMP's Replace electrolytes as indicated Monitor UOP Avoid nephrotoxic drugs  GASTROINTESTINAL A:   No acute issues  Hx: GERD P:   Keep NPO for now until weaned off Bipap Protonix for GERD  HEMATOLOGIC A:   No acute issues Hx: GI bleed with acute anemia and Dietary folate deficiency anemia (baseline Hgb 7.7) P:  SCD's for VTE prophylaxis Trend CBC's Monitor for s/sx of bleeding Transfuse for hgb <7  INFECTIOUS A:   HCAP P:   Trend WBC's and monitor fever curve Trend PCT's and lactic acid Continue abx as above Follow cultures   ENDOCRINE A:   Hyperglycemia  P:   SSI  CBG's q4hrs   NEUROLOGIC A:   No acute issues  Hx: Arthritis  P:   Avoid sedating medications Prn Tylenol for pain management Promote family presence at bedside   FAMILY  - Updates: Pts daughters updated about plan of care and questions answered will continue to monitor and assess pt 12/7  - Inter-disciplinary family meet or Palliative Care meeting due by: 08/13/2016   Sonda Rumbleana Blakeney, AGNP  Pulmonary/Critical Care Pager [phone] (please enter 7 digits) PCCM Consult Pager [phone] (please enter 7 digits)  Pt seen and examined with NP, I agree with assessment and plan as amended above.  [facility]  [facility] to acute hypoxic respiratory failure secondary to HCAP and Pulmonary Edema. Continue abx, diuresis,  wean off bipap.   [name]-Deep Sephora Boyar, M.D.  08/07/2016  Critical Care Attestation.  I have personally obtained a history, examined the patient, evaluated laboratory and imaging results, formulated the assessment and plan and placed orders. The Patient requires high complexity decision making for assessment and support, frequent evaluation and titration of therapies, application of advanced monitoring technologies and extensive interpretation of multiple databases. The patient has critical illness that could lead  imminently to failure of 1 or more organ systems and requires the highest level of physician preparedness to intervene.  Critical Care Time devoted to patient care services described in this note is 45 minutes and is exclusive of time spent in procedures.

## 2016-08-06 NOTE — Progress Notes (Signed)
ADVANCED CARE PLANNING NOTE:  Discussed case with pt and pt's caregivers (daughters) at bedside.  I explained the patient has experienced a progressive downward course over the past few months. She is now experiencing her 3 recurrent admission. I explained to the family that this if likely an indicator that she is reaching the end of her life.  They are in agreement that she would not want to be put on a ventilator or other artificial life sustaining treatments.   I brought up the idea of discontinuing bipap and treating with abx with comfort measures only if she worsens. However they would like to discuss this with a remaining sister who is not present at this time. Will continue current measures.   Code status will be changed to DNR.     [name]-Deep Neveah Bang, M.D.  Time spent in discussion 30 min.  08/06/2016

## 2016-08-06 NOTE — ED Provider Notes (Signed)
[facility] Emergency Department Provider Note    First MD Initiated Contact with Patient 08/06/16 [phone]     (approximate)  I have reviewed the triage vital signs and the nursing notes.   HISTORY  Chief Complaint Respiratory Distress  Level V Caveat:  Acute respiratory distress  HPI Melissa Bright is a 82 y.o. female with recent admission to the hospital for an STEMI, congestive heart failure exacerbation and chronic GI bleeding presents with acute respiratory distress with hypoxia. EMS was called out for respiratory distress found her hypoxic. She is placed on BiPAP and given nitroglycerin paste for hypertension. Patient drowsy but will respond to questions by shaking had yes or no. She arrives to On BiPAP. Denies any pain at this time. Does have shortness of breath. No reported fevers.   Past Medical History:  Diagnosis Date  . Arthritis   . Blood transfusion without reported diagnosis   . CHF (congestive heart failure) ([place])   . Coronary artery disease   . Hypertension   . Renal disorder   . Stroke [facility])    Family History  Problem Relation Age of Onset  . Heart attack Father    Past Surgical History:  Procedure Laterality Date  . ABDOMINAL HYSTERECTOMY    . CARDIAC SURGERY    . EYE SURGERY    . JOINT REPLACEMENT     Patient Active Problem List   Diagnosis Date Noted  . NSTEMI (non-ST elevated myocardial infarction) ([place]) 08/01/2016  . GI bleed 07/12/2016  . Acute posthemorrhagic anemia 03/26/2016  . GIB (gastrointestinal bleeding) 03/26/2016  . Acute on chronic renal failure ([place]) 03/26/2016  . Pyuria 03/26/2016  . Essential hypertension, malignant 03/26/2016  . Dementia with behavioral disturbance 03/26/2016  . Symptomatic anemia 03/24/2016  . Chronic diastolic congestive heart failure ([place])   . Respiratory failure with hypoxia ([place]) 11/20/2015  . COPD (chronic obstructive pulmonary disease) ([place]) 11/20/2015  . Left lower lobe  pneumonia ([place]) 11/20/2015  . Leukocytosis 11/20/2015  . Malignant essential hypertension 11/20/2015  . CKD (chronic kidney disease), stage IV ([place]) 11/20/2015  . Anemia of chronic disease 11/20/2015  . Acute respiratory failure with hypoxia ([place]) 11/20/2015  . Accelerated hypertension 11/09/2015      Prior to Admission medications   Medication Sig Start Date End Date Taking? Authorizing Provider  amLODipine (NORVASC) 2.5 MG tablet Take 1 tablet (2.5 mg total) by mouth 2 (two) times daily. 07/17/16   Epifanio Lesches, MD  atorvastatin (LIPITOR) 40 MG tablet Take 40 mg by mouth daily.    Historical Provider, MD  carvedilol (COREG) 25 MG tablet Take 1 tablet (25 mg total) by mouth 2 (two) times daily. 03/26/16   Theodoro Grist, MD  docusate sodium (COLACE) 100 MG capsule Take 100 mg by mouth 2 (two) times daily.    Historical Provider, MD  ferrous gluconate (FERGON) 240 (27 FE) MG tablet Take 1 tablet (240 mg total) by mouth daily. 11/22/15   Colleen Can, MD  folic acid (FOLVITE) 881 MCG tablet Take 800 mcg by mouth daily.    Historical Provider, MD  hydrALAZINE (APRESOLINE) 50 MG tablet Take 1 tablet (50 mg total) by mouth every 8 (eight) hours. 03/26/16   Theodoro Grist, MD  ipratropium-albuterol (DUONEB) 0.5-2.5 (3) MG/3ML SOLN Take 3 mLs by nebulization every 4 (four) hours as needed. 11/22/15   Colleen Can, MD  isosorbide mononitrate (IMDUR) 30 MG 24 hr tablet Take 30 mg by mouth daily.  Historical Provider, MD  levothyroxine (SYNTHROID, LEVOTHROID) 112 MCG tablet Take 112 mcg by mouth daily.    Historical Provider, MD  omega-3 acid ethyl esters (LOVAZA) 1 g capsule Take 1 g by mouth daily.    Historical Provider, MD  pantoprazole (PROTONIX) 40 MG tablet Take 1 tablet (40 mg total) by mouth 2 (two) times daily. 03/26/16   Theodoro Grist, MD  senna-docusate (SENOKOT-S) 8.6-50 MG tablet Take 1 tablet by mouth at bedtime as needed for mild constipation.    Historical Provider,  MD  sodium bicarbonate 650 MG tablet Take 650 mg by mouth 2 (two) times daily.    Historical Provider, MD  vitamin B-12 (CYANOCOBALAMIN) 1000 MCG tablet Take 1,000 mcg by mouth daily.    Historical Provider, MD    Allergies Macrobid [nitrofurantoin monohyd macro]    Social History Social History  Substance Use Topics  . Smoking status: Former Research scientist (life sciences)  . Smokeless tobacco: Never Used  . Alcohol use No    Review of Systems Patient denies headaches, rhinorrhea, blurry vision, numbness, shortness of breath, chest pain, edema, cough, abdominal pain, nausea, vomiting, diarrhea, dysuria, fevers, rashes or hallucinations unless otherwise stated above in HPI. ____________________________________________   PHYSICAL EXAM:  VITAL SIGNS: Vitals:   08/06/16 0854  BP: (!) 154/102  Pulse: 94  Resp: (!) 26    Constitutional: Critically ill with severe respiratory distress Eyes: Conjunctivae are normal. PERRL. EOMI. Head: Atraumatic. Nose: No congestion/rhinnorhea. Mouth/Throat: In place Neck: No stridor.+ JVD Hematological/Lymphatic/Immunilogical: No cervical lymphadenopathy. Cardiovascular: Normal rate, regular rhythm. Grossly normal heart sounds.  Good peripheral circulation. Respiratory: Tachypnea with diffuse rhonchorous breath sounds and expiratory crackles most predominant on the left posterior lung fields with faint expiratory wheezing Gastrointestinal: Soft and nontender. No distention. No abdominal bruits. No CVA tenderness. Musculoskeletal: No lower extremity tenderness, 3+ edema.  No joint effusions. Neurologic:  Limited exam due to acute respiratory distress Skin:  Skin is warm, dry and intact. No rash noted.  ____________________________________________   LABS (all labs ordered are listed, but only abnormal results are displayed)  Results for orders placed or performed during the hospital encounter of 08/06/16 (from the past 24 hour(s))  Lactic acid, plasma     Status:  None   Collection Time: 08/06/16  8:51 AM  Result Value Ref Range   Lactic Acid, Venous 1.0 0.5 - 1.9 mmol/L  Comprehensive metabolic panel     Status: Abnormal   Collection Time: 08/06/16  8:51 AM  Result Value Ref Range   Sodium 142 135 - 145 mmol/L   Potassium 4.1 3.5 - 5.1 mmol/L   Chloride 111 101 - 111 mmol/L   CO2 22 22 - 32 mmol/L   Glucose, Bld 201 (H) 65 - 99 mg/dL   BUN 34 (H) 6 - 20 mg/dL   Creatinine, Ser 2.30 (H) 0.44 - 1.00 mg/dL   Calcium 8.9 8.9 - 10.3 mg/dL   Total Protein 7.0 6.5 - 8.1 g/dL   Albumin 2.7 (L) 3.5 - 5.0 g/dL   AST 23 15 - 41 U/L   ALT 11 (L) 14 - 54 U/L   Alkaline Phosphatase 81 38 - 126 U/L   Total Bilirubin 0.3 0.3 - 1.2 mg/dL   GFR calc non Af Amer 18 (L) >60 mL/min   GFR calc Af Amer 20 (L) >60 mL/min   Anion gap 9 5 - 15  Troponin I     Status: Abnormal   Collection Time: 08/06/16  8:51 AM  Result Value  Ref Range   Troponin I 0.08 (HH) <0.03 ng/mL  CBC WITH DIFFERENTIAL     Status: Abnormal   Collection Time: 08/06/16  8:51 AM  Result Value Ref Range   WBC 8.8 3.6 - 11.0 K/uL   RBC 4.08 3.80 - 5.20 MIL/uL   Hemoglobin 12.5 12.0 - 16.0 g/dL   HCT 37.6 35.0 - 47.0 %   MCV 92.1 80.0 - 100.0 fL   MCH 30.6 26.0 - 34.0 pg   MCHC 33.2 32.0 - 36.0 g/dL   RDW 16.9 (H) 11.5 - 14.5 %   Platelets 275 150 - 440 K/uL   Neutrophils Relative % 75 %   Neutro Abs 6.6 (H) 1.4 - 6.5 K/uL   Lymphocytes Relative 16 %   Lymphs Abs 1.4 1.0 - 3.6 K/uL   Monocytes Relative 6 %   Monocytes Absolute 0.5 0.2 - 0.9 K/uL   Eosinophils Relative 2 %   Eosinophils Absolute 0.2 0 - 0.7 K/uL   Basophils Relative 1 %   Basophils Absolute 0.0 0 - 0.1 K/uL   ____________________________________________  EKG My review and personal interpretation at Time: 8:52   Indication: sob  Rate: 90  Rhythm: sinus Axis: normal Other: ST depressions in inferolateral leads ____________________________________________  RADIOLOGY  I personally reviewed all radiographic  images ordered to evaluate for the above acute complaints and reviewed radiology reports and findings.  These findings were personally discussed with the patient.  Please see medical record for radiology report. ____________________________________________   PROCEDURES  Procedure(s) performed: none Procedures    Critical Care performed: yes CRITICAL CARE Performed by: Merlyn Lot   Total critical care time: 50 minutes  Critical care time was exclusive of separately billable procedures and treating other patients.  Critical care was necessary to treat or prevent imminent or life-threatening deterioration.  Critical care was time spent personally by me on the following activities: development of treatment plan with patient and/or surrogate as well as nursing, discussions with consultants, evaluation of patient's response to treatment, examination of patient, obtaining history from patient or surrogate, ordering and performing treatments and interventions, ordering and review of laboratory studies, ordering and review of radiographic studies, pulse oximetry and re-evaluation of patient's condition.  ____________________________________________   INITIAL IMPRESSION / ASSESSMENT AND PLAN / ED COURSE  Pertinent labs & imaging results that were available during my care of the patient were reviewed by me and considered in my medical decision making (see chart for details).  DDX: Asthma, copd, CHF, pna, ptx, malignancy, Pe, anemia   WANETTA FUNDERBURKE is a 82 y.o. who presents to the ED with acute respiratory distress with hypoxia. Bedside ultrasound shows just diffuse pulmonary edema bilateral lobes with increased EF on bedside echo. No pericardial effusion. No evidence of pneumothorax. Patient is a DO NOT RESUSCITATE and patient reiterates that she does not want to be intubated. We'll continue to try to maximize her therapy with BiPAP. Blood pressures appropriately and improving with  Nitropaste. EKG does show a strain pattern and ST depressions. Recently met a friend STEMI and was felt to high risk for intervention. Patient also with dark melena on exam consistent with chronic GI bleed. Patient is critically ill prognosis remains very poor.  The patient will be placed on continuous pulse oximetry and telemetry for monitoring.  Laboratory evaluation will be sent to evaluate for the above complaints.     Clinical Course as of Aug 06 1130  Thu Aug 06, 2016  2409 Repeat BP now  normotensive.  [PR]  1009 Anion gap: 9 [PR]  1012 Patient is doing much better clinically. Currently off nitroglycerin drip. Antibiotics currently infusing. I spoke with critical care agrees to come assess patient for admission to the ICU.  [PR]    Clinical Course User Index [PR] Merlyn Lot, MD     ____________________________________________   FINAL CLINICAL IMPRESSION(S) / ED DIAGNOSES  Final diagnoses:  Acute respiratory failure with hypoxia ([place])  Flash pulmonary edema ([place])  HCAP (healthcare-associated pneumonia)  Melena      NEW MEDICATIONS STARTED DURING THIS VISIT:  New Prescriptions   No medications on file     Note:  This document was prepared using Dragon voice recognition software and may include unintentional dictation errors.    Merlyn Lot, MD 08/06/16 [phone]

## 2016-08-06 NOTE — Progress Notes (Signed)
ANTIBIOTIC CONSULT NOTE - INITIAL  Pharmacy Consult for Vancomycin and Cefepime  Indication: Pneumonia  Allergies  Allergen Reactions  . Macrobid [employer][Nitrofurantoin Monohyd Macro]     Patient Measurements: Height: 5\' 7"  (170.2 cm) Weight: 173 lb 1 oz (78.5 kg) IBW/kg (Calculated) : 61.6 Adjusted Body Weight:   Vital Signs: Temp: 98 F (36.7 C) (12/07 0900) Temp Source: Rectal (12/07 0900) BP: 152/62 (12/07 1210) Pulse Rate: 63 (12/07 1210) Intake/Output from previous day: No intake/output data recorded. Intake/Output from this shift: Total I/O In: 257.4 [I.V.:7.4; IV Piggyback:250] Out: -   Labs:  Recent Labs  08/06/16 0851  WBC 8.8  HGB 12.5  PLT 275  CREATININE 2.30*   Estimated Creatinine Clearance: 17.6 mL/min (by C-G formula based on SCr of 2.3 mg/dL (H)). No results for input(s): VANCOTROUGH, VANCOPEAK, VANCORANDOM, GENTTROUGH, GENTPEAK, GENTRANDOM, TOBRATROUGH, TOBRAPEAK, TOBRARND, AMIKACINPEAK, AMIKACINTROU, AMIKACIN in the last 72 hours.   Microbiology: Recent Results (from the past 720 hour(s))  Urine culture     Status: None   Collection Time: 07/12/16 12:02 PM  Result Value Ref Range Status   Specimen Description URINE, RANDOM  Final   Special Requests NONE  Final   Culture NO GROWTH [facility]   Final   Report Status 07/14/2016 FINAL  Final    Medical History: Past Medical History:  Diagnosis Date  . Arthritis   . Blood transfusion without reported diagnosis   . CHF (congestive heart failure) (HCC)   . Coronary artery disease   . Hypertension   . Renal disorder   . Stroke [facility](HCC)     Medications:  Prescriptions Prior to Admission  Medication Sig Dispense Refill Last Dose  . amLODipine (NORVASC) 2.5 MG tablet Take 1 tablet (2.5 mg total) by mouth 2 (two) times daily. 30 tablet 0 08/05/2016 at 1700  . atorvastatin (LIPITOR) 40 MG tablet Take 40 mg by mouth daily.   08/05/2016 at 1700  . carvedilol (COREG) 25 MG tablet  Take 1 tablet (25 mg total) by mouth 2 (two) times daily. 60 tablet 5 08/05/2016 at 1700  . docusate sodium (COLACE) 100 MG capsule Take 100 mg by mouth 2 (two) times daily.   08/05/2016 at 1700  . ferrous gluconate (FERGON) 240 (27 FE) MG tablet Take 1 tablet (240 mg total) by mouth daily.   08/05/2016 at 1700  . folic acid (FOLVITE) 800 MCG tablet Take 400 mcg by mouth 2 (two) times daily as needed.    08/05/2016 at 1700  . hydrALAZINE (APRESOLINE) 50 MG tablet Take 1 tablet (50 mg total) by mouth every 8 (eight) hours. 90 tablet 5 08/05/2016 at 1700  . ipratropium-albuterol (DUONEB) 0.5-2.5 (3) MG/3ML SOLN Take 3 mLs by nebulization every 4 (four) hours as needed. 360 mL 0 08/05/2016 at 1430  . isosorbide mononitrate (IMDUR) 30 MG 24 hr tablet Take 30 mg by mouth daily.   08/05/2016 at 0830  . levothyroxine (SYNTHROID, LEVOTHROID) 112 MCG tablet Take 112 mcg by mouth daily.   08/05/2016 at 0730  . omega-3 acid ethyl esters (LOVAZA) 1 g capsule Take 1 g by mouth daily.   08/05/2016 at 1700  . pantoprazole (PROTONIX) 40 MG tablet Take 1 tablet (40 mg total) by mouth 2 (two) times daily. 60 tablet 5 08/05/2016 at 1700  . senna-docusate (SENOKOT-S) 8.6-50 MG tablet Take 1 tablet by mouth at bedtime as needed for mild constipation.   08/05/2016 at 1700  . sodium bicarbonate 650 MG tablet Take 650  mg by mouth 2 (two) times daily.   08/05/2016 at 1700  . vitamin B-12 (CYANOCOBALAMIN) 1000 MCG tablet Take 1,000 mcg by mouth daily.   08/05/2016 at 0830   Scheduled:  . amLODipine  2.5 mg Oral BID  . aspirin  324 mg Oral NOW   Or  . aspirin  300 mg Rectal NOW  . atorvastatin  40 mg Oral q1800  . [START ON 08/07/2016] ceFEPime (MAXIPIME) IV  1 g Intravenous Q24H  . docusate sodium  100 mg Oral BID  . insulin aspart  0-9 Units Subcutaneous Q4H  . isosorbide mononitrate  30 mg Oral Daily  . [START ON 08/07/2016] levothyroxine  112 mcg Oral QAC breakfast  . pantoprazole  40 mg Oral BID  . [START ON 08/07/2016]  vancomycin  1,000 mg Intravenous Q36H   Infusions:   Assessment: Pharmacy consulted to dose and monitor Vancomycin and Cefepime in this 82 year old critically ill woman. Patient is in AKi  Goal of Therapy:  Vancomycin trough level 15-20 mcg/ml  Plan:  Patient received Vancomycin 1 g IV x 1 and Cefepime 2 g IV x 1 in ED. Will start Cefepime 1 g IV q24 hours tomorrow. Since patient is in AKI, will need to dose Vancomycin per levels. Will place Vancomycin order as place holder for now and will assess renal function in the am to determine dosing strategy.    Menaal Russum D 08/06/2016,1:36 PM

## 2016-08-06 NOTE — ED Triage Notes (Signed)
Pt in via triage with respiratory distress.  Pt on BiPap upon arrival, alert, O2 sat 95%.  Pt recently hospitalized due to CHF and NSTEMI.  MD at bedside.

## 2016-08-07 ENCOUNTER — Inpatient Hospital Stay: Payer: Commercial Managed Care - HMO

## 2016-08-07 LAB — BASIC METABOLIC PANEL
ANION GAP: 7 (ref 5–15)
BUN: 36 mg/dL — AB (ref 6–20)
CHLORIDE: 111 mmol/L (ref 101–111)
CO2: 21 mmol/L — AB (ref 22–32)
Calcium: 8.6 mg/dL — ABNORMAL LOW (ref 8.9–10.3)
Creatinine, Ser: 2.28 mg/dL — ABNORMAL HIGH (ref 0.44–1.00)
GFR calc Af Amer: 21 mL/min — ABNORMAL LOW (ref >60)
GFR calc non Af Amer: 18 mL/min — ABNORMAL LOW (ref >60)
GLUCOSE: 88 mg/dL (ref 65–99)
POTASSIUM: 3.7 mmol/L (ref 3.5–5.1)
Sodium: 139 mmol/L (ref 135–145)

## 2016-08-07 LAB — GLUCOSE, CAPILLARY
GLUCOSE-CAPILLARY: 98 mg/dL (ref 65–99)
GLUCOSE-CAPILLARY: 114 mg/dL — AB (ref 65–99)
GLUCOSE-CAPILLARY: 117 mg/dL — AB (ref 65–99)
GLUCOSE-CAPILLARY: 96 mg/dL (ref 65–99)
Glucose-Capillary: 91 mg/dL (ref 65–99)

## 2016-08-07 LAB — CBC
HEMATOCRIT: 31 % — AB (ref 35.0–47.0)
Hemoglobin: 10.3 g/dL — ABNORMAL LOW (ref 12.0–16.0)
MCH: 30.5 pg (ref 26.0–34.0)
MCHC: 33.1 g/dL (ref 32.0–36.0)
MCV: 92.1 fL (ref 80.0–100.0)
Platelets: 235 10*3/uL (ref 150–440)
RBC: 3.37 MIL/uL — AB (ref 3.80–5.20)
RDW: 16.9 % — ABNORMAL HIGH (ref 11.5–14.5)
WBC: 4.4 10*3/uL (ref 3.6–11.0)

## 2016-08-07 LAB — PROCALCITONIN: Procalcitonin: 0.95 ng/mL

## 2016-08-07 MED ORDER — HYDRALAZINE HCL 50 MG PO TABS
50.0000 mg | ORAL_TABLET | Freq: Three times a day (TID) | ORAL | Status: DC
  Administered 2016-08-07 – 2016-08-12 (×13): 50 mg via ORAL
  Filled 2016-08-07 (×15): qty 1

## 2016-08-07 MED ORDER — VANCOMYCIN HCL IN DEXTROSE 1-5 GM/200ML-% IV SOLN
1000.0000 mg | INTRAVENOUS | Status: DC
  Filled 2016-08-07: qty 200

## 2016-08-07 MED ORDER — CARVEDILOL 25 MG PO TABS
25.0000 mg | ORAL_TABLET | Freq: Two times a day (BID) | ORAL | Status: DC
  Administered 2016-08-07 – 2016-08-12 (×11): 25 mg via ORAL
  Filled 2016-08-07 (×11): qty 1

## 2016-08-07 MED ORDER — DEXTROSE 5 % IV SOLN
1.0000 g | INTRAVENOUS | Status: DC
  Administered 2016-08-07 – 2016-08-08 (×2): 1 g via INTRAVENOUS
  Filled 2016-08-07 (×3): qty 10

## 2016-08-07 NOTE — Progress Notes (Signed)
ANTIBIOTIC CONSULT NOTE - FOLLOW UP   Pharmacy Consult for Vancomycin and Cefepime  Indication: Pneumonia  Allergies  Allergen Reactions  . Macrobid [employer][Nitrofurantoin Monohyd Macro]     Patient Measurements: Height: 5\' 7"  (170.2 cm) Weight: 157 lb 10.1 oz (71.5 kg) IBW/kg (Calculated) : 61.6 Adjusted Body Weight:   Vital Signs: Temp: 97.9 F (36.6 C) (12/08 0400) Temp Source: Oral (12/08 0400) BP: 166/95 (12/08 0700) Pulse Rate: 83 (12/08 0700) Intake/Output from previous day: 12/07 0701 - 12/08 0700 In: 377.4 [P.O.:120; I.V.:7.4; IV Piggyback:250] Out: -  Intake/Output from this shift: No intake/output data recorded.  Labs:  Recent Labs  08/06/16 0851 08/06/16 1316 08/07/16 0404  WBC 8.8 5.3 4.4  HGB 12.5 11.3* 10.3*  PLT 275 235 235  CREATININE 2.30*  --  2.28*   Estimated Creatinine Clearance: 15.9 mL/min (by C-G formula based on SCr of 2.28 mg/dL (H)). No results for input(s): VANCOTROUGH, VANCOPEAK, VANCORANDOM, GENTTROUGH, GENTPEAK, GENTRANDOM, TOBRATROUGH, TOBRAPEAK, TOBRARND, AMIKACINPEAK, AMIKACINTROU, AMIKACIN in the last 72 hours.   Microbiology: Recent Results (from the past 720 hour(s))  Urine culture     Status: None   Collection Time: 07/12/16 12:02 PM  Result Value Ref Range Status   Specimen Description URINE, RANDOM  Final   Special Requests NONE  Final   Culture NO GROWTH [facility]    Final   Report Status 07/14/2016 FINAL  Final  Blood Culture (routine x 2)     Status: None (Preliminary result)   Collection Time: 08/06/16  8:56 AM  Result Value Ref Range Status   Specimen Description BLOOD RIGHT AC  Final   Special Requests   Final    BOTTLES DRAWN AEROBIC AND ANAEROBIC AER 7ML ANA 15ML   Culture NO GROWTH < 24 HOURS  Final   Report Status PENDING  Incomplete  Blood Culture (routine x 2)     Status: None (Preliminary result)   Collection Time: 08/06/16  8:56 AM  Result Value Ref Range Status   Specimen  Description BLOOD LEFT AC  Final   Special Requests   Final    BOTTLES DRAWN AEROBIC AND ANAEROBIC AER 15ML ANA 15ML   Culture NO GROWTH < 24 HOURS  Final   Report Status PENDING  Incomplete  MRSA PCR Screening     Status: None   Collection Time: 08/06/16  2:49 PM  Result Value Ref Range Status   MRSA by PCR NEGATIVE NEGATIVE Final    Comment:        The GeneXpert MRSA Assay (FDA approved for NASAL specimens only), is one component of a comprehensive MRSA colonization surveillance program. It is not intended to diagnose MRSA infection nor to guide or monitor treatment for MRSA infections.     Medical History: Past Medical History:  Diagnosis Date  . Arthritis   . Blood transfusion without reported diagnosis   . CHF (congestive heart failure) (HCC)   . Coronary artery disease   . Hypertension   . Renal disorder   . Stroke [facility](HCC)     Medications:  Prescriptions Prior to Admission  Medication Sig Dispense Refill Last Dose  . amLODipine (NORVASC) 2.5 MG tablet Take 1 tablet (2.5 mg total) by mouth 2 (two) times daily. 30 tablet 0 08/05/2016 at 1700  . atorvastatin (LIPITOR) 40 MG tablet Take 40 mg by mouth daily.   08/05/2016 at 1700  . carvedilol (COREG) 25 MG tablet Take 1 tablet (25 mg total) by mouth 2 (two) times  daily. 60 tablet 5 08/05/2016 at 1700  . docusate sodium (COLACE) 100 MG capsule Take 100 mg by mouth 2 (two) times daily.   08/05/2016 at 1700  . ferrous gluconate (FERGON) 240 (27 FE) MG tablet Take 1 tablet (240 mg total) by mouth daily.   08/05/2016 at 1700  . folic acid (FOLVITE) 800 MCG tablet Take 400 mcg by mouth 2 (two) times daily as needed.    08/05/2016 at 1700  . hydrALAZINE (APRESOLINE) 50 MG tablet Take 1 tablet (50 mg total) by mouth every 8 (eight) hours. 90 tablet 5 08/05/2016 at 1700  . ipratropium-albuterol (DUONEB) 0.5-2.5 (3) MG/3ML SOLN Take 3 mLs by nebulization every 4 (four) hours as needed. 360 mL 0 08/05/2016 at 1430  . isosorbide mononitrate  (IMDUR) 30 MG 24 hr tablet Take 30 mg by mouth daily.   08/05/2016 at 0830  . levothyroxine (SYNTHROID, LEVOTHROID) 112 MCG tablet Take 112 mcg by mouth daily.   08/05/2016 at 0730  . omega-3 acid ethyl esters (LOVAZA) 1 g capsule Take 1 g by mouth daily.   08/05/2016 at 1700  . pantoprazole (PROTONIX) 40 MG tablet Take 1 tablet (40 mg total) by mouth 2 (two) times daily. 60 tablet 5 08/05/2016 at 1700  . senna-docusate (SENOKOT-S) 8.6-50 MG tablet Take 1 tablet by mouth at bedtime as needed for mild constipation.   08/05/2016 at 1700  . sodium bicarbonate 650 MG tablet Take 650 mg by mouth 2 (two) times daily.   08/05/2016 at 1700  . vitamin B-12 (CYANOCOBALAMIN) 1000 MCG tablet Take 1,000 mcg by mouth daily.   08/05/2016 at 0830   Scheduled:  . amLODipine  2.5 mg Oral BID  . atorvastatin  40 mg Oral q1800  . ceFEPime (MAXIPIME) IV  1 g Intravenous Q24H  . chlorhexidine  15 mL Mouth Rinse BID  . docusate sodium  100 mg Oral BID  . insulin aspart  0-9 Units Subcutaneous Q4H  . isosorbide mononitrate  30 mg Oral Daily  . levothyroxine  112 mcg Oral QAC breakfast  . mouth rinse  15 mL Mouth Rinse q12n4p  . pantoprazole  40 mg Oral BID  . vancomycin  1,000 mg Intravenous Q36H   Infusions:   Assessment: Pharmacy consulted to dose and monitor Vancomycin and Cefepime in this 82 year old critically ill woman. [place]   Goal of Therapy:  Vancomycin trough level 15-20 mcg/ml  Plan:  Will continue Cefepime 1 g IV q24 hours. Since patient is in AKI, will need to dose Vancomycin per levels. Vanc random level ordered to be drawn @ 2100 (~35 hours post dose).  Will place Vancomycin order as place holder for now and will assess renal function in the am to determine dosing strategy.    Ashana Tullo D 08/07/2016,8:41 AM

## 2016-08-07 NOTE — H&P (Addendum)
PULMONARY / CRITICAL CARE MEDICINE   Name: Melissa Bright MRN: [id number] DOB: 06/02/1924    ADMISSION DATE:  08/06/2016    SUBJECTIVE:  No current complaints at this time resting comfortably in Bipap.  REVIEW OF SYSTEMS:  Positives in BOLD Gen: Denies fever, chills, weight change, fatigue, night sweats HEENT: Denies blurred vision, double vision, hearing loss, tinnitus, sinus congestion, rhinorrhea, sore throat, neck stiffness, dysphagia PULM: shortness of breath, cough, sputum production, hemoptysis, wheezing CV: Denies chest pain, edema, orthopnea, paroxysmal nocturnal dyspnea, palpitations GI: Denies abdominal pain, nausea, vomiting, diarrhea, hematochezia, melena, constipation, change in bowel habits GU: Denies dysuria, hematuria, polyuria, oliguria, urethral discharge Endocrine: Denies hot or cold intolerance, polyuria, polyphagia or appetite change Derm: Denies rash, dry skin, scaling or peeling skin change Heme: Denies easy bruising, bleeding, bleeding gums Neuro: Denies headache, numbness, weakness, slurred speech, loss of memory or consciousness    VITAL SIGNS: BP (!) 166/95 (BP Location: Right Arm)   Pulse 83   Temp 97.9 F (36.6 C) (Oral)   Resp 19   Ht 5\' 7"  (1.702 m)   Wt 71.5 kg (157 lb 10.1 oz)   SpO2 95%   BMI 24.69 kg/m   HEMODYNAMICS:    VENTILATOR SETTINGS: FiO2 (%):  [30 %-50 %] 30 %  INTAKE / OUTPUT: I/O last 3 completed shifts: In: 377.4 [P.O.:120; I.V.:7.4; IV Piggyback:250] Out: -   PHYSICAL EXAMINATION: General:  Acutely ill appearing obese Caucasian female, well developed  Neuro:  Alert and oriented, follows commands, PERRLA  HEENT:  Supple, no JVD Cardiovascular:  NSR, s1s2, no M/R/G Lungs: course and diminished throughout, even, non labored on Bipap Abdomen:  +BS x4, obese, soft, non tender, non distended Musculoskeletal:  2+ non pitting bilateral lower extremity edema, moves all extremities Skin:  Intact no rashes or  lesions  LABS:  BMET  Recent Labs Lab 08/02/16 0526 08/06/16 0851 08/07/16 0404  NA 140 142 139  K 4.2 4.1 3.7  CL 111 111 111  CO2 24 22 21*  BUN 33* 34* 36*  CREATININE 2.60* 2.30* 2.28*  GLUCOSE 92 201* 88    Electrolytes  Recent Labs Lab 08/02/16 0526 08/06/16 0851 08/07/16 0404  CALCIUM 8.6* 8.9 8.6*    CBC  Recent Labs Lab 08/06/16 0851 08/06/16 1316 08/07/16 0404  WBC 8.8 5.3 4.4  HGB 12.5 11.3* 10.3*  HCT 37.6 34.3* 31.0*  PLT 275 235 235    Coag's No results for input(s): APTT, INR in the last 168 hours.  Sepsis Markers  Recent Labs Lab 08/06/16 0851 08/06/16 1316 08/07/16 0404  LATICACIDVEN 1.0  --   --   PROCALCITON  --  0.32 0.95    ABG No results for input(s): PHART, PCO2ART, PO2ART in the last 168 hours.  Liver Enzymes  Recent Labs Lab 08/01/16 1832 08/06/16 0851  AST 29 23  ALT 13* 11*  ALKPHOS 75 81  BILITOT 0.4 0.3  ALBUMIN 2.5* 2.7*    Cardiac Enzymes  Recent Labs Lab 08/06/16 0851 08/06/16 1534 08/06/16 2144  TROPONINI 0.08* 0.17* 0.13*    Glucose  Recent Labs Lab 08/06/16 1243 08/06/16 1615 08/06/16 1951 08/06/16 2335 08/07/16 0403 08/07/16 0747  GLUCAP 115* 72 97 87 91 98    Imaging Dg Chest Port 1 View  Result Date: 08/07/2016 CLINICAL DATA:  Acute respiratory failure. EXAM: PORTABLE CHEST 1 VIEW COMPARISON:  Yesterday at 0851 hour FINDINGS: Improving right upper lobe aeration with minimal residual streaky opacity. There is new hazy opacity  at the left lung base. Lower lung volumes from prior exam. borderline cardiomegaly. There is atherosclerosis of the thoracic aorta. No pulmonary edema or pneumothorax. An azygos fissure again seen. Right axillary stent is in place. Chronic right shoulder change. IMPRESSION: 1. Developing left basilar hazy opacity may be combination of pleural fluid and atelectasis versus pneumonia. 2. Improving right upper lobe aeration. Electronically Signed   By: Rubye Oaks M.D.   On: 08/07/2016 03:58   Dg Chest Portable 1 View  Result Date: 08/06/2016 CLINICAL DATA:  82 year old presenting with acute onset of respiratory distress. Recent hospitalization for NSTEMI. EXAM: PORTABLE CHEST 1 VIEW COMPARISON:  08/01/2016, 07/12/2016 and earlier. FINDINGS: Cardiac silhouette normal in size, unchanged. LAD and right coronary artery stents. Thoracic aorta atherosclerotic, unchanged. Hilar and mediastinal contours otherwise unremarkable. Mild patchy airspace opacities involving the right upper lobe, new since the examination 5 days ago. Lungs otherwise clear. Pulmonary vascularity normal. Stable chronic mild elevation of the left hemidiaphragm. Azygos fissure again noted. IMPRESSION: Early/mild acute right upper lobe pneumonia. Electronically Signed   By: Hulan Saas M.D.   On: 08/06/2016 09:12   STUDIES:  Echo 12/3>>EF 45%  CULTURES: Blood x2 12/7>> Urine 12/7>>   ANTIBIOTICS: Vancomycin 12/7>> Cefepime 12/7>>  SIGNIFICANT EVENTS: 12/7-Pt admitted to [facility] Unit due to acute hypoxic respiratory failure secondary to HCAP and Pulmonary Edema  LINES/TUBES: PIV's 12/7>>  ASSESSMENT / PLAN:  PULMONARY A: Acute hypoxic respiratory failure secondary to HCAP and Pulmonary Edema P:   Wean oxygen.  Maintain O2 sats >92% Prn Bronchodilators CXR in am Prn ABG's  CARDIOVASCULAR A:  Hypertension- persistent, currently on amlodipine. Hx: CVA, CAD, and Chronic diastolic CHF P:  Continue outpatient Norvasc,imdur. On hydralazine 10 mg prn. --Add scheduled coreg, hydralazine out home doses.    Continuous telemetry Trend troponin's  RENAL A:   Acute on chronic renal failure  CKD stage IV P:   Trend BMP's Replace electrolytes as indicated Monitor UOP Avoid nephrotoxic drugs  GASTROINTESTINAL A:   No acute issues  Hx: GERD P:   Advance diet.  Protonix for GERD  HEMATOLOGIC A:   No acute issues Hx: GI bleed with acute anemia and  Dietary folate deficiency anemia (baseline Hgb 7.7) P:  SCD's for VTE prophylaxis Trend CBC's Monitor for s/sx of bleeding Transfuse for hgb <7  INFECTIOUS A:   HCAP P:   Trend WBC's and monitor fever curve Trend PCT's and lactic acid Continue abx as above Follow cultures   ENDOCRINE A:   Hyperglycemia  P:   SSI  CBG's q4hrs   NEUROLOGIC A:   No acute issues  Hx: Arthritis  P:   Avoid sedating medications Prn Tylenol for pain management Promote family presence at bedside   FAMILY  - Updates: Pts daughters updated about plan of care and questions answered will continue to monitor and assess pt 12/7  - Inter-disciplinary family meet or Palliative Care meeting due by: 08/13/2016   Sonda Rumble, AGNP  Pulmonary/Critical Care Pager [phone] (please enter 7 digits) PCCM Consult Pager [phone] (please enter 7 digits)  Pt seen and examined with NP, I agree with assessment and plan as amended above.  The  Pt admitted to [facility] Unit due to acute hypoxic respiratory failure secondary to HCAP and Pulmonary Edema. Continue abx, diuresis,  wean off bipap.  Discussed with Dr. Nemiah Commander.   [name], M.D.  08/07/2016  Critical Care Attestation.  I have personally obtained a history, examined the patient, evaluated laboratory and  imaging results, formulated the assessment and plan and placed orders. The Patient requires high complexity decision making for assessment and support, frequent evaluation and titration of therapies, application of advanced monitoring technologies and extensive interpretation of multiple databases. The patient has critical illness that could lead imminently to failure of 1 or more organ systems and requires the highest level of physician preparedness to intervene.  Critical Care Time devoted to patient care services described in this note is 45 minutes and is exclusive of time spent in procedures.

## 2016-08-07 NOTE — Progress Notes (Signed)
82 year old female with past medical history significant for CK D stage IV, anemia of chronic disease, GERD, congestive heart failure who had 2 hospitalizations in the last  Month for GI bleed requiring transfusion and NSTEMI admitted to ICU on 08/06/2016 for acute hypoxic respiratory failure secondary to CHF exacerbation. Patient needed BiPAP and has been weaned off after diuresis. She is currently on nasal cannula. Her EF has dropped to 45% with anteroseptal hypokinesis from her recent NSTEMI last week.  Patient will be transferred to hospitalist service. Hospitalists will assume care from tomorrow

## 2016-08-07 NOTE — Progress Notes (Signed)
Pt is in no distress. Pt is on room air. Bipap is not in the room and is not needed at time.

## 2016-08-08 LAB — BASIC METABOLIC PANEL
Anion gap: 6 (ref 5–15)
BUN: 37 mg/dL — AB (ref 6–20)
CHLORIDE: 115 mmol/L — AB (ref 101–111)
CO2: 21 mmol/L — ABNORMAL LOW (ref 22–32)
Calcium: 8.5 mg/dL — ABNORMAL LOW (ref 8.9–10.3)
Creatinine, Ser: 2.24 mg/dL — ABNORMAL HIGH (ref 0.44–1.00)
GFR, EST AFRICAN AMERICAN: 21 mL/min — AB (ref >60)
GFR, EST NON AFRICAN AMERICAN: 18 mL/min — AB (ref >60)
Glucose, Bld: 96 mg/dL (ref 65–99)
POTASSIUM: 4 mmol/L (ref 3.5–5.1)
SODIUM: 142 mmol/L (ref 135–145)

## 2016-08-08 LAB — CBC
HCT: 29.2 % — ABNORMAL LOW (ref 35.0–47.0)
HEMOGLOBIN: 9.8 g/dL — AB (ref 12.0–16.0)
MCH: 31.1 pg (ref 26.0–34.0)
MCHC: 33.5 g/dL (ref 32.0–36.0)
MCV: 92.9 fL (ref 80.0–100.0)
PLATELETS: 194 10*3/uL (ref 150–440)
RBC: 3.14 MIL/uL — AB (ref 3.80–5.20)
RDW: 16.5 % — ABNORMAL HIGH (ref 11.5–14.5)
WBC: 4.4 10*3/uL (ref 3.6–11.0)

## 2016-08-08 LAB — URINE CULTURE

## 2016-08-08 LAB — GLUCOSE, CAPILLARY
GLUCOSE-CAPILLARY: 102 mg/dL — AB (ref 65–99)
GLUCOSE-CAPILLARY: 92 mg/dL (ref 65–99)
GLUCOSE-CAPILLARY: 96 mg/dL (ref 65–99)
Glucose-Capillary: 103 mg/dL — ABNORMAL HIGH (ref 65–99)

## 2016-08-08 LAB — PROCALCITONIN: PROCALCITONIN: 1 ng/mL

## 2016-08-08 MED ORDER — AMOXICILLIN-POT CLAVULANATE 500-125 MG PO TABS
500.0000 mg | ORAL_TABLET | Freq: Two times a day (BID) | ORAL | Status: DC
  Administered 2016-08-09 – 2016-08-12 (×7): 500 mg via ORAL
  Filled 2016-08-08 (×7): qty 1

## 2016-08-08 MED ORDER — FUROSEMIDE 10 MG/ML IJ SOLN
20.0000 mg | Freq: Once | INTRAMUSCULAR | Status: AC
  Administered 2016-08-08: 20 mg via INTRAVENOUS

## 2016-08-08 MED ORDER — FUROSEMIDE 10 MG/ML IJ SOLN
INTRAMUSCULAR | Status: AC
  Filled 2016-08-08: qty 2

## 2016-08-08 MED ORDER — IPRATROPIUM-ALBUTEROL 0.5-2.5 (3) MG/3ML IN SOLN
3.0000 mL | Freq: Four times a day (QID) | RESPIRATORY_TRACT | Status: DC
  Administered 2016-08-08 – 2016-08-12 (×17): 3 mL via RESPIRATORY_TRACT
  Filled 2016-08-08 (×17): qty 3

## 2016-08-08 NOTE — Progress Notes (Signed)
Pt with dyspnea/orthopnea this AM. Sats 96 % on RA. BP 178/63, HR 83. Pt with upper airway expiratory wheezing and rhonchi in all lobes. Dr Tobi BastosPyreddy notified, will administer lasix and monitor closely. RT evaluated pt this AM.

## 2016-08-08 NOTE — Progress Notes (Signed)
RT called by RN to assess patient.  Patient 98% on 2LPM [place], audibly wheezing, complaining of 9/10 pain through chest.  RN made aware.  Will continue to monitor patient.

## 2016-08-08 NOTE — Progress Notes (Signed)
Per Dr. Elisabeth PigeonVachhani okay to discontinue order for fingersticks

## 2016-08-08 NOTE — Progress Notes (Signed)
Physical Therapy Evaluation Patient Details Name: Melissa Bright MRN: [id number] DOB: 08/20/1924 Today's Date: 08/08/2016   History of Present Illness  Patient is a 82 y.o. female admitted on 07 DEC for increasing SOB and hypoxia. Was on BiPAP and has weaned to 2L O2. Patient was admitted 12-17 NOV for acute anemia, secondary to GI bleed and STEMI. PMH includes CVA, renal disorder, dietary folate deficiency, anemia, CAD, arthritis, GERD, CKD IV, and chronic diastolic CHF.  Clinical Impression  Patient is a pleasant 82 y.o. Female oriented to self upon evaluation. Patient's history obtained from daughter at bedside who admits that patient is non-ambulatory and uses hoyer lift/manual wheelchair for transfers/mobility. Upon evaluation, patient demonstrates need for moderate assistance with bed mobility and supine to sit transfers. Patient able to maintain seated at EOB with mild cues for posture for 2.5 minutes until fatigue. Required increased assistance to return to supine due to fatigue. Patient's daughter expressed that she performed at baseline level of function and that previously HHPT in past made her symptoms worse, so she preferred no f/u. PT educated patient's daughter about seated postural exercises to further improve muscular endurance to improve ease of transfers at home. Patient will continue to benefit from skilled and progressive PT to improve functional postural strength/endurance during hospital stay to allow for improved function at home.    Follow Up Recommendations No PT follow up    Equipment Recommendations       Recommendations for Other Services       Precautions / Restrictions Precautions Precautions: Fall Restrictions Weight Bearing Restrictions: No      Mobility  Bed Mobility Overal bed mobility: Needs Assistance Bed Mobility: Supine to Sit;Sit to Supine;Rolling Rolling: Mod assist   Supine to sit: Mod assist Sit to supine: Max assist   General bed mobility  comments: Patient requires moderate assistance to roll R. Able to move into sitting with verbal cues and moderate assistance. Maximal assistance for LEs to return to supine.  Transfers                    Ambulation/Gait                Stairs            Wheelchair Mobility    Modified Rankin (Stroke Patients Only)       Balance Overall balance assessment: Needs assistance Sitting-balance support: Feet supported Sitting balance-Leahy Scale: Fair Sitting balance - Comments: Increased forward lean, 2.5 minutes to fatigue                                     Pertinent Vitals/Pain Pain Assessment: No/denies pain    Home Living Family/patient expects to be discharged to:: Private residence Living Arrangements: Children Available Help at Discharge: Family;Available 24 hours/day;Other (Comment) (Daughters rotate day/night shifts for 24 hour coverage) Type of Home: House Home Access: Ramped entrance     Home Layout: One level Home Equipment: Bedside commode;Wheelchair - manual;Other (comment) Secondary school teacher(Hoyer lift)      Prior Function Level of Independence: Needs assistance   Gait / Transfers Assistance Needed: Non-ambulatory, uses hoyer lift/manual wheelchair  ADL's / Homemaking Assistance Needed: Performed by children        Hand Dominance        Extremity/Trunk Assessment   Upper Extremity Assessment: Generalized weakness (3-/5 shoulder AROM, 4-/5 elbow/wrist/hand)  Lower Extremity Assessment: Generalized weakness (3+-4-/5 MMT)         Communication   Communication: HOH  Cognition Arousal/Alertness: Awake/alert Behavior During Therapy: WFL for tasks assessed/performed Overall Cognitive Status: History of cognitive impairments - at baseline                      General Comments      Exercises     Assessment/Plan    PT Assessment Patient needs continued PT services  PT Problem List Decreased  strength;Decreased activity tolerance;Decreased balance;Decreased mobility;Decreased safety awareness;Decreased cognition          PT Treatment Interventions Functional mobility training;Therapeutic activities;Therapeutic exercise;Balance training;Cognitive remediation;Patient/family education    PT Goals (Current goals can be found in the Care Plan section)  Acute Rehab PT Goals Patient Stated Goal: Unstated PT Goal Formulation: With patient/family Time For Goal Achievement: 08/22/16 Potential to Achieve Goals: Fair    Frequency Min 2X/week   Barriers to discharge        Co-evaluation               End of Session Equipment Utilized During Treatment: Oxygen Activity Tolerance: Patient limited by fatigue Patient left: in bed;with call bell/phone within reach;with bed alarm set;with family/visitor present Nurse Communication: Mobility status         Time: 1430-1455 PT Time Calculation (min) (ACUTE ONLY): 25 min   Charges:   PT Evaluation $PT Eval Low Complexity: 1 Procedure     PT G Codes:        Neita CarpJulie Ann Ercel Bright, PT, DPT 08/08/2016, 3:09 PM

## 2016-08-09 MED ORDER — LATANOPROST 0.005 % OP SOLN
1.0000 [drp] | Freq: Every day | OPHTHALMIC | Status: DC
  Administered 2016-08-09 – 2016-08-11 (×3): 1 [drp] via OPHTHALMIC
  Filled 2016-08-09 (×2): qty 2.5

## 2016-08-09 NOTE — Progress Notes (Signed)
Okay per Dr. Elisabeth PigeonVachhani to restart patients home glaucoma med that was just added to home med list by RN

## 2016-08-09 NOTE — Progress Notes (Signed)
[facility] [place] [facility]   PATIENT NAME: Melissa Bright    MR#:  [id number]  DATE OF BIRTH:  11/26/1923  SUBJECTIVE:  CHIEF COMPLAINT:   Chief Complaint  Patient presents with  . Respiratory Distress    Recent MI- came with respi distress and was on Bipap- so in ICU care, now improved. On nasal canula oxygen. No complains.  REVIEW OF SYSTEMS:  CONSTITUTIONAL: No fever, fatigue or weakness.  EYES: No blurred or double vision.  EARS, NOSE, AND THROAT: No tinnitus or ear pain.  RESPIRATORY: No cough, positive for shortness of breath, wheezing or hemoptysis.  CARDIOVASCULAR: No chest pain, orthopnea, edema.  GASTROINTESTINAL: No nausea, vomiting, diarrhea or abdominal pain.  GENITOURINARY: No dysuria, hematuria.  ENDOCRINE: No polyuria, nocturia,  HEMATOLOGY: No anemia, easy bruising or bleeding SKIN: No rash or lesion. MUSCULOSKELETAL: No joint pain or arthritis.   NEUROLOGIC: No tingling, numbness, weakness.  PSYCHIATRY: No anxiety or depression.   ROS  DRUG ALLERGIES:   Allergies  Allergen Reactions  . Macrobid [Nitrofurantoin Monohyd Macro]     VITALS:  Blood pressure (!) 136/91, pulse 80, temperature 97.5 F (36.4 C), temperature source Oral, resp. rate 16, height 5\' 7"  (1.702 m), weight 66.7 kg (147 lb 1.6 oz), SpO2 96 %.  PHYSICAL EXAMINATION:  GENERAL:  [age] y.o.-year-old patient lying in the bed with no acute distress.  EYES: Pupils equal, round, reactive to light and accommodation. No scleral icterus. Extraocular muscles intact.  HEENT: Head atraumatic, normocephalic. Oropharynx and nasopharynx clear.  NECK:  Supple, no jugular venous distention. No thyroid enlargement, no tenderness.  LUNGS: Normal breath sounds bilaterally, some wheezing, rales,rhonchi or crepitation. No use of accessory muscles of respiration. On supplemental oxygen. CARDIOVASCULAR: S1, S2 normal. No murmurs, rubs, or gallops.  ABDOMEN: Soft, nontender, nondistended.  Bowel sounds present. No organomegaly or mass.  EXTREMITIES: No pedal edema, cyanosis, or clubbing.  NEUROLOGIC: Cranial nerves II through XII are intact. Muscle strength 5/5 in all extremities. Sensation intact. Gait not checked.  PSYCHIATRIC: The patient is alert and oriented x 3.  SKIN: No obvious rash, lesion, or ulcer.   Physical Exam LABORATORY PANEL:   CBC  Recent Labs Lab 08/08/16 0448  WBC 4.4  HGB 9.8*  HCT 29.2*  PLT 194   ------------------------------------------------------------------------------------------------------------------  Chemistries   Recent Labs Lab 08/06/16 0851  08/08/16 0448  NA 142  < > 142  K 4.1  < > 4.0  CL 111  < > 115*  CO2 22  < > 21*  GLUCOSE 201*  < > 96  BUN 34*  < > 37*  CREATININE 2.30*  < > 2.24*  CALCIUM 8.9  < > 8.5*  AST 23  --   --   ALT 11*  --   --   ALKPHOS 81  --   --   BILITOT 0.3  --   --   < > = values in this interval not displayed. ------------------------------------------------------------------------------------------------------------------  Cardiac Enzymes  Recent Labs Lab 08/06/16 1534 08/06/16 2144  TROPONINI 0.17* 0.13*   ------------------------------------------------------------------------------------------------------------------  RADIOLOGY:  No results found.  ASSESSMENT AND PLAN:   Active Problems:   Acute respiratory failure (HCC)  * Acute hypoxic respi failure due to HCAP and acute systolic CHF   Initially required Bipap. Now on nasal canula oxygen   Responding to treatment with lasix.   IV ceftriaxone- change to augmentin now- due to Ur cx report. ( to cover both UTI and pneumonia)  *  UTi   As per cx result- changed to augmentin.  * CKD   Stable, monitor now.  * Hyperglycemia   Started on SSi, but now blood sugar is stable, so d/c finger stick and sliding scale.  * hypertension   Cont amlodipine, coreg.  * hypothyroidism   Levothyroxine.   All the records are  reviewed and case discussed with Care Management/Social Workerr. Management plans discussed with the patient, family and they are in agreement.  CODE STATUS: DNR  TOTAL TIME TAKING CARE OF THIS PATIENT: 35 minutes.     POSSIBLE D/C IN 1-2 DAYS, DEPENDING ON CLINICAL CONDITION.   Melissa DillingVACHHANI, Melissa Bright M.D on 08/09/2016   Between 7am to 6pm - Pager - [phone]  After 6pm go to [email or web address] - password [employer] [email or web address]  [facility] [place] [facility]  Office  [phone]  CC: Primary care physician; Melissa IvanLINTHAVONG, [name], MD  Note: This dictation was prepared with Dragon dictation along with smaller phrase technology. Any transcriptional errors that result from this process are unintentional.

## 2016-08-09 NOTE — Progress Notes (Signed)
[employer] - [place] at [facility]   PATIENT NAME: Melissa Bright    MR#:  [id number]  DATE OF BIRTH:  02/23/1924  SUBJECTIVE:  CHIEF COMPLAINT:   Chief Complaint  Patient presents with  . Respiratory Distress    Recent MI- came with respi distress and was on Bipap- so in ICU care, now improved. On room air now but has complain of cough and significant secretions along with weakness.  REVIEW OF SYSTEMS:  CONSTITUTIONAL: No fever, fatigue or weakness.  EYES: No blurred or double vision.  EARS, NOSE, AND THROAT: No tinnitus or ear pain.  RESPIRATORY: No cough, positive for shortness of breath, wheezing or hemoptysis.  CARDIOVASCULAR: No chest pain, orthopnea, edema.  GASTROINTESTINAL: No nausea, vomiting, diarrhea or abdominal pain.  GENITOURINARY: No dysuria, hematuria.  ENDOCRINE: No polyuria, nocturia,  HEMATOLOGY: No anemia, easy bruising or bleeding SKIN: No rash or lesion. MUSCULOSKELETAL: No joint pain or arthritis.   NEUROLOGIC: No tingling, numbness, weakness.  PSYCHIATRY: No anxiety or depression.   ROS  DRUG ALLERGIES:   Allergies  Allergen Reactions  . Macrobid [Nitrofurantoin Monohyd Macro]     VITALS:  Blood pressure (!) 136/50, pulse 72, temperature 98.7 F (37.1 C), temperature source Oral, resp. rate 17, height 5\' 7"  (1.702 m), weight 66.7 kg (147 lb 1.6 oz), SpO2 94 %.  PHYSICAL EXAMINATION:  GENERAL:  82 y.o.-year-old patient lying in the bed with no acute distress.  EYES: Pupils equal, round, reactive to light and accommodation. No scleral icterus. Extraocular muscles intact.  HEENT: Head atraumatic, normocephalic. Oropharynx and nasopharynx clear.  NECK:  Supple, no jugular venous distention. No thyroid enlargement, no tenderness.  LUNGS: Normal breath sounds bilaterally, some Sounds of secretion from upper airways. No use of accessory muscles of respiration. On supplemental oxygen. CARDIOVASCULAR: S1, S2 normal. No murmurs, rubs, or  gallops.  ABDOMEN: Soft, nontender, nondistended. Bowel sounds present. No organomegaly or mass.  EXTREMITIES: No pedal edema, cyanosis, or clubbing.  NEUROLOGIC: Cranial nerves II through XII are intact. Muscle strength 5/5 in all extremities. Sensation intact. Gait not checked.  PSYCHIATRIC: The patient is alert and oriented x 3.  SKIN: No obvious rash, lesion, or ulcer.   Physical Exam LABORATORY PANEL:   CBC  Recent Labs Lab 08/08/16 0448  WBC 4.4  HGB 9.8*  HCT 29.2*  PLT 194   ------------------------------------------------------------------------------------------------------------------  Chemistries   Recent Labs Lab 08/06/16 0851  08/08/16 0448  NA 142  < > 142  K 4.1  < > 4.0  CL 111  < > 115*  CO2 22  < > 21*  GLUCOSE 201*  < > 96  BUN 34*  < > 37*  CREATININE 2.30*  < > 2.24*  CALCIUM 8.9  < > 8.5*  AST 23  --   --   ALT 11*  --   --   ALKPHOS 81  --   --   BILITOT 0.3  --   --   < > = values in this interval not displayed. ------------------------------------------------------------------------------------------------------------------  Cardiac Enzymes  Recent Labs Lab 08/06/16 1534 08/06/16 2144  TROPONINI 0.17* 0.13*   ------------------------------------------------------------------------------------------------------------------  RADIOLOGY:  No results found.  ASSESSMENT AND PLAN:   Active Problems:   Acute respiratory failure (HCC)  * Acute hypoxic respi failure due to HCAP and acute systolic CHF   Initially required Bipap. Now on nasal canula oxygen   Responding to treatment with lasix.   IV ceftriaxone- change to augmentin now- due to  Ur cx report. ( to cover both UTI and pneumonia)   On room air now, encouraged to use incentive spirometry and have chest physical therapy to facilitate drainage of secretions.  * UTi   As per cx result- changed to augmentin.  * CKD   Stable, monitor now.  * Hyperglycemia   Started on SSi,  but now blood sugar is stable, so d/c finger stick and sliding scale.  * hypertension   Cont amlodipine, coreg.  * hypothyroidism   Levothyroxine.   All the records are reviewed and case discussed with Care Management/Social Workerr. Management plans discussed with the patient, family and they are in agreement.  CODE STATUS: DNR  TOTAL TIME TAKING CARE OF THIS PATIENT: 35 minutes.   Discussed with patient's daughter was present in the room.  POSSIBLE D/C IN 1-2 DAYS, DEPENDING ON CLINICAL CONDITION.   [name], Melissa Bright M.D on 08/09/2016   Between 7am to 6pm - Pager - [phone]  After 6pm go to [email or web address] - password [employer] [email or web address]  [facility] [place] [facility]  Office  [phone]  CC: Primary care physician; [name], [name], MD  Note: This dictation was prepared with Dragon dictation along with smaller phrase technology. Any transcriptional errors that result from this process are unintentional.

## 2016-08-10 ENCOUNTER — Inpatient Hospital Stay: Payer: Commercial Managed Care - HMO

## 2016-08-10 DIAGNOSIS — J181 Lobar pneumonia, unspecified organism: Secondary | ICD-10-CM

## 2016-08-10 LAB — BASIC METABOLIC PANEL
ANION GAP: 5 (ref 5–15)
BUN: 35 mg/dL — AB (ref 6–20)
CHLORIDE: 116 mmol/L — AB (ref 101–111)
CO2: 22 mmol/L (ref 22–32)
Calcium: 8.6 mg/dL — ABNORMAL LOW (ref 8.9–10.3)
Creatinine, Ser: 2.11 mg/dL — ABNORMAL HIGH (ref 0.44–1.00)
GFR calc Af Amer: 23 mL/min — ABNORMAL LOW (ref >60)
GFR, EST NON AFRICAN AMERICAN: 20 mL/min — AB (ref >60)
GLUCOSE: 82 mg/dL (ref 65–99)
POTASSIUM: 3.8 mmol/L (ref 3.5–5.1)
Sodium: 143 mmol/L (ref 135–145)

## 2016-08-10 LAB — CBC
HCT: 27.4 % — ABNORMAL LOW (ref 35.0–47.0)
Hemoglobin: 9.1 g/dL — ABNORMAL LOW (ref 12.0–16.0)
MCH: 30.8 pg (ref 26.0–34.0)
MCHC: 33.3 g/dL (ref 32.0–36.0)
MCV: 92.5 fL (ref 80.0–100.0)
Platelets: 195 10*3/uL (ref 150–440)
RBC: 2.96 MIL/uL — ABNORMAL LOW (ref 3.80–5.20)
RDW: 16.3 % — ABNORMAL HIGH (ref 11.5–14.5)
WBC: 3.3 10*3/uL — ABNORMAL LOW (ref 3.6–11.0)

## 2016-08-10 LAB — TSH: TSH: 15.871 u[IU]/mL — ABNORMAL HIGH (ref 0.350–4.500)

## 2016-08-10 MED ORDER — LACTULOSE 10 GM/15ML PO SOLN
20.0000 g | Freq: Two times a day (BID) | ORAL | Status: DC | PRN
  Administered 2016-08-10: 20 g via ORAL
  Filled 2016-08-10: qty 30

## 2016-08-10 MED ORDER — FUROSEMIDE 20 MG PO TABS
20.0000 mg | ORAL_TABLET | Freq: Every day | ORAL | Status: DC
  Administered 2016-08-10 – 2016-08-12 (×3): 20 mg via ORAL
  Filled 2016-08-10 (×3): qty 1

## 2016-08-10 MED ORDER — HEPARIN SODIUM (PORCINE) 5000 UNIT/ML IJ SOLN
5000.0000 [IU] | Freq: Three times a day (TID) | INTRAMUSCULAR | Status: DC
  Administered 2016-08-10 – 2016-08-12 (×5): 5000 [IU] via SUBCUTANEOUS
  Filled 2016-08-10 (×5): qty 1

## 2016-08-10 MED ORDER — BISACODYL 10 MG RE SUPP
10.0000 mg | Freq: Every day | RECTAL | Status: DC | PRN
  Administered 2016-08-10: 10 mg via RECTAL
  Filled 2016-08-10: qty 1

## 2016-08-10 NOTE — Progress Notes (Signed)
Pt profile: Admitted 08/06/16 to SDU with respiratory distress and PNA  Subj: No overt distress. No new complaints. Indicates that she is feeling slightly better  Obj: Vitals:   08/10/16 1052 08/10/16 1240  BP: (!) 142/86 (!) 129/44  Pulse:  72  Resp:  19  Temp:  98.4 F (36.9 C)    Gen: elderly, NAD, very weak HEENT: WNL Neck: No JVD Chest: No wheezes Cardiac: Reg, no M LKG:MWNUAbd:NABS. soft Ext:  No pitting edema  BMET BMP Latest Ref Rng & Units 08/10/2016 08/08/2016 08/07/2016  Glucose 65 - 99 mg/dL 82 96 88  BUN 6 - 20 mg/dL 27(O35(H) 53(G37(H) 64(Q36(H)  Creatinine 0.44 - 1.00 mg/dL 0.34(V2.11(H) 4.25(Z2.24(H) 5.63(O2.28(H)  Sodium 135 - 145 mmol/L 143 142 139  Potassium 3.5 - 5.1 mmol/L 3.8 4.0 3.7  Chloride 101 - 111 mmol/L 116(H) 115(H) 111  CO2 22 - 32 mmol/L 22 21(L) 21(L)  Calcium 8.9 - 10.3 mg/dL 7.5(I8.6(L) 4.3(P8.5(L) 2.9(J8.6(L)    CBC CBC Latest Ref Rng & Units 08/10/2016 08/08/2016 08/07/2016  WBC 3.6 - 11.0 K/uL 3.3(L) 4.4 4.4  Hemoglobin 12.0 - 16.0 g/dL 1.8(A9.1(L) 4.1(Y9.8(L) 10.3(L)  Hematocrit 35.0 - 47.0 % 27.4(L) 29.2(L) 31.0(L)  Platelets 150 - 440 K/uL 195 194 235     CXR (08/07/16):  LLL infiltrate   IMPRESSION: LLL CAP, NOS  PLAN/RECS:  Continue Augmentin for now CXR AM 12/12 Ultimately complete 10 days antibiotics (through 12/16)   Billy Fischeravid Simonds, MD PCCM service Mobile [phone] Pager [phone] 08/10/2016

## 2016-08-10 NOTE — Progress Notes (Signed)
Initial Heart Failure Clinic appointment scheduled on September 01, 2016 at 10:00am. Thank you

## 2016-08-10 NOTE — Progress Notes (Signed)
[facility] - [place] at [facility]   PATIENT NAME: Melissa Bright    MR#:  [id number]  DATE OF BIRTH:  01/13/1924  SUBJECTIVE:  CHIEF COMPLAINT:   Chief Complaint  Patient presents with  . Respiratory Distress    Recent MI- came with respi distress and was on Bipap- so in ICU care, now improved. On room air now but has complain of cough and significant secretions along with weakness.  she appears significantly weak today, and not participating in conversations today. Daughter in room.  REVIEW OF SYSTEMS:  CONSTITUTIONAL: No fever,positive for fatigue or weakness.  EYES: No blurred or double vision.  EARS, NOSE, AND THROAT: No tinnitus or ear pain.  RESPIRATORY: No cough, positive for shortness of breath, wheezing or hemoptysis.  CARDIOVASCULAR: No chest pain, orthopnea, edema.  GASTROINTESTINAL: No nausea, vomiting, diarrhea or abdominal pain.  GENITOURINARY: No dysuria, hematuria.  ENDOCRINE: No polyuria, nocturia,  HEMATOLOGY: No anemia, easy bruising or bleeding SKIN: No rash or lesion. MUSCULOSKELETAL: No joint pain or arthritis.   NEUROLOGIC: No tingling, numbness, weakness.  PSYCHIATRY: No anxiety or depression.   ROS  DRUG ALLERGIES:   Allergies  Allergen Reactions  . Macrobid [Nitrofurantoin Monohyd Macro]     VITALS:  Blood pressure 138/62, pulse 72, temperature 98.4 F (36.9 C), temperature source Oral, resp. rate 19, height 5\' 7"  (1.702 m), weight 67.6 kg (149 lb), SpO2 94 %.  PHYSICAL EXAMINATION:  GENERAL:  82 y.o.-year-old patient lying in the bed with no acute distress.  EYES: Pupils equal, round, reactive to light and accommodation. No scleral icterus. Extraocular muscles intact.  HEENT: Head atraumatic, normocephalic. Oropharynx and nasopharynx clear.  NECK:  Supple, no jugular venous distention. No thyroid enlargement, no tenderness.  LUNGS: Normal breath sounds bilaterally, some Sounds of secretion from upper airways. No use of  accessory muscles of respiration. On supplemental oxygen. CARDIOVASCULAR: S1, S2 normal. No murmurs, rubs, or gallops.  ABDOMEN: Soft, nontender, nondistended. Bowel sounds present. No organomegaly or mass.  EXTREMITIES: No pedal edema, cyanosis, or clubbing.  NEUROLOGIC: Cranial nerves II through XII are intact except face slightly deviated to left side- as per daughter - she had old stroke and this is chronic. Muscle strength 4/5 in all extremities. Sensation intact. Gait not checked.  PSYCHIATRIC: The patient is alert and oriented x 3.  SKIN: No obvious rash, lesion, or ulcer.   Physical Exam LABORATORY PANEL:   CBC  Recent Labs Lab 08/10/16 0430  WBC 3.3*  HGB 9.1*  HCT 27.4*  PLT 195   ------------------------------------------------------------------------------------------------------------------  Chemistries   Recent Labs Lab 08/06/16 0851  08/10/16 0430  NA 142  < > 143  K 4.1  < > 3.8  CL 111  < > 116*  CO2 22  < > 22  GLUCOSE 201*  < > 82  BUN 34*  < > 35*  CREATININE 2.30*  < > 2.11*  CALCIUM 8.9  < > 8.6*  AST 23  --   --   ALT 11*  --   --   ALKPHOS 81  --   --   BILITOT 0.3  --   --   < > = values in this interval not displayed. ------------------------------------------------------------------------------------------------------------------  Cardiac Enzymes  Recent Labs Lab 08/06/16 1534 08/06/16 2144  TROPONINI 0.17* 0.13*   ------------------------------------------------------------------------------------------------------------------  RADIOLOGY:  Dg Chest 2 View  Result Date: 08/10/2016 CLINICAL DATA:  Shortness of breath and hypoxia EXAM: CHEST  2 VIEW COMPARISON:  08/07/2016 FINDINGS:  Cardiac shadow is stable. Aortic calcifications are again seen. Azygos lobe is noted on the right. The right lung remains clear. Persistent small left pleural effusion and basilar atelectasis is noted. IMPRESSION: Persistent changes in the left lung base.  Electronically Signed   By: Alcide CleverMark  Lukens M.D.   On: 08/10/2016 14:32    ASSESSMENT AND PLAN:   Active Problems:   Acute respiratory failure (HCC)  * Acute hypoxic respi failure due to HCAP and acute systolic CHF   Initially required Bipap. Now on nasal canula oxygen   Responding to treatment with lasix. EF 45%   IV ceftriaxone- change to augmentin now- due to Ur cx report. ( to cover both UTI and pneumonia)   On room air now, encouraged to use incentive spirometry and have chest physical therapy to facilitate drainage of secretions.   Repeat Xray chest shows persistant finding of LLL PNA, but no worsening.  appreciated follow up by pulmonary.   Give total 10 days Abx- until 08/15/16.  * UTi   As per cx result- changed to augmentin.  * CKD   Stable, monitor now.  * Hyperglycemia   Started on SSi, but now blood sugar is stable, so d/c finger stick and sliding scale.  * hypertension   Cont amlodipine, coreg.  * hypothyroidism   Levothyroxine.   All the records are reviewed and case discussed with Care Management/Social Workerr. Management plans discussed with the patient, family and they are in agreement.  CODE STATUS: DNR  TOTAL TIME TAKING CARE OF THIS PATIENT: 35 minutes.   Discussed with patient's daughter was present in the room.  POSSIBLE D/C IN 1-2 DAYS, DEPENDING ON CLINICAL CONDITION.   [name], Amaris Garrette M.D on 08/10/2016   Between 7am to 6pm - Pager - [phone]  After 6pm go to [email or web address] - password [employer] [email or web address]  [facility] [place] [facility]  Office  [phone]  CC: Primary care physician; [name], [name], MD  Note: This dictation was prepared with Dragon dictation along with smaller phrase technology. Any transcriptional errors that result from this process are unintentional.

## 2016-08-10 NOTE — Progress Notes (Signed)
Pt has not had a BM since 08/05/16.  Order received for lactulose and dulcolax

## 2016-08-11 DIAGNOSIS — E43 Unspecified severe protein-calorie malnutrition: Secondary | ICD-10-CM | POA: Insufficient documentation

## 2016-08-11 LAB — CULTURE, BLOOD (ROUTINE X 2)
CULTURE: NO GROWTH
Culture: NO GROWTH

## 2016-08-11 MED ORDER — LEVOTHYROXINE SODIUM 150 MCG PO TABS
150.0000 ug | ORAL_TABLET | Freq: Every day | ORAL | Status: DC
  Administered 2016-08-12: 150 ug via ORAL
  Filled 2016-08-11: qty 1

## 2016-08-11 NOTE — Progress Notes (Signed)
Family Meeting Note  Advance Directive:yes  Today a meeting took place with the 2 daughters.  Patient is unable to participate due UJ:[id number]:Lacked capacity not well oriented, and not much communicating.   The following clinical team members were present during this meeting:MD  The following were discussed:Patient's diagnosis: Pneumonia, old age, malnutrition, decreased oral intake, dysphagia, complete dependent state at her baseline even before this pneumonia started. , Patient's progosis: < 6 months and Goals for treatment: DNR- discussed about gradual worsening and possible complications in near future and encouraged to involve. Her care and hospice in her care. Patient's daughters understood and agreed for that.  Additional follow-up to be provided: Palliative care consult.  Time spent during discussion:20 minutes  [name], Heath GoldVAIBHAVKUMAR, MD

## 2016-08-11 NOTE — Progress Notes (Signed)
Coughing noted after swallowing meds with water X2. May benefit from Speech eval. Windy Carinaurner,Lior Cartelli K, RN 6:02 AM 08/11/2016

## 2016-08-11 NOTE — Progress Notes (Signed)
Physical Therapy Treatment Patient Details Name: Melissa Bright MRN: [id number] DOB: 04/12/1924 Today's Date: 08/11/2016    History of Present Illness Patient is a 82 y.o. female admitted on 07 DEC for increasing SOB and hypoxia. Was on BiPAP and has weaned to 2L O2. Patient was admitted 12-17 NOV for acute anemia, secondary to GI bleed and STEMI. PMH includes CVA, renal disorder, dietary folate deficiency, anemia, CAD, arthritis, GERD, CKD IV, and chronic diastolic CHF.    PT Comments    Melissa Bright was alert and agreeable to therapy today.  She completed BUE/LE  therapeutic exercises and denied any pain with these (pt had reported Bil shoulder pain earlier in the day). She required mod assist +2 for supine>sit to elevate trunk and advance LEs.  She sat EOB for a total of 5 minutes requiring min guard>mod A due to fatigue.  Max +2 assist for sit>supine.  Pt demonstrated an improvement in activity tolerance today, PT will continue to follow acutely.    Follow Up Recommendations  No PT follow up     Equipment Recommendations       Recommendations for Other Services       Precautions / Restrictions Precautions Precautions: Fall Restrictions Weight Bearing Restrictions: No    Mobility  Bed Mobility Overal bed mobility: Needs Assistance Bed Mobility: Supine to Sit;Sit to Supine     Supine to sit: Mod assist;+2 for physical assistance;HOB elevated Sit to supine: Max assist   General bed mobility comments: Mod assist +2 to elevate trunk and advance LEs to EOB with cues provided to use bed rail to pull to sitting.  To return to supine pt required max assist.  Transfers                 General transfer comment: unable to attempt  Ambulation/Gait                 Stairs            Wheelchair Mobility    Modified Rankin (Stroke Patients Only)       Balance Overall balance assessment: Needs assistance Sitting-balance support: Feet supported;Single  extremity supported Sitting balance-Leahy Scale: Poor Sitting balance - Comments: Pt sat EOB ~5 minutes total.  She was able to maintain balance with 1UE supported and min guard for ~5 second bouts but otherwise required min>modA.  Attempted ipsilateral UE reaching which pt was able to do with much difficulty activating core to achieve, x2 each side.                            Cognition Arousal/Alertness: Awake/alert Behavior During Therapy: WFL for tasks assessed/performed Overall Cognitive Status: History of cognitive impairments - at baseline                      Exercises General Exercises - Upper Extremity Shoulder Flexion: AROM;Both;10 reps;Supine;Limitations Shoulder Flexion Limitations: Limited to ~ 80 deg shoulder F Bil, h/o OA (pt denies pain) General Exercises - Lower Extremity Ankle Circles/Pumps: AROM;Both;10 reps;Supine Heel Slides: AROM;Both;10 reps;Supine Hip ABduction/ADduction: AAROM;AROM;Both;10 reps;Supine    General Comments General comments (skin integrity, edema, etc.): Daughter present during session.      Pertinent Vitals/Pain Pain Assessment: No/denies pain    Home Living                      Prior Function  PT Goals (current goals can now be found in the care plan section) Acute Rehab PT Goals Patient Stated Goal: None stated PT Goal Formulation: With patient/family Time For Goal Achievement: 08/22/16 Potential to Achieve Goals: Fair Progress towards PT goals: Progressing toward goals    Frequency    Min 2X/week      PT Plan Current plan remains appropriate    Co-evaluation             End of Session   Activity Tolerance: Patient limited by fatigue Patient left: in bed;with call bell/phone within reach;with bed alarm set;with family/visitor present     Time: [id number] PT Time Calculation (min) (ACUTE ONLY): 24 min  Charges:  $Therapeutic Exercise: 8-22 mins $Therapeutic Activity: 8-22  mins                    G Codes:       Encarnacion ChuAshley Chellsea Beckers PT, DPT 08/11/2016, 12:52 PM

## 2016-08-11 NOTE — Care Management Important Message (Signed)
Important Message  Patient Details  Name: Melissa Bright MRN: [id number] Date of Birth: 06/30/1924   Medicare Important Message Given:  Yes    Chapman FitchBOWEN, Layonna Dobie T, RN 08/11/2016, 3:04 PM

## 2016-08-11 NOTE — Progress Notes (Signed)
Initial Nutrition Assessment  DOCUMENTATION CODES:   Severe malnutrition in context of acute illness/injury  INTERVENTION:  Provide Chocolate Mightyshake II po BID with lunch and dinner trays. Each supplement provides 500 kcal and 23 grams of protein.  Will monitor outcome of discussions regarding goals of care.  NUTRITION DIAGNOSIS:   Inadequate oral intake related to acute illness as evidenced by per patient/family report.  GOAL:   Patient will meet greater than or equal to 90% of their needs   MONITOR:   PO intake, Supplement acceptance, Labs, Weight trends, I & O's, Skin  REASON FOR ASSESSMENT:   Low Braden    ASSESSMENT:   82 yo female with a PMH of CVA, Renal Disorder, Dietary folate deficiency anemia (baseline Hgb 7.7), CAD, Arthritis, GERD, CKD stage IV, Chronic diastolic CHF, and Recent hospitalization 11/12-11/17 w/ acute anemia secondary to GI bleed and STEMI (too high risk for cardiac catheterization).  She [facility] ER 12/7 with c/o shortness of breath with hypoxia onset 12/7.   -Since assessment patient has been placed on Dysphagia 2 diet with Nectar-Thick Liquids. No SLP note yet. -Also noted there is a consult for Palliative Care to meet with family regarding hospice.  Spoke with patient's daughter at bedside. Patient had limited participation in conversation - mainly able to answer yes or no. Daughter reports patient's appetite has been poor since she has been here. She does not have nausea or abdominal pain. Patient with some difficulty chewing as she will not put her dentures in, but daughter reports she is cutting up food for her. Daughter reports patient has had poor intake since here (per chart mainly 50-100% meal completion) but at home had 3 meals per day (usually meat with beans and potatoes).   Daughter reports UBW of about 160 lbs and that patient has lost weight while here. Difficult to assess weight loss as patient is being diuresed and still  has significant edema, which is masking true weight.  Medications reviewed and include: Colace, Lasix 20 mg daily, levothyroxine, pantoprazole.  Labs reviewed (last on 12/11): Chloride 116, BUN 35, Creatinine 2.11.   Nutrition-Focused physical exam completed. Findings are no fat depletion, moderate muscle depletion, and moderate edema. Patient meets criteria for severe acute malnutrition in setting of moderate muscle depletion and moderate edema.  Discussed with RN.  Diet Order:  DIET DYS 2 Room service appropriate? Yes with Assist; Fluid consistency: Nectar Thick  Skin:  Wound (see comment) (MSAD to groin, perineum, sacrum)  Last BM:  08/11/2016  Height:   Ht Readings from Last 1 Encounters:  08/06/16 5\' 7"  (1.702 m)    Weight:   Wt Readings from Last 1 Encounters:  08/11/16 147 lb 4.8 oz (66.8 kg)    Ideal Body Weight:  61.4 kg  BMI:  Body mass index is 23.07 kg/m.  Estimated Nutritional Needs:   Kcal:  1350-1580 (MSJ x 1.2-1.4)  Protein:  80-94 grams (1.2-1.4 grams/kg)  Fluid:  per MD  EDUCATION NEEDS:   No education needs identified at this time  Helane RimaLeanne Tyrell Brereton, MS, RD, LDN Pager: [phone] After Hours Pager: [phone]

## 2016-08-11 NOTE — Care Management Important Message (Signed)
Important Message  Patient Details  Name: Melissa Bright MRN: [id number] Date of Birth: 12/05/1923   Medicare Important Message Given:  Yes    Jasmarie Coppock T, RN 08/11/2016, 3:04 PM 

## 2016-08-11 NOTE — Evaluation (Signed)
Clinical/Bedside Swallow Evaluation Patient Details  Name: Melissa Bright MRN: [id number] Date of Birth: 10/06/1923  Today's Date: 08/11/2016 Time: SLP Start Time (ACUTE ONLY): 1000 SLP Stop Time (ACUTE ONLY): 1100 SLP Time Calculation (min) (ACUTE ONLY): 60 min  Past Medical History:  Past Medical History:  Diagnosis Date  . Arthritis   . Blood transfusion without reported diagnosis   . CHF (congestive heart failure) (HCC)   . Coronary artery disease   . Hypertension   . Renal disorder   . Stroke [facility](HCC)    Past Surgical History:  Past Surgical History:  Procedure Laterality Date  . ABDOMINAL HYSTERECTOMY    . CARDIAC SURGERY    . EYE SURGERY    . JOINT REPLACEMENT     HPI:  Pt is a 82 y/o female w/ h/o CHF, recent MI- came with respi distress and was on Bipap- so in ICU care, now improved. On room air now but has complain of cough and significant secretions along with weakness.   Assessment / Plan / Recommendation Clinical Impression  Pt appears at increased risk for aspiration as evidenced by overt coughing w/ trials of thin liquids; mild throat clearing noted x1 w/ trials of Nectar liquids. With any po trial given, pt exhibited increased WOB and RR requiring time b/t trials to calm the breathing b/f taking another trial. Pt has congested coughing noted at baseline which only increases w/ the exertion from po trials. Pt exhibited a mildly wet vocal quality x1 post trials of thin liquids. Due to pt's presentation, no trials of increased solids were assessed at this evaluation. Also discussed w/ Daughter that pt may benefit from modifying the oral diet and a thickened liquid consistency in order to reduce risk for aspiration episodes possibly secondary to pt's baseline Pulmonary status currently. Daughter agreed. Recommend a Dysphagia 2 diet w/ Nectar liquids; aspiration precautions; meds in Puree w/ NSG.     Aspiration Risk  Mild aspiration risk (-Moderate aspiration risk)    Diet  Recommendation  Dysphagia level 2; Nectar consistency liquids. Aspiration precautions; feeding assistance at all meal.   Medication Administration: Whole meds with puree (but crushed if needed for easier swallowing)    Other  Recommendations Recommended Consults:  (Dietician consult) Oral Care Recommendations: Oral care BID;Staff/trained caregiver to provide oral care Other Recommendations: Order thickener from pharmacy;Prohibited food (jello, ice cream, thin soups);Remove water pitcher;Have oral suction available   Follow up Recommendations  (TBD)      Frequency and Duration min 3x week  2 weeks       Prognosis Prognosis for Safe Diet Advancement: Guarded (-Fair) Barriers to Reach Goals:  (TBD)      Swallow Study   General Date of Onset: 08/06/16 HPI: Pt is a 82 y/o female w/ h/o CHF, recent MI- came with respi distress and was on Bipap- so in ICU care, now improved. On room air now but has complain of cough and significant secretions along with weakness. Type of Study: Bedside Swallow Evaluation Previous Swallow Assessment: none indicated at this time Diet Prior to this Study: Regular;Thin liquids Temperature Spikes Noted: No (wbc 3.3) Respiratory Status: Room air History of Recent Intubation: No Behavior/Cognition: Alert;Cooperative;Pleasant mood;Distractible;Requires cueing;Confused Oral Cavity Assessment: Dry Oral Care Completed by SLP: Recent completion by staff Oral Cavity - Dentition: Edentulous Vision: Functional for self-feeding Self-Feeding Abilities: Needs assist;Needs set up;Total assist Patient Positioning: Upright in bed Baseline Vocal Quality: Low vocal intensity Volitional Cough: Congested;Weak (min) Volitional Swallow: Able to elicit  Oral/Motor/Sensory Function Overall Oral Motor/Sensory Function: Within functional limits (grossly)   Ice Chips Ice chips: Within functional limits Presentation: Spoon (2 trials)   Thin Liquid Thin Liquid:  Impaired Presentation: Cup;Self Fed;Straw (assisted; 8-9 trials total) Oral Phase Impairments:  (none) Oral Phase Functional Implications:  (none) Pharyngeal  Phase Impairments: Suspected delayed Swallow;Multiple swallows;Cough - Immediate;Cough - Delayed;Wet Vocal Quality (audible swallowing) Other Comments: often took multiple swallows unless instructed or monitored to take only 1 sip at a time    Nectar Thick Nectar Thick Liquid: Impaired Presentation: Self Fed;Straw (x4 trials) Oral Phase Impairments:  (none ) Oral phase functional implications:  (none) Pharyngeal Phase Impairments: Throat Clearing - Delayed (x1 w/ trials)   Honey Thick Honey Thick Liquid: Not tested   Puree Puree: Within functional limits Presentation: Spoon (fed; 3 trials)   Solid   GO   Solid: Not tested         Jerilynn SomKatherine Watson, MS, CCC-SLP Watson,Katherine 08/11/2016,3:52 PM

## 2016-08-11 NOTE — Care Management (Signed)
Patient admitted with respiratory distress. PCP [facility]. [facility].  Patient lives in er own home, and her 2 daughters rotates living in the home and providing care.  Patient has BSC, ift chair, shower chair, slide board, nebulizer and WC in the home.  [facility] with [facility].  Reported that [facility].  Patient's daughter Melissa Bright requesting Hospice services at discharge if appropriate, states that patient has been followed by Hospice at home in the past.  Melissa Bright aldo requesting a hospital bed.  Palliative consult pending.  RNCM following.

## 2016-08-11 NOTE — Progress Notes (Signed)
[facility] [place] [facility]   PATIENT NAME: Melissa GuilesMarie Bright    MR#:  [id number]  DATE OF BIRTH:  11/01/1923  SUBJECTIVE:  CHIEF COMPLAINT:   Chief Complaint  Patient presents with  . Respiratory Distress    Recent MI- came with respi distress and was on Bipap- so in ICU care, now improved. On room air now but has complain of cough and significant secretions along with weakness.  she appears significantly weak today, and not participating in conversations . Daughter in room. Not much improved in last few days.  REVIEW OF SYSTEMS:  CONSTITUTIONAL: No fever,positive for fatigue or weakness.  EYES: No blurred or double vision.  EARS, NOSE, AND THROAT: No tinnitus or ear pain.  RESPIRATORY: No cough, positive for shortness of breath, wheezing or hemoptysis.  CARDIOVASCULAR: No chest pain, orthopnea, edema.  GASTROINTESTINAL: No nausea, vomiting, diarrhea or abdominal pain.  GENITOURINARY: No dysuria, hematuria.  ENDOCRINE: No polyuria, nocturia,  HEMATOLOGY: No anemia, easy bruising or bleeding SKIN: No rash or lesion. MUSCULOSKELETAL: No joint pain or arthritis.   NEUROLOGIC: No tingling, numbness, weakness.  PSYCHIATRY: No anxiety or depression.   ROS  DRUG ALLERGIES:   Allergies  Allergen Reactions  . Macrobid [Nitrofurantoin Monohyd Macro]     VITALS:  Blood pressure (!) 126/56, pulse (!) 59, temperature 98.1 F (36.7 C), temperature source Oral, resp. rate 16, height 5\' 7"  (1.702 m), weight 66.8 kg (147 lb 4.8 oz), SpO2 95 %.  PHYSICAL EXAMINATION:  GENERAL:  82 y.o.-year-old patient lying in the bed with no acute distress.  EYES: Pupils equal, round, reactive to light and accommodation. No scleral icterus. Extraocular muscles intact.  HEENT: Head atraumatic, normocephalic. Oropharynx and nasopharynx clear.  NECK:  Supple, no jugular venous distention. No thyroid enlargement, no tenderness.  LUNGS: Normal breath sounds bilaterally, some Sounds of  secretion from upper airways. No use of accessory muscles of respiration. On supplemental oxygen. CARDIOVASCULAR: S1, S2 normal. No murmurs, rubs, or gallops.  ABDOMEN: Soft, nontender, nondistended. Bowel sounds present. No organomegaly or mass.  EXTREMITIES: No pedal edema, cyanosis, or clubbing.  NEUROLOGIC: Cranial nerves II through XII are intact except face slightly deviated to left side- as per daughter - she had old stroke and this is chronic. Muscle strength 3/5 in all extremities. Sensation intact. Gait not checked.  PSYCHIATRIC: The patient is alert and oriented x 2. Not participating much in communication. SKIN: No obvious rash, lesion, or ulcer.   Physical Exam LABORATORY PANEL:   CBC  Recent Labs Lab 08/10/16 0430  WBC 3.3*  HGB 9.1*  HCT 27.4*  PLT 195   ------------------------------------------------------------------------------------------------------------------  Chemistries   Recent Labs Lab 08/06/16 0851  08/10/16 0430  NA 142  < > 143  K 4.1  < > 3.8  CL 111  < > 116*  CO2 22  < > 22  GLUCOSE 201*  < > 82  BUN 34*  < > 35*  CREATININE 2.30*  < > 2.11*  CALCIUM 8.9  < > 8.6*  AST 23  --   --   ALT 11*  --   --   ALKPHOS 81  --   --   BILITOT 0.3  --   --   < > = values in this interval not displayed. ------------------------------------------------------------------------------------------------------------------  Cardiac Enzymes  Recent Labs Lab 08/06/16 1534 08/06/16 2144  TROPONINI 0.17* 0.13*   ------------------------------------------------------------------------------------------------------------------  RADIOLOGY:  Dg Chest 2 View  Result Date: 08/10/2016 CLINICAL DATA:  Shortness of breath and hypoxia EXAM: CHEST  2 VIEW COMPARISON:  08/07/2016 FINDINGS: Cardiac shadow is stable. Aortic calcifications are again seen. Azygos lobe is noted on the right. The right lung remains clear. Persistent small left pleural effusion and  basilar atelectasis is noted. IMPRESSION: Persistent changes in the left lung base. Electronically Signed   By: Alcide CleverMark  Lukens M.D.   On: 08/10/2016 14:32    ASSESSMENT AND PLAN:   Active Problems:   Acute respiratory failure (HCC)   Protein-calorie malnutrition, severe  * Acute hypoxic respi failure due to HCAP and acute systolic CHF   Initially required Bipap. Now on nasal canula oxygen   Responding to treatment with lasix. EF 45%   IV ceftriaxone- change to augmentin now- due to Ur cx report. ( to cover both UTI and pneumonia)   On room air now, encouraged to use incentive spirometry and have chest physical therapy to facilitate drainage of secretions.   Repeat Xray chest shows persistant finding of LLL PNA, but no worsening.  appreciated follow up by pulmonary.   Give total 10 days Abx- until 08/15/16.  * UTi   As per cx result- changed to augmentin.  * CKD   Stable, monitor now.  * Hyperglycemia   Started on SSi, but now blood sugar is stable, so d/c finger stick and sliding scale.  * hypertension   Cont amlodipine, coreg.  * hypothyroidism   Levothyroxine.   Checked TSH due to her persistent weakness and no much improvement- TSH is high- increase dose of levothyroxine.  * severe malnutrition   SLP eval done, due to old age and dependent status- prognosis is poor.  Today I had detailed discussion about prognosis, and involving pallaitive care.  All the records are reviewed and case discussed with Care Management/Social Workerr. Management plans discussed with the patient, family and they are in agreement.  CODE STATUS: DNR  TOTAL TIME TAKING CARE OF THIS PATIENT: 45 minutes.   Discussed with patient's daughter was present in the room.  POSSIBLE D/C IN 1-2 DAYS, DEPENDING ON CLINICAL CONDITION.   [name], Aubrey Blackard M.D on 08/11/2016   Between 7am to 6pm - Pager - [phone]  After 6pm go to [email or web address] - password [email or web address]  [facility] [place]  [facility]  Office  [phone]  CC: Primary care physician; [name], [name], MD  Note: This dictation was prepared with Dragon dictation along with smaller phrase technology. Any transcriptional errors that result from this process are unintentional.

## 2016-08-12 MED ORDER — FUROSEMIDE 20 MG PO TABS: 20.0000 mg | ORAL_TABLET | ORAL | 0 refills | Status: DC

## 2016-08-12 MED ORDER — AMOXICILLIN-POT CLAVULANATE 500-125 MG PO TABS: 500.0000 mg | ORAL_TABLET | Freq: Two times a day (BID) | ORAL | 0 refills | Status: DC

## 2016-08-12 NOTE — Progress Notes (Signed)
Update to previous note. Writer spoke with patient's daughter Eber JonesCarolyn in the room, advised her that the bed will be delivered after 3 pm today. She feel's that it is better for her mother to discharge "in the daylight" and feels that the family can transfer her from the lift chair to the bed after it arrives. Hospital care team updated. Thank you. Dayna BarkerKaren Robertson RN, BSN, [facility], hospital Liaison [phone] c

## 2016-08-12 NOTE — Progress Notes (Signed)
New referral for [facility] services at home following discharge received from [place] as well as Dr. Fritzi Mandes. Mrs. Melissa Bright is a 82 year old woman admitted to [facility] on 12/7 for treatment of acute respiratory failure requiring Bipap. She has received treatment for pneumonia and is currently on room air with scheduled nebulizer treatments. This is her 4th admission since July of this year, the first for respiratory failure.  Per chart note review her EF was 55-65% in March 2017, it now 45%. Her albumin is 2.7 and she has a poor appetite. She also recently suffered the loss of her son, who was significantly involved in her care at home.  Writer met in the room with patient's daughter Melissa Bright. Patient seen lying in bed, alert, able to converse, audible wheezing noted along with a productive cough. She denied pain or dyspnea. Melissa Bright is familiar with hospice services as [name] has had hospice services in the past. Plan is for discharge today via EMS. Patient will require a hospital bed and nebulizer machine. Hospital bed needs to be in place prior to patient arriving home. Melissa Bright has arranged for family to make room and equipment has been ordered for delivery as soon as possible. Hospital care team all aware. Updated information faxed to hospice referral. Signed DNR in place in patient's chart. Thank you for the opportunity to be involved in the care of this patient and her family. Flo Shanks RN, BSN, [facility] [place], [facility] [phone] c

## 2016-08-12 NOTE — Care Management (Signed)
Patient has been deemed appropriate for hospice at discharge.  [facility] have been [facility] in the past.  Dayna BarkerKaren Robertson Hospice Liaison aware.  I have notified Barbara CowerJason with [facility] of plan. Clydie BraunKaren is arranging hospital bed and nebulizer.  Plan for EMS transport at discharge.  RNCM signing off

## 2016-08-12 NOTE — Discharge Instructions (Signed)
° °  Community-Acquired Pneumonia, Adult Introduction Pneumonia is an infection of the lungs. One type of pneumonia can happen while a person is in a hospital. A different type can happen when a person is not in a hospital (community-acquired pneumonia). It is easy for this kind to spread from person to person. It can spread to you if you breathe near an infected person who coughs or sneezes. Some symptoms include:  A dry cough.  A wet (productive) cough.  Fever.  Sweating.  Chest pain. Follow these instructions at home:  Take over-the-counter and prescription medicines only as told by your doctor.  Only take cough medicine if you are losing sleep.  If you were prescribed an antibiotic medicine, take it as told by your doctor. Do not stop taking the antibiotic even if you start to feel better.  Sleep with your head and neck raised (elevated). You can do this by putting a few pillows under your head, or you can sleep in a recliner.  Do not use tobacco products. These include cigarettes, chewing tobacco, and e-cigarettes. If you need help quitting, ask your doctor.  Drink enough water to keep your pee (urine) clear or pale yellow. A shot (vaccine) can help prevent pneumonia. Shots are often suggested for:  People older than 80 years of age.  People older than 80 years of age:  Who are having cancer treatment.  Who have long-term (chronic) lung disease.  Who have problems with their body's defense system (immune system). You may also prevent pneumonia if you take these actions:  Get the flu (influenza) shot every year.  Go to the dentist as often as told.  Wash your hands often. If soap and water are not available, use hand sanitizer. Contact a doctor if:  You have a fever.  You lose sleep because your cough medicine does not help. Get help right away if:  You are short of breath and it gets worse.  You have more chest pain.  Your sickness gets worse. This is very  serious if:  You are an older adult.  Your body's defense system is weak.  You cough up blood. This information is not intended to replace advice given to you by your health care provider. Make sure you discuss any questions you have with your health care provider. Document Released: 02/03/2008 Document Revised: 01/23/2016 Document Reviewed: 12/12/2014  2017 [facility] appointment on September 01, 2016 at 10:00am with Clarisa Kindredina Hackney, FNP. Please call [phone] to reschedule.

## 2016-08-12 NOTE — Progress Notes (Signed)
08/12/2016 3:09 PM  BP 122/60   Pulse (!) 59   Temp 97.8 F (36.6 C) (Oral)   Resp 16   Ht 5\' 7"  (1.702 m)   Wt 67.6 kg (149 lb)   SpO2 93%   BMI 23.34 kg/m  Patient discharged per MD orders. Discharge instructions reviewed with daughter Eber JonesCarolyn and she verbalized understanding. IV removed per policy. Prescriptions discussed and to be picked up at pharmacy. Discharged via stretcher escorted by EMS.  Ron ParkerHerron, Revella Shelton D, RN

## 2016-08-12 NOTE — Plan of Care (Signed)
Problem: Education: Goal: Knowledge of [facility] Education information/materials will improve Outcome: Not Progressing Patient is confused.  Problem: Health Behavior/Discharge Planning: Goal: Ability to manage health-related needs will improve Outcome: Not Progressing Patient is confused.   

## 2016-08-12 NOTE — Discharge Summary (Signed)
[facility] [place] at [facility]   PATIENT NAME: Melissa GuilesMarie Bright    MR#:  [id number]  DATE OF BIRTH:  02/01/1924  DATE OF ADMISSION:  08/06/2016 ADMITTING PHYSICIAN: Shane CrutchPradeep Ramachandran, MD  DATE OF DISCHARGE: 08/12/16  PRIMARY CARE PHYSICIAN: [name], [name], MD    ADMISSION DIAGNOSIS:  Melena [K92.1] Flash pulmonary edema (HCC) [J81.0] Acute respiratory failure with hypoxia (HCC) [J96.01] HCAP (healthcare-associated pneumonia) [J18.9]  DISCHARGE DIAGNOSIS:   SECONDARY DIAGNOSIS:   Past Medical History:  Diagnosis Date  . Arthritis   . Blood transfusion without reported diagnosis   . CHF (congestive heart failure) (HCC)   . Coronary artery disease   . Hypertension   . Renal disorder   . Stroke [facility](HCC)     HOSPITAL COURSE:   * Acute hypoxic respi failure due to HCAP and acute systolic CHF   Initially required Bipap. Now on nasal canula oxygen   Responding to treatment with lasix. EF 45%   IV ceftriaxone- change to augmentin now- due to Ur cx report. ( to cover both UTI and pneumonia)   On room air now, encouraged to use incentive spirometry and have chest physical therapy to facilitate drainage of secretions.   Repeat Xray chest shows persistant finding of LLL PNA, but no worsening.  appreciated follow up by pulmonary.   Give total 10 days Abx- until 08/15/16. will set up nebulizer at home  * UTi   As per cx result- changed to augmentin.  * CKD   Stable, monitor now.  * Hyperglycemia   Started on SSi, but now blood sugar is stable, so d/c finger stick and sliding scale.  * hypertension   Cont amlodipine, coreg.  * hypothyroidism   Levothyroxine.   Checked TSH due to her persistent weakness and no much improvement- TSH is high- increase dose of levothyroxine.  * severe malnutrition   SLP eval done, due to old age and dependent status- prognosis is poor.  D/w dter in the room. Pt has had hospice services in the past and  will reconsider it given multitude of problems. Spoke with Clydie BraunKaren from hospice  D/c home later today   CONSULTS OBTAINED:  Treatment Team:  Enid Baasadhika Kalisetti, MD  DRUG ALLERGIES:   Allergies  Allergen Reactions  . Macrobid [Nitrofurantoin Monohyd Macro]     DISCHARGE MEDICATIONS:   Current Discharge Medication List    START taking these medications   Details  amoxicillin-clavulanate (AUGMENTIN) 500-125 MG tablet Take 1 tablet (500 mg total) by mouth 2 (two) times daily. Qty: 6 tablet, Refills: 0    furosemide (LASIX) 20 MG tablet Take 1 tablet (20 mg total) by mouth every other day. Qty: 30 tablet, Refills: 0      CONTINUE these medications which have NOT CHANGED   Details  amLODipine (NORVASC) 2.5 MG tablet Take 1 tablet (2.5 mg total) by mouth 2 (two) times daily. Qty: 30 tablet, Refills: 0    atorvastatin (LIPITOR) 40 MG tablet Take 40 mg by mouth daily.    carvedilol (COREG) 25 MG tablet Take 1 tablet (25 mg total) by mouth 2 (two) times daily. Qty: 60 tablet, Refills: 5    docusate sodium (COLACE) 100 MG capsule Take 100 mg by mouth 2 (two) times daily.    ferrous gluconate (FERGON) 240 (27 FE) MG tablet Take 1 tablet (240 mg total) by mouth daily.    folic acid (FOLVITE) 800 MCG tablet Take 400 mcg by mouth 2 (two) times daily as needed.  hydrALAZINE (APRESOLINE) 50 MG tablet Take 1 tablet (50 mg total) by mouth every 8 (eight) hours. Qty: 90 tablet, Refills: 5    ipratropium-albuterol (DUONEB) 0.5-2.5 (3) MG/3ML SOLN Take 3 mLs by nebulization every 4 (four) hours as needed. Qty: 360 mL, Refills: 0    isosorbide mononitrate (IMDUR) 30 MG 24 hr tablet Take 30 mg by mouth daily.    latanoprost (XALATAN) 0.005 % ophthalmic solution Place 1 drop into both eyes at bedtime.    levothyroxine (SYNTHROID, LEVOTHROID) 112 MCG tablet Take 112 mcg by mouth daily.    omega-3 acid ethyl esters (LOVAZA) 1 g capsule Take 1 g by mouth daily.    pantoprazole  (PROTONIX) 40 MG tablet Take 1 tablet (40 mg total) by mouth 2 (two) times daily. Qty: 60 tablet, Refills: 5    senna-docusate (SENOKOT-S) 8.6-50 MG tablet Take 1 tablet by mouth at bedtime as needed for mild constipation.    sodium bicarbonate 650 MG tablet Take 650 mg by mouth 2 (two) times daily.    vitamin B-12 (CYANOCOBALAMIN) 1000 MCG tablet Take 1,000 mcg by mouth daily.        If you experience worsening of your admission symptoms, develop shortness of breath, life threatening emergency, suicidal or homicidal thoughts you must seek medical attention immediately by calling 911 or calling your MD immediately  if symptoms less severe.  You Must read complete instructions/literature along with all the possible adverse reactions/side effects for all the Medicines you take and that have been prescribed to you. Take any new Medicines after you have completely understood and accept all the possible adverse reactions/side effects.   Please note  You were cared for by a hospitalist during your hospital stay. If you have any questions about your discharge medications or the care you received while you were in the hospital after you are discharged, you can call the unit and asked to speak with the hospitalist on call if the hospitalist that took care of you is not available. Once you are discharged, your primary care physician will handle any further medical issues. Please note that NO REFILLS for any discharge medications will be authorized once you are discharged, as it is imperative that you return to your primary care physician (or establish a relationship with a primary care physician if you do not have one) for your aftercare needs so that they can reassess your need for medications and monitor your lab values. Today   SUBJECTIVE   Some wheezing. Pleasantly confused  VITAL SIGNS:  Blood pressure (!) 160/58, pulse 71, temperature 98.1 F (36.7 C), temperature source Oral, resp. rate 18,  height 5\' 7"  (1.702 m), weight 67.6 kg (149 lb), SpO2 94 %.  I/O:   Intake/Output Summary (Last 24 hours) at 08/12/16 0839 Last data filed at 08/12/16 0631  Gross per 24 hour  Intake              120 ml  Output                0 ml  Net              120 ml    PHYSICAL EXAMINATION:  GENERAL:  82 y.o.-year-old patient lying in the bed with no acute distress.  EYES: Pupils equal, round, reactive to light and accommodation. No scleral icterus. Extraocular muscles intact.  HEENT: Head atraumatic, normocephalic. Oropharynx and nasopharynx clear.  NECK:  Supple, no jugular venous distention. No thyroid enlargement, no tenderness.  LUNGS:  Normal breath sounds bilaterally, + wheezing, no  rales,rhonchi or crepitation. No use of accessory muscles of respiration.  CARDIOVASCULAR: S1, S2 normal. No murmurs, rubs, or gallops.  ABDOMEN: Soft, non-tender, non-distended. Bowel sounds present. No organomegaly or mass.  EXTREMITIES: No pedal edema, cyanosis, or clubbing.  NEUROLOGIC: Cranial nerves II through XII are intact. Muscle strength 5/5 in all extremities. Sensation intact. Gait not checked.  PSYCHIATRIC: The patient is alert and oriented x 3.  SKIN: No obvious rash, lesion, or ulcer.   DATA REVIEW:   CBC   Recent Labs Lab 08/10/16 0430  WBC 3.3*  HGB 9.1*  HCT 27.4*  PLT 195    Chemistries   Recent Labs Lab 08/06/16 0851  08/10/16 0430  NA 142  < > 143  K 4.1  < > 3.8  CL 111  < > 116*  CO2 22  < > 22  GLUCOSE 201*  < > 82  BUN 34*  < > 35*  CREATININE 2.30*  < > 2.11*  CALCIUM 8.9  < > 8.6*  AST 23  --   --   ALT 11*  --   --   ALKPHOS 81  --   --   BILITOT 0.3  --   --   < > = values in this interval not displayed.  Microbiology Results   Recent Results (from the past 240 hour(s))  Blood Culture (routine x 2)     Status: None   Collection Time: 08/06/16  8:56 AM  Result Value Ref Range Status   Specimen Description BLOOD RIGHT AC  Final   Special Requests    Final    BOTTLES DRAWN AEROBIC AND ANAEROBIC AER ANA   Culture NO GROWTH 5 DAYS  Final   Report Status 08/11/2016 FINAL  Final  Blood Culture (routine x 2)     Status: None   Collection Time: 08/06/16  8:56 AM  Result Value Ref Range Status   Specimen Description BLOOD LEFT AC  Final   Special Requests   Final    BOTTLES DRAWN AEROBIC AND ANAEROBIC AER ANA   Culture NO GROWTH 5 DAYS  Final   Report Status 08/11/2016 FINAL  Final  Urine culture     Status: Abnormal   Collection Time: 08/06/16  8:56 AM  Result Value Ref Range Status   Specimen Description URINE, RANDOM  Final   Special Requests NONE  Final   Culture >=100,000 COLONIES/mL ENTEROCOCCUS FAECALIS (A)  Final   Report Status 08/08/2016 FINAL  Final   Organism ID, Bacteria ENTEROCOCCUS FAECALIS (A)  Final      Susceptibility   Enterococcus faecalis - MIC*    AMPICILLIN <=2 SENSITIVE Sensitive     LEVOFLOXACIN 1 SENSITIVE Sensitive     NITROFURANTOIN <=16 SENSITIVE Sensitive     VANCOMYCIN 1 SENSITIVE Sensitive     * >=100,000 COLONIES/mL ENTEROCOCCUS FAECALIS  MRSA PCR Screening     Status: None   Collection Time: 08/06/16  2:49 PM  Result Value Ref Range Status   MRSA by PCR NEGATIVE NEGATIVE Final    Comment:        The GeneXpert MRSA Assay (FDA approved for NASAL specimens only), is one component of a comprehensive MRSA colonization surveillance program. It is not intended to diagnose MRSA infection nor to guide or monitor treatment for MRSA infections.     RADIOLOGY:  Dg Chest 2 View  Result Date: 08/10/2016 CLINICAL DATA:  Shortness of  breath and hypoxia EXAM: CHEST  2 VIEW COMPARISON:  08/07/2016 FINDINGS: Cardiac shadow is stable. Aortic calcifications are again seen. Azygos lobe is noted on the right. The right lung remains clear. Persistent small left pleural effusion and basilar atelectasis is noted. IMPRESSION: Persistent changes in the left lung base. Electronically Signed   By:  Alcide CleverMark  Lukens M.D.   On: 08/10/2016 14:32     Management plans discussed with the patient, family and they are in agreement.  CODE STATUS:     Code Status Orders        Start     Ordered   08/06/16 1052  Do not attempt resuscitation (DNR)  Continuous    Question Answer Comment  In the event of cardiac or respiratory ARREST Do not call a "code blue"   In the event of cardiac or respiratory ARREST Do not perform Intubation, CPR, defibrillation or ACLS   In the event of cardiac or respiratory ARREST Use medication by any route, position, wound care, and other measures to relive pain and suffering. May use oxygen, suction and manual treatment of airway obstruction as needed for comfort.      08/06/16 1053    Code Status History    Date Active Date Inactive Code Status Order ID Comments User Context   08/06/2016  8:56 AM 08/06/2016 10:53 AM DNR [id number]  Willy EddyPatrick Robinson, MD ED   08/01/2016 10:37 PM 08/03/2016  6:57 PM DNR [id number]  Oralia Manisavid Willis, MD Inpatient   08/01/2016  9:27 PM 08/01/2016 10:37 PM DNR [id number]  Loleta Roseory Forbach, MD ED   07/12/2016  3:02 PM 07/17/2016 11:16 PM DNR [id number]  Gracelyn NurseJohn D Johnston, MD ED   07/12/2016 12:57 PM 07/12/2016  3:02 PM DNR [id number]  Willy EddyPatrick Robinson, MD ED   03/24/2016  8:08 PM 03/26/2016  7:35 PM DNR [id number]  Houston SirenVivek J Sainani, MD Inpatient   11/21/2015 11:30 AM 11/22/2015  6:31 PM DNR [id number]  Enedina FinnerSona Kalissa Grays, MD Inpatient   11/20/2015  1:12 PM 11/21/2015 11:30 AM Full Code [id number]  Katharina Caperima Vaickute, MD ED   11/09/2015  4:26 PM 11/10/2015  5:19 PM DNR [id number]  Houston SirenVivek J Sainani, MD Inpatient    Advance Directive Documentation   Flowsheet Row Most Recent Value  Type of Advance Directive  Out of facility DNR (pink MOST or yellow form)  Pre-existing out of facility DNR order (yellow form or pink MOST form)  Yellow form placed in chart (order not valid for inpatient use)  "MOST" Form in Place?  No data      TOTAL TIME TAKING CARE OF THIS PATIENT: 40 minutes.     Ashelyn Mccravy M.D on 08/12/2016 at 8:39 AM  Between 7am to 6pm - Pager - [phone] After 6pm go to [email or web address] - password [email or web address] [email or web address]  [facility] [place]  Office  [phone]  CC: Primary care physician; [name], [name], MD

## 2016-09-01 ENCOUNTER — Ambulatory Visit: Payer: Self-pay | Admitting: Family

## 2017-04-01 DIAGNOSIS — R6889 Other general symptoms and signs: Secondary | ICD-10-CM | POA: Diagnosis not present

## 2017-04-01 DIAGNOSIS — Z7401 Bed confinement status: Secondary | ICD-10-CM | POA: Diagnosis not present

## 2017-04-06 DIAGNOSIS — Z7401 Bed confinement status: Secondary | ICD-10-CM | POA: Diagnosis not present

## 2017-04-06 DIAGNOSIS — R6889 Other general symptoms and signs: Secondary | ICD-10-CM | POA: Diagnosis not present

## 2017-05-23 ENCOUNTER — Inpatient Hospital Stay
Admission: EM | Admit: 2017-05-23 | Discharge: 2017-05-25 | DRG: 291 | Disposition: A | Discharge status: Hospice | Attending: Internal Medicine | Admitting: Internal Medicine

## 2017-05-23 ENCOUNTER — Emergency Department

## 2017-05-23 DIAGNOSIS — I130 Hypertensive heart and chronic kidney disease with heart failure and stage 1 through stage 4 chronic kidney disease, or unspecified chronic kidney disease: Principal | ICD-10-CM | POA: Diagnosis present

## 2017-05-23 DIAGNOSIS — J9621 Acute and chronic respiratory failure with hypoxia: Secondary | ICD-10-CM

## 2017-05-23 DIAGNOSIS — R0602 Shortness of breath: Secondary | ICD-10-CM

## 2017-05-23 DIAGNOSIS — J189 Pneumonia, unspecified organism: Secondary | ICD-10-CM | POA: Diagnosis not present

## 2017-05-23 DIAGNOSIS — Z87891 Personal history of nicotine dependence: Secondary | ICD-10-CM | POA: Diagnosis not present

## 2017-05-23 DIAGNOSIS — Z66 Do not resuscitate: Secondary | ICD-10-CM | POA: Diagnosis present

## 2017-05-23 DIAGNOSIS — I959 Hypotension, unspecified: Secondary | ICD-10-CM | POA: Diagnosis not present

## 2017-05-23 DIAGNOSIS — N179 Acute kidney failure, unspecified: Secondary | ICD-10-CM | POA: Diagnosis present

## 2017-05-23 DIAGNOSIS — Z515 Encounter for palliative care: Secondary | ICD-10-CM | POA: Diagnosis present

## 2017-05-23 DIAGNOSIS — K922 Gastrointestinal hemorrhage, unspecified: Secondary | ICD-10-CM | POA: Diagnosis present

## 2017-05-23 DIAGNOSIS — I5023 Acute on chronic systolic (congestive) heart failure: Secondary | ICD-10-CM | POA: Diagnosis present

## 2017-05-23 DIAGNOSIS — F0390 Unspecified dementia without behavioral disturbance: Secondary | ICD-10-CM | POA: Diagnosis present

## 2017-05-23 DIAGNOSIS — N184 Chronic kidney disease, stage 4 (severe): Secondary | ICD-10-CM | POA: Diagnosis present

## 2017-05-23 DIAGNOSIS — I2510 Atherosclerotic heart disease of native coronary artery without angina pectoris: Secondary | ICD-10-CM | POA: Diagnosis present

## 2017-05-23 DIAGNOSIS — I10 Essential (primary) hypertension: Secondary | ICD-10-CM | POA: Diagnosis not present

## 2017-05-23 DIAGNOSIS — Z8673 Personal history of transient ischemic attack (TIA), and cerebral infarction without residual deficits: Secondary | ICD-10-CM

## 2017-05-23 DIAGNOSIS — J9601 Acute respiratory failure with hypoxia: Secondary | ICD-10-CM | POA: Diagnosis not present

## 2017-05-23 DIAGNOSIS — J810 Acute pulmonary edema: Secondary | ICD-10-CM

## 2017-05-23 DIAGNOSIS — Z7401 Bed confinement status: Secondary | ICD-10-CM | POA: Diagnosis not present

## 2017-05-23 DIAGNOSIS — D62 Acute posthemorrhagic anemia: Secondary | ICD-10-CM | POA: Diagnosis not present

## 2017-05-23 DIAGNOSIS — A419 Sepsis, unspecified organism: Secondary | ICD-10-CM

## 2017-05-23 DIAGNOSIS — Z9911 Dependence on respirator [ventilator] status: Secondary | ICD-10-CM | POA: Diagnosis not present

## 2017-05-23 DIAGNOSIS — R0600 Dyspnea, unspecified: Secondary | ICD-10-CM | POA: Diagnosis not present

## 2017-05-23 LAB — CBC WITH DIFFERENTIAL/PLATELET
BASOS ABS: 0 10*3/uL (ref 0–0.1)
Basophils Relative: 0 %
EOS PCT: 1 %
Eosinophils Absolute: 0.1 10*3/uL (ref 0–0.7)
HCT: 23.6 % — ABNORMAL LOW (ref 35.0–47.0)
Hemoglobin: 7.2 g/dL — ABNORMAL LOW (ref 12.0–16.0)
Lymphocytes Relative: 15 %
Lymphs Abs: 1.2 10*3/uL (ref 1.0–3.6)
MCH: 24.9 pg — ABNORMAL LOW (ref 26.0–34.0)
MCHC: 30.4 g/dL — ABNORMAL LOW (ref 32.0–36.0)
MCV: 81.7 fL (ref 80.0–100.0)
Monocytes Absolute: 0.2 10*3/uL (ref 0.2–0.9)
Monocytes Relative: 3 %
Neutro Abs: 6.8 10*3/uL — ABNORMAL HIGH (ref 1.4–6.5)
Neutrophils Relative %: 81 %
Platelets: 478 10*3/uL — ABNORMAL HIGH (ref 150–440)
RBC: 2.89 MIL/uL — AB (ref 3.80–5.20)
RDW: 18.9 % — ABNORMAL HIGH (ref 11.5–14.5)
WBC: 8.4 10*3/uL (ref 3.6–11.0)

## 2017-05-23 LAB — URINALYSIS, COMPLETE (UACMP) WITH MICROSCOPIC
Bacteria, UA: NONE SEEN
Bilirubin Urine: NEGATIVE
GLUCOSE, UA: 50 mg/dL — AB
HGB URINE DIPSTICK: NEGATIVE
KETONES UR: NEGATIVE mg/dL
LEUKOCYTES UA: NEGATIVE
NITRITE: NEGATIVE
PH: 5 (ref 5.0–8.0)
Protein, ur: 100 mg/dL — AB
Specific Gravity, Urine: 1.013 (ref 1.005–1.030)

## 2017-05-23 LAB — COMPREHENSIVE METABOLIC PANEL
ALK PHOS: 90 U/L (ref 38–126)
ALT: 10 U/L — ABNORMAL LOW (ref 14–54)
ANION GAP: 12 (ref 5–15)
AST: 28 U/L (ref 15–41)
Albumin: 2.5 g/dL — ABNORMAL LOW (ref 3.5–5.0)
BILIRUBIN TOTAL: 0.3 mg/dL (ref 0.3–1.2)
BUN: 36 mg/dL — ABNORMAL HIGH (ref 6–20)
CALCIUM: 8.9 mg/dL (ref 8.9–10.3)
CO2: 17 mmol/L — ABNORMAL LOW (ref 22–32)
Chloride: 110 mmol/L (ref 101–111)
Creatinine, Ser: 1.83 mg/dL — ABNORMAL HIGH (ref 0.44–1.00)
GFR calc non Af Amer: 23 mL/min — ABNORMAL LOW (ref >60)
GFR, EST AFRICAN AMERICAN: 27 mL/min — AB (ref >60)
Glucose, Bld: 324 mg/dL — ABNORMAL HIGH (ref 65–99)
Potassium: 4.7 mmol/L (ref 3.5–5.1)
SODIUM: 139 mmol/L (ref 135–145)
Total Protein: 7 g/dL (ref 6.5–8.1)

## 2017-05-23 LAB — MRSA PCR SCREENING: MRSA by PCR: POSITIVE — AB

## 2017-05-23 LAB — BLOOD GAS, VENOUS
ACID-BASE DEFICIT: 8.4 mmol/L — AB (ref 0.0–2.0)
BICARBONATE: 18.8 mmol/L — AB (ref 20.0–28.0)
O2 Saturation: 79.2 %
PATIENT TEMPERATURE: 37
PCO2 VEN: 46 mmHg (ref 44.0–60.0)
PH VEN: 7.22 — AB (ref 7.250–7.430)
PO2 VEN: 53 mmHg — AB (ref 32.0–45.0)

## 2017-05-23 LAB — GLUCOSE, CAPILLARY: Glucose-Capillary: 200 mg/dL — ABNORMAL HIGH (ref 65–99)

## 2017-05-23 LAB — LACTIC ACID, PLASMA
Lactic Acid, Venous: 3.7 mmol/L (ref 0.5–1.9)
Lactic Acid, Venous: 2.4 mmol/L (ref 0.5–1.9)

## 2017-05-23 LAB — TSH: TSH: 59 u[IU]/mL — ABNORMAL HIGH (ref 0.350–4.500)

## 2017-05-23 LAB — PROTIME-INR
INR: 1.01
PROTHROMBIN TIME: 13.2 s (ref 11.4–15.2)

## 2017-05-23 MED ORDER — CHLORHEXIDINE GLUCONATE CLOTH 2 % EX PADS
6.0000 | MEDICATED_PAD | Freq: Every day | CUTANEOUS | Status: DC
  Administered 2017-05-24: 6 via TOPICAL

## 2017-05-23 MED ORDER — SENNOSIDES-DOCUSATE SODIUM 8.6-50 MG PO TABS
1.0000 | ORAL_TABLET | Freq: Two times a day (BID) | ORAL | Status: DC
  Administered 2017-05-23 – 2017-05-25 (×4): 1 via ORAL
  Filled 2017-05-23 (×4): qty 1

## 2017-05-23 MED ORDER — TRIAMCINOLONE ACETONIDE 0.1 % EX CREA
1.0000 "application " | TOPICAL_CREAM | Freq: Two times a day (BID) | CUTANEOUS | Status: DC
  Administered 2017-05-24 – 2017-05-25 (×3): 1 via TOPICAL
  Filled 2017-05-23: qty 15

## 2017-05-23 MED ORDER — ALBUTEROL SULFATE (2.5 MG/3ML) 0.083% IN NEBU
2.5000 mg | INHALATION_SOLUTION | RESPIRATORY_TRACT | Status: DC
  Administered 2017-05-23: 2.5 mg via RESPIRATORY_TRACT
  Filled 2017-05-23: qty 3

## 2017-05-23 MED ORDER — ACETAMINOPHEN 650 MG RE SUPP
650.0000 mg | Freq: Four times a day (QID) | RECTAL | Status: DC | PRN
  Administered 2017-05-24: 650 mg via RECTAL
  Filled 2017-05-23: qty 1

## 2017-05-23 MED ORDER — IPRATROPIUM-ALBUTEROL 0.5-2.5 (3) MG/3ML IN SOLN
6.0000 mL | RESPIRATORY_TRACT | Status: AC
Start: 2017-05-23 — End: 2017-05-23
  Administered 2017-05-23: 6 mL via RESPIRATORY_TRACT

## 2017-05-23 MED ORDER — PIPERACILLIN-TAZOBACTAM 3.375 G IVPB 30 MIN
INTRAVENOUS | Status: AC
  Filled 2017-05-23: qty 50

## 2017-05-23 MED ORDER — HYDRALAZINE HCL 20 MG/ML IJ SOLN
10.0000 mg | INTRAMUSCULAR | Status: DC | PRN
  Administered 2017-05-23: 20 mg via INTRAVENOUS
  Filled 2017-05-23: qty 1

## 2017-05-23 MED ORDER — ONDANSETRON HCL 4 MG/2ML IJ SOLN
INTRAMUSCULAR | Status: AC
  Filled 2017-05-23: qty 2

## 2017-05-23 MED ORDER — FUROSEMIDE 10 MG/ML IJ SOLN
60.0000 mg | Freq: Four times a day (QID) | INTRAMUSCULAR | Status: DC
  Administered 2017-05-23: 60 mg via INTRAVENOUS
  Filled 2017-05-23: qty 6

## 2017-05-23 MED ORDER — FUROSEMIDE 10 MG/ML IJ SOLN
INTRAMUSCULAR | Status: AC
  Filled 2017-05-23: qty 4

## 2017-05-23 MED ORDER — ALBUTEROL SULFATE (2.5 MG/3ML) 0.083% IN NEBU
2.5000 mg | INHALATION_SOLUTION | Freq: Four times a day (QID) | RESPIRATORY_TRACT | Status: DC
  Administered 2017-05-23 – 2017-05-25 (×9): 2.5 mg via RESPIRATORY_TRACT
  Filled 2017-05-23 (×9): qty 3

## 2017-05-23 MED ORDER — HYDRALAZINE HCL 50 MG PO TABS
25.0000 mg | ORAL_TABLET | Freq: Three times a day (TID) | ORAL | Status: DC
  Administered 2017-05-24: 25 mg via ORAL
  Filled 2017-05-23: qty 1

## 2017-05-23 MED ORDER — CARVEDILOL 3.125 MG PO TABS
6.2500 mg | ORAL_TABLET | Freq: Two times a day (BID) | ORAL | Status: DC
  Administered 2017-05-23 – 2017-05-25 (×4): 6.25 mg via ORAL
  Filled 2017-05-23 (×2): qty 1
  Filled 2017-05-23 (×2): qty 2

## 2017-05-23 MED ORDER — IPRATROPIUM-ALBUTEROL 0.5-2.5 (3) MG/3ML IN SOLN
RESPIRATORY_TRACT | Status: AC
  Administered 2017-05-23: 6 mL via RESPIRATORY_TRACT
  Filled 2017-05-23: qty 6

## 2017-05-23 MED ORDER — ONDANSETRON HCL 4 MG PO TABS: 4.0000 mg | ORAL_TABLET | Freq: Four times a day (QID) | ORAL | Status: DC | PRN

## 2017-05-23 MED ORDER — NITROGLYCERIN 2 % TD OINT
0.5000 [in_us] | TOPICAL_OINTMENT | Freq: Four times a day (QID) | TRANSDERMAL | Status: DC
  Administered 2017-05-23 – 2017-05-25 (×8): 0.5 [in_us] via TOPICAL
  Filled 2017-05-23 (×8): qty 1

## 2017-05-23 MED ORDER — FUROSEMIDE 10 MG/ML IJ SOLN
40.0000 mg | Freq: Two times a day (BID) | INTRAMUSCULAR | Status: DC
  Administered 2017-05-23 – 2017-05-25 (×4): 40 mg via INTRAVENOUS
  Filled 2017-05-23 (×4): qty 4

## 2017-05-23 MED ORDER — PIPERACILLIN-TAZOBACTAM 3.375 G IVPB: 3.3750 g | Freq: Two times a day (BID) | INTRAVENOUS | Status: DC

## 2017-05-23 MED ORDER — ONDANSETRON HCL 4 MG/2ML IJ SOLN: 4.0000 mg | Freq: Four times a day (QID) | INTRAMUSCULAR | Status: DC | PRN

## 2017-05-23 MED ORDER — FUROSEMIDE 10 MG/ML IJ SOLN
80.0000 mg | INTRAMUSCULAR | Status: AC
  Administered 2017-05-23: 80 mg via INTRAVENOUS
  Filled 2017-05-23: qty 8

## 2017-05-23 MED ORDER — ALBUTEROL SULFATE (2.5 MG/3ML) 0.083% IN NEBU: 2.5000 mg | INHALATION_SOLUTION | RESPIRATORY_TRACT | Status: DC | PRN

## 2017-05-23 MED ORDER — PIPERACILLIN-TAZOBACTAM 3.375 G IVPB 30 MIN
3.3750 g | Freq: Once | INTRAVENOUS | Status: AC
  Administered 2017-05-23: 3.375 g via INTRAVENOUS
  Filled 2017-05-23: qty 50

## 2017-05-23 MED ORDER — MUPIROCIN 2 % EX OINT
1.0000 "application " | TOPICAL_OINTMENT | Freq: Two times a day (BID) | CUTANEOUS | Status: DC
  Administered 2017-05-23 – 2017-05-25 (×5): 1 via NASAL
  Filled 2017-05-23: qty 22

## 2017-05-23 MED ORDER — LABETALOL HCL 5 MG/ML IV SOLN
10.0000 mg | INTRAVENOUS | Status: DC | PRN
  Administered 2017-05-23: 10 mg via INTRAVENOUS
  Filled 2017-05-23: qty 4

## 2017-05-23 MED ORDER — ACETAMINOPHEN 325 MG PO TABS: 650.0000 mg | ORAL_TABLET | Freq: Four times a day (QID) | ORAL | Status: DC | PRN

## 2017-05-23 MED ORDER — ONDANSETRON HCL 4 MG/2ML IJ SOLN
4.0000 mg | Freq: Once | INTRAMUSCULAR | Status: AC
  Administered 2017-05-23: 4 mg via INTRAVENOUS

## 2017-05-23 MED ORDER — SODIUM CHLORIDE 0.9 % IV SOLN: INTRAVENOUS | Status: DC

## 2017-05-23 MED ORDER — VANCOMYCIN HCL IN DEXTROSE 1-5 GM/200ML-% IV SOLN
1000.0000 mg | Freq: Once | INTRAVENOUS | Status: AC
  Administered 2017-05-23: 1000 mg via INTRAVENOUS

## 2017-05-23 MED ORDER — INFLUENZA VAC SPLIT HIGH-DOSE 0.5 ML IM SUSY
0.5000 mL | PREFILLED_SYRINGE | INTRAMUSCULAR | Status: DC
  Filled 2017-05-23: qty 0.5

## 2017-05-23 MED ORDER — MORPHINE SULFATE (PF) 2 MG/ML IV SOLN: 2.0000 mg | INTRAVENOUS | Status: DC | PRN

## 2017-05-23 MED ORDER — ORAL CARE MOUTH RINSE: 15.0000 mL | Freq: Two times a day (BID) | OROMUCOSAL | Status: DC

## 2017-05-23 MED ORDER — VANCOMYCIN HCL IN DEXTROSE 750-5 MG/150ML-% IV SOLN: 750.0000 mg | INTRAVENOUS | Status: DC

## 2017-05-23 MED ORDER — HEPARIN SODIUM (PORCINE) 5000 UNIT/ML IJ SOLN
5000.0000 [IU] | Freq: Three times a day (TID) | INTRAMUSCULAR | Status: DC
  Administered 2017-05-23 – 2017-05-24 (×3): 5000 [IU] via SUBCUTANEOUS
  Filled 2017-05-23 (×4): qty 1

## 2017-05-23 NOTE — Progress Notes (Signed)
[facility] [place] at [facility]   PATIENT NAME: Melissa Bright    MR#:  [id number]  DATE OF BIRTH:  May 07, 1924  SUBJECTIVE:  CHIEF COMPLAINT:   Chief Complaint  Patient presents with  . Respiratory Distress   On hospice at home. For last 2 days worsening SOB, and hospice nurse could not keep her comfortable, so sent to ER> Noted to be in respi failure,started on Bipap- Off now, on nasal canula, alert, and no distress. REVIEW OF SYSTEMS:  Due to acquity of illness, and respi failure and some dementia, not able to give ROS.  ROS  DRUG ALLERGIES:   Allergies  Allergen Reactions  . Macrobid [employer] Macro] Other (See Comments)    Reaction: weakness and altered mental status    VITALS:  Blood pressure (!) 176/84, pulse 70, temperature 98.3 F (36.8 C), temperature source Axillary, resp. rate 15, height  (1.6 m), weight 67.7 kg (149 lb 4 oz), SpO2 100 %.  PHYSICAL EXAMINATION:  GENERAL:  83 y.o.-year-old patient lying in the bed with no acute distress.  EYES: Pupils equal, round, reactive to light and accommodation. No scleral icterus. Extraocular muscles intact.  HEENT: Head atraumatic, normocephalic. Oropharynx and nasopharynx clear.  NECK:  Supple, no jugular venous distention. No thyroid enlargement, no tenderness.  LUNGS: Normal breath sounds bilaterally, no wheezing, bilateral crepitation. No use of accessory muscles of respiration.  CARDIOVASCULAR: S1, S2 normal. No murmurs, rubs, or gallops.  ABDOMEN: Soft, nontender, nondistended. Bowel sounds present. No organomegaly or mass.  EXTREMITIES:some pedal edema,no cyanosis, or clubbing.  NEUROLOGIC: Cranial nerves II through XII are intact. Muscle strength 2-3/5 in all extremities. Sensation intact. Gait not checked.  PSYCHIATRIC: The patient is alert and oriented x 1.  SKIN: No obvious rash, lesion, or ulcer.   Physical Exam LABORATORY PANEL:   CBC  Recent Labs Lab 05/23/17 0408   WBC 8.4  HGB 7.2*  HCT 23.6*  PLT 478*   ------------------------------------------------------------------------------------------------------------------  Chemistries   Recent Labs Lab 05/23/17 0408  NA 139  K 4.7  CL 110  CO2 17*  GLUCOSE 324*  BUN 36*  CREATININE 1.83*  CALCIUM 8.9  AST 28  ALT 10*  ALKPHOS 90  BILITOT 0.3   ------------------------------------------------------------------------------------------------------------------  Cardiac Enzymes No results for input(s): TROPONINI in the last 168 hours. ------------------------------------------------------------------------------------------------------------------  RADIOLOGY:  Dg Chest Port 1 View  Result Date: 05/23/2017 CLINICAL DATA:  Acute onset shortness of breath. EXAM: PORTABLE CHEST 1 VIEW COMPARISON:  08/10/2016 FINDINGS: Normal heart size and pulmonary vascularity. Shallow inspiration. Bilateral perihilar and basilar infiltrates, greater on the right. Findings may indicate edema or multifocal pneumonia. Suggestion of small bilateral pleural effusions. No pneumothorax. Azygos lobe. Calcified aorta. Degenerative changes in the shoulders. Vascular stent in the right axilla. IMPRESSION: Perihilar and basilar infiltrates greater on the right may indicate multifocal pneumonia or edema. Small bilateral pleural effusions. Electronically Signed   By: Burman Nieves M.D.   On: 05/23/2017 05:12    ASSESSMENT AND PLAN:   Active Problems:   Acute on chronic respiratory failure with hypoxia (HCC)   * Ac on ch respi failure   Due to Pulmonary edema   Ac on ch systolic CHF   Pneumonia suspected, but ruled out.    IV lasix. Bipap, now on nasla canula.    High risk of worsening in respi status.    She was on hospice.   I will call palliative care to Re-Decide the goals of treatment.  *  Hypertension: uncontrolled; consider spironolactone.   nitro paste on her chest     Improved after addressing respi  distress and giving IV lasix. * DVT prophylaxis: heparin * GI prophylaxis: none   All the records are reviewed and case discussed with Care Management/Social Workerr. Management plans discussed with the patient, family and they are in agreement.  CODE STATUS: DNR  TOTAL TIME TAKING CARE OF THIS PATIENT: 35 minutes.   POSSIBLE D/C IN 1-2 DAYS, DEPENDING ON CLINICAL CONDITION.   Altamese Dilling M.D on 05/23/2017   Between 7am to 6pm - Pager - [phone]  After 6pm go to [email or web address] - password [email or web address]  [facility] [place] [facility]  Office  [phone]  CC: Primary care physician; [facility], [facility]  Note: This dictation was prepared with Dragon dictation along with smaller phrase technology. Any transcriptional errors that result from this process are unintentional.

## 2017-05-23 NOTE — Progress Notes (Signed)
Decreased to 2l, which pt wears at home.

## 2017-05-23 NOTE — ED Notes (Signed)
Critical lactic acid of 3.2 called from lab. Dr. Manson Passey notified, no new orders received.

## 2017-05-23 NOTE — Progress Notes (Signed)
Pharmacy Antibiotic Note  Melissa Bright is a 83 y.o. female admitted on 05/23/2017 with sepsis.  Pharmacy has been consulted for vancomycin and Zosyn dosing.  Plan: DW 56kg  Vd 39L kei 0.019 hr-1  T1/2 36 hours Vancomycin 750 mg q 36 hours ordered with stacked dosing. Level before 4th dose. Goal trough 15-20.  Zosyn 3.375 grams q 12 hours ordered.  Height:  (157.5 cm) Weight: 140 lb (63.5 kg) IBW/kg (Calculated) : 50.1  Temp (24hrs), Avg:97.9 F (36.6 C), Min:97.9 F (36.6 C), Max:97.9 F (36.6 C)   Recent Labs Lab 05/23/17 0408  WBC 8.4  CREATININE 1.83*  LATICACIDVEN 3.7*    Estimated Creatinine Clearance: 17.5 mL/min (A) (by C-G formula based on SCr of 1.83 mg/dL (H)).    Allergies  Allergen Reactions  . Macrobid [employer] Macro] Other (See Comments)    Reaction: weakness and altered mental status    Antimicrobials this admission: Vancomycin, Zosyn 9/23  >>    >>   Dose adjustments this admission:   Microbiology results: 9/23 BCx: pending      9/23 UA: pending 9/23 CXR: Perihilar and basilar infiltrates greater on the right may indicate multifocal pneumonia or edema.  Thank you for allowing pharmacy to be a part of this patient's care.  Shatima Zalar S 05/23/2017 5:45 AM

## 2017-05-23 NOTE — Consult Note (Signed)
[facility] Medicine Consultation    SYNOPSIS   83 yo female enrolled in hospice, now comes into the hospital for uncontrolled dyspnea likely due to pulmonary edema.   ASSESSMENT/PLAN   Acute respiratory failure with dyspnea from acute pulmonary edema.  Questionable pneumonia, no elevated WBC or procalcitonin.  AKI.   --Wean down/off bipap.  --Continue aggressive diuresis.  --Morphine as needed for symptom management.  --Palliative care consultation.  ---------------------------------------  ---------------------------------------   Name: DAJAI WAHLERT MRN: [id number] DOB: 03/15/1924    ADMISSION DATE:  05/23/2017 CONSULTATION DATE:  05/23/17  REFERRING MD :  Dr. Sheryle Hail  CHIEF COMPLAINT:  dyspnea   HISTORY OF PRESENT ILLNESS:   The patient is a 83 yo female, she is currently on bipap, and can not provide history, therefore all history is obtained from the chart, and from staff, and pt's family member at bedside. The patient's eldest daughter, Alphonsa Overall is at bedside, she has been very involved with her mother's care since around November when she was apparently enrolled in hospice.  The patient is bedbound in a hospital bed in her own home, she can swallow but is completely dependent on others for care which is provided by 3 daughters and hospice nurses. The patient was briefly admitted to hospice house in July to give the daughters a break.  The daughter notes that the patient was not on lasix, and she not know why, but she notes that the patient's breathing got worse over the past few days. Yesterday she was seen by the hospice nurse and was given morphine but symptoms were not controlled, so hospice nurse called the ambulance.   Currently the daughter is upset that her mother was not on lasix, she would like her mother put back on lasix so that her breathing will be better with the goal of going back home. Currently the patient is on bipap, I explained  that this is a form of life support. Daughter acknowledges that the patient would not want to be kept alive and would rather pass away, she is tearful and is having trouble letting go, she alternates between her mother's wishes and her own wish to get her mother back to home.   Images personally reviewed; review of most recent CXR shows RLL infiltrates, possible pulm edema.   Echo 08/02/16; EF=45%  PAST MEDICAL HISTORY :  Past Medical History:  Diagnosis Date  . Arthritis   . Blood transfusion without reported diagnosis   . CHF (congestive heart failure) (HCC)   . Coronary artery disease   . Hypertension   . Renal disorder   . Stroke [facility])    Past Surgical History:  Procedure Laterality Date  . ABDOMINAL HYSTERECTOMY    . CARDIAC SURGERY    . EYE SURGERY    . JOINT REPLACEMENT     Prior to Admission medications   Medication Sig Start Date End Date Taking? Authorizing Provider  senna-docusate (SENOKOT-S) 8.6-50 MG tablet Take 1 tablet by mouth 2 (two) times daily.    Yes [provider]  triamcinolone cream (KENALOG) 0.1 % Apply 1 application topically 2 (two) times daily.   Yes [provider]   Allergies  Allergen Reactions  . Macrobid [employer] Macro] Other (See Comments)    Reaction: weakness and altered mental status    FAMILY HISTORY:  Family History  Problem Relation Age of Onset  . Heart attack Father    SOCIAL HISTORY:  reports that she has quit  smoking. She has never used smokeless tobacco. She reports that she does not drink alcohol or use drugs.  REVIEW OF SYSTEMS:   Could not be provided due to critical illness.    VITAL SIGNS: Temp:  [97.7 F (36.5 C)-97.9 F (36.6 C)] 97.7 F (36.5 C) (09/23 0650) Pulse Rate:  [82-105] 82 (09/23 0700) Resp:  [13-33] 13 (09/23 0700) BP: (136-200)/(57-72) 195/57 (09/23 0700) SpO2:  [99 %-100 %] 100 % (09/23 0700) FiO2 (%):  [50 %] 50 % (09/23 0440) Weight:  [140 lb (63.5 kg)-149 lb 4  oz (67.7 kg)] 149 lb 4 oz (67.7 kg) (09/23 0650) HEMODYNAMICS:   VENTILATOR SETTINGS: FiO2 (%):  [50 %] 50 % INTAKE / OUTPUT:  Intake/Output Summary (Last 24 hours) at 05/23/17 0734 Last data filed at 05/23/17 0533  Gross per 24 hour  Intake              250 ml  Output                0 ml  Net              250 ml    Physical Examination:   VS: BP (!) 195/57   Pulse 82   Temp 97.7 F (36.5 C) (Axillary)   Resp 13   Ht  (1.6 m)   Wt 149 lb 4 oz (67.7 kg)   SpO2 100%   BMI 26.44 kg/m   General Appearance: No distress on bipap.  Neuro:without focal findings, mental status reduced.  HEENT: PERRLA, EOM intact, no ptosis, no other lesions noticed;  Pulmonary: scattered rhonchi.  CardiovascularNormal S1,S2.  No m/r/g.    Abdomen: Benign, Soft, non-tender, No masses, hepatosplenomegaly, No lymphadenopathy Renal:  No costovertebral tenderness  GU:  Not performed at this time. Endoc: No evident thyromegaly, no signs of acromegaly. Skin:   warm, no rashes, no ecchymosis  Extremities: normal, no cyanosis, clubbing, no edema, warm with normal capillary refill.    LABS: Reviewed   LABORATORY PANEL:   CBC  Recent Labs Lab 05/23/17 0408  WBC 8.4  HGB 7.2*  HCT 23.6*  PLT 478*    Chemistries   Recent Labs Lab 05/23/17 0408  NA 139  K 4.7  CL 110  CO2 17*  GLUCOSE 324*  BUN 36*  CREATININE 1.83*  CALCIUM 8.9  AST 28  ALT 10*  ALKPHOS 90  BILITOT 0.3     Recent Labs Lab 05/23/17 0652  GLUCAP 200*   No results for input(s): PHART, PCO2ART, PO2ART in the last 168 hours.  Recent Labs Lab 05/23/17 0408  AST 28  ALT 10*  ALKPHOS 90  BILITOT 0.3  ALBUMIN 2.5*    Cardiac Enzymes No results for input(s): TROPONINI in the last 168 hours.  RADIOLOGY:  Dg Chest Port 1 View  Result Date: 05/23/2017 CLINICAL DATA:  Acute onset shortness of breath. EXAM: PORTABLE CHEST 1 VIEW COMPARISON:  08/10/2016 FINDINGS: Normal heart size and pulmonary  vascularity. Shallow inspiration. Bilateral perihilar and basilar infiltrates, greater on the right. Findings may indicate edema or multifocal pneumonia. Suggestion of small bilateral pleural effusions. No pneumothorax. Azygos lobe. Calcified aorta. Degenerative changes in the shoulders. Vascular stent in the right axilla. IMPRESSION: Perihilar and basilar infiltrates greater on the right may indicate multifocal pneumonia or edema. Small bilateral pleural effusions. Electronically Signed   By: Burman Nieves M.D.   On: 05/23/2017 05:12       --[name], MD.  Board Certified in  Internal Medicine, Pulmonary Medicine, Critical Care Medicine, and Sleep Medicine.  ICU Pager [phone] [place] [facility] Office Number: [phone]  Santiago Glad, M.D.  Billy Fischer, M.D   05/23/2017, 7:34 AM

## 2017-05-23 NOTE — ED Notes (Signed)
Dr. Sheryle Hail notified of continued htn, no new orders received.

## 2017-05-23 NOTE — ED Provider Notes (Signed)
[facility] Emergency Department Provider Note   First MD Initiated Contact with Patient 05/23/17 [phone]     (approximate)  I have reviewed the triage vital signs and the nursing notes.  Level 5 caveat: Dementia, respiratory distress HISTORY  Chief Complaint Respiratory Distress   HPI Melissa Bright is a 83 y.o. female with below list of chronic medical conditionscurrently receiving care from hospice presents to the emergency department via EMS with respiratory distressthat has progressively worsened over the course of the night per EMS. Patient presents to the emergency department with nonrebreather in place setting 89% with apparent respiratory distress increased work of breathing.   Past Medical History:  Diagnosis Date  . Arthritis   . Blood transfusion without reported diagnosis   . CHF (congestive heart failure) (HCC)   . Coronary artery disease   . Hypertension   . Renal disorder   . Stroke [facility])     Patient Active Problem List   Diagnosis Date Noted  . Protein-calorie malnutrition, severe 08/11/2016  . Acute respiratory failure (HCC) 08/06/2016  . NSTEMI (non-ST elevated myocardial infarction) (HCC) 08/01/2016  . GI bleed 07/12/2016  . Acute posthemorrhagic anemia 03/26/2016  . GIB (gastrointestinal bleeding) 03/26/2016  . Acute on chronic renal failure (HCC) 03/26/2016  . Pyuria 03/26/2016  . Essential hypertension, malignant 03/26/2016  . Dementia with behavioral disturbance 03/26/2016  . Symptomatic anemia 03/24/2016  . Chronic diastolic congestive heart failure (HCC)   . Respiratory failure with hypoxia (HCC) 11/20/2015  . COPD (chronic obstructive pulmonary disease) (HCC) 11/20/2015  . Left lower lobe pneumonia (HCC) 11/20/2015  . Leukocytosis 11/20/2015  . Malignant essential hypertension 11/20/2015  . CKD (chronic kidney disease), stage IV (HCC) 11/20/2015  . Anemia of chronic disease 11/20/2015  . Acute respiratory failure with  hypoxia (HCC) 11/20/2015  . Accelerated hypertension 11/09/2015    Past Surgical History:  Procedure Laterality Date  . ABDOMINAL HYSTERECTOMY    . CARDIAC SURGERY    . EYE SURGERY    . JOINT REPLACEMENT      Prior to Admission medications   Medication Sig Start Date End Date Taking? Authorizing Provider  senna-docusate (SENOKOT-S) 8.6-50 MG tablet Take 1 tablet by mouth 2 (two) times daily.    Yes [provider]  triamcinolone cream (KENALOG) 0.1 % Apply 1 application topically 2 (two) times daily.   Yes [provider]    Allergies Macrobid [nitrofurantoin monohyd macro]  Family History  Problem Relation Age of Onset  . Heart attack Father     Social History Social History  Substance Use Topics  . Smoking status: Former Games developer  . Smokeless tobacco: Never Used  . Alcohol use No    Review of Systems Constitutional: No fever/chills Eyes: No visual changes. ENT: No sore throat. Cardiovascular: Denies chest pain. Respiratory: positive for dyspnea and respiratory distress Gastrointestinal: No abdominal pain.  No nausea, no vomiting.  No diarrhea.  No constipation. Genitourinary: Negative for dysuria. Musculoskeletal: Negative for neck pain.  Negative for back pain. Integumentary: Negative for rash. Neurological: Negative for headaches, focal weakness or numbness.   ____________________________________________   PHYSICAL EXAM:  VITAL SIGNS: ED Triage Vitals  Enc Vitals Group     BP 05/23/17 0403 (!) 163/65     Pulse Rate 05/23/17 0403 (!) 105     Resp 05/23/17 0403 (!) 30     Temp 05/23/17 0403 97.9 F (36.6 C)     Temp Source 05/23/17 0403 Axillary  SpO2 05/23/17 0403 100 %     Weight 05/23/17 0406 63.5 kg (140 lb)     Height 05/23/17 0406 1.575 m ( )     Head Circumference --      Peak Flow --      Pain Score --      Pain Loc --      Pain Edu? --      Excl. in GC? --     Constitutional: Alert and ill-appearing. Apparent  respiratory distress. Eyes: Conjunctivae are normal. Head: Atraumatic. Mouth/Throat: Mucous membranes are moist.  Oropharynx non-erythematous. Neck: No stridor.   Cardiovascular: Normal rate, regular rhythm. Good peripheral circulation. Grossly normal heart sounds. Respiratory: tachypnea, positive accessory respiratory muscle use, diffuse rhonchi bibasilar Gastrointestinal: Soft and nontender. No distention.  Musculoskeletal: No lower extremity tenderness nor edema. No gross deformities of extremities. Skin:  Skin is warm, dry and intact. No rash noted. Psychiatric: Mood and affect are normal. Speech and behavior are normal.  ____________________________________________   LABS (all labs ordered are listed, but only abnormal results are displayed)  Labs Reviewed  COMPREHENSIVE METABOLIC PANEL - Abnormal; Notable for the following:       Result Value   CO2 17 (*)    Glucose, Bld 324 (*)    BUN 36 (*)    Creatinine, Ser 1.83 (*)    Albumin 2.5 (*)    ALT 10 (*)    GFR calc non Af Amer 23 (*)    GFR calc Af Amer 27 (*)    All other components within normal limits  LACTIC ACID, PLASMA - Abnormal; Notable for the following:    Lactic Acid, Venous 3.7 (*)    All other components within normal limits  CBC WITH DIFFERENTIAL/PLATELET - Abnormal; Notable for the following:    RBC 2.89 (*)    Hemoglobin 7.2 (*)    HCT 23.6 (*)    MCH 24.9 (*)    MCHC 30.4 (*)    RDW 18.9 (*)    Platelets 478 (*)    Neutro Abs 6.8 (*)    All other components within normal limits  BLOOD GAS, VENOUS - Abnormal; Notable for the following:    pH, Ven 7.22 (*)    pO2, Ven 53.0 (*)    Bicarbonate 18.8 (*)    Acid-base deficit 8.4 (*)    All other components within normal limits  CULTURE, BLOOD (ROUTINE X 2)  CULTURE, BLOOD (ROUTINE X 2)  PROTIME-INR  LACTIC ACID, PLASMA  URINALYSIS, COMPLETE (UACMP) WITH MICROSCOPIC   ____________________________________________  EKG  ED ECG REPORT I, [place]  N BROWN, the attending physician, personally viewed and interpreted this ECG.   Date: 05/23/2017  EKG Time: 4:06 AM  Rate: 105  Rhythm: sinus tachycardia  Axis: normal  Intervals:normal  ST&T Change: none  ____________________________________________  RADIOLOGY I, [place] Dewayne Shorter, personally viewed and evaluated these images (plain radiographs) as part of my medical decision making, as well as reviewing the written report by the radiologist.  Dg Chest Port 1 View  Result Date: 05/23/2017 CLINICAL DATA:  Acute onset shortness of breath. EXAM: PORTABLE CHEST 1 VIEW COMPARISON:  08/10/2016 FINDINGS: Normal heart size and pulmonary vascularity. Shallow inspiration. Bilateral perihilar and basilar infiltrates, greater on the right. Findings may indicate edema or multifocal pneumonia. Suggestion of small bilateral pleural effusions. No pneumothorax. Azygos lobe. Calcified aorta. Degenerative changes in the shoulders. Vascular stent in the right axilla. IMPRESSION: Perihilar and basilar infiltrates greater on the right  may indicate multifocal pneumonia or edema. Small bilateral pleural effusions. Electronically Signed   By: Burman Nieves M.D.   On: 05/23/2017 05:12    ____________________________________________   PROCEDURES  Critical Care performed: CRITICAL CARE Performed by: Darci Current   Total critical care time: 45 minutes  Critical care time was exclusive of separately billable procedures and treating other patients.  Critical care was necessary to treat or prevent imminent or life-threatening deterioration.  Critical care was time spent personally by me on the following activities: development of treatment plan with patient and/or surrogate as well as nursing, discussions with consultants, evaluation of patient's response to treatment, examination of patient, obtaining history from patient or surrogate, ordering and performing treatments and interventions, ordering and  review of laboratory studies, ordering and review of radiographic studies, pulse oximetry and re-evaluation of patient's condition.  Procedures   ____________________________________________   INITIAL IMPRESSION / ASSESSMENT AND PLAN / ED COURSE  Pertinent labs & imaging results that were available during my care of the patient were reviewed by me and considered in my medical decision making (see chart for details).   83 year old female presenting with above stated history of physical exam consistent with acute respiratory failure. BiPAP applied with 2 DuoNeb's administered immediately. Sepsis protocol initiated patient received IV vancomycin and Zosyn. Patient discussed with Dr. Sheryle Hail for hospital admission further evaluation and management.     ____________________________________________  FINAL CLINICAL IMPRESSION(S) / ED DIAGNOSES  Final diagnoses:  Shortness of breath     MEDICATIONS GIVEN DURING THIS VISIT:  Medications  piperacillin-tazobactam (ZOSYN) 3-0.375 GM/50ML IVPB (not administered)  vancomycin (VANCOCIN) IVPB 750 mg/150 ml premix (not administered)  piperacillin-tazobactam (ZOSYN) IVPB 3.375 g (not administered)  ipratropium-albuterol (DUONEB) 0.5-2.5 (3) MG/3ML nebulizer solution 6 mL (6 mLs Nebulization Given 05/23/17 0413)  piperacillin-tazobactam (ZOSYN) IVPB 3.375 g (0 g Intravenous Stopped 05/23/17 0507)  vancomycin (VANCOCIN) IVPB 1000 mg/200 mL premix (0 mg Intravenous Stopped 05/23/17 0533)  furosemide (LASIX) injection 80 mg (80 mg Intravenous Given 05/23/17 0527)     NEW OUTPATIENT MEDICATIONS STARTED DURING THIS VISIT:  New Prescriptions   No medications on file    Modified Medications   No medications on file    Discontinued Medications   AMLODIPINE (NORVASC) 2.5 MG TABLET    Take 1 tablet (2.5 mg total) by mouth 2 (two) times daily.   AMOXICILLIN-CLAVULANATE (AUGMENTIN) 500-125 MG TABLET    Take 1 tablet (500 mg total) by mouth 2 (two)  times daily.   ATORVASTATIN (LIPITOR) 40 MG TABLET    Take 40 mg by mouth daily.   AZELASTINE (ASTELIN) 0.1 % NASAL SPRAY    Place 1 spray into both nostrils daily. Use in each nostril as directed    AZELASTINE HCL 0.15 % SOLN    Place 1 spray into both nostrils daily.   CARVEDILOL (COREG) 25 MG TABLET    Take 1 tablet (25 mg total) by mouth 2 (two) times daily.   CARVEDILOL (COREG) 6.25 MG TABLET    Take 6.25 mg by mouth 2 (two) times daily.   CYANOCOBALAMIN 1000 MCG TABLET    Take 1,000 mcg by mouth daily.   DOCUSATE SODIUM (COLACE) 100 MG CAPSULE    Take 100 mg by mouth 2 (two) times daily.   DOCUSATE SODIUM (COLACE) 100 MG CAPSULE    Take 100 mg by mouth 2 (two) times daily.   FERROUS GLUCONATE (FERGON) 240 (27 FE) MG TABLET    Take 1 tablet (240 mg  total) by mouth daily.   FOLIC ACID (FOLVITE) 800 MCG TABLET    Take 800 mcg by mouth daily.    FUROSEMIDE (LASIX) 20 MG TABLET    Take 1 tablet (20 mg total) by mouth every other day.   HYDRALAZINE (APRESOLINE) 50 MG TABLET    Take 1 tablet (50 mg total) by mouth every 8 (eight) hours.   IPRATROPIUM-ALBUTEROL (DUONEB) 0.5-2.5 (3) MG/3ML SOLN    Take 3 mLs by nebulization every 4 (four) hours as needed.   ISOSORBIDE MONONITRATE (IMDUR) 30 MG 24 HR TABLET    Take 30 mg by mouth daily.   LATANOPROST (XALATAN) 0.005 % OPHTHALMIC SOLUTION    Place 1 drop into both eyes at bedtime.   LEVOTHYROXINE (SYNTHROID, LEVOTHROID) 112 MCG TABLET    Take 112 mcg by mouth daily.   MULTIPLE VITAMIN (MULTIVITAMIN WITH MINERALS) TABS TABLET    Take 1 tablet by mouth daily.   OMEGA-3 ACID ETHYL ESTERS (LOVAZA) 1 G CAPSULE    Take 1 g by mouth daily.   PANTOPRAZOLE (PROTONIX) 40 MG TABLET    Take 1 tablet (40 mg total) by mouth 2 (two) times daily.   SODIUM BICARBONATE 650 MG TABLET    Take 650 mg by mouth 2 (two) times daily.   VITAMIN B-12 (CYANOCOBALAMIN) 1000 MCG TABLET    Take 1,000 mcg by mouth daily.     Note:  This document was prepared using Dragon voice  recognition software and may include unintentional dictation errors.    Darci Current, MD 05/23/17 [phone]

## 2017-05-23 NOTE — Progress Notes (Signed)
Placed patient on 3l [place] at this time, RN and MD notified, tol well at this time, will continue to monitor.

## 2017-05-23 NOTE — ED Notes (Signed)
Pt with improved resp distress after application of bipap.

## 2017-05-23 NOTE — ED Notes (Signed)
Out of Zosyn in department. Pharmacy called to notify of need for zosyn and code sepsis.

## 2017-05-23 NOTE — Progress Notes (Signed)
Sats remain 100%, Decreased to 30%.

## 2017-05-23 NOTE — ED Notes (Signed)
Pt is nauseated, order for zofran received.

## 2017-05-23 NOTE — H&P (Signed)
Melissa Bright is an 83 y.o. female.   Chief Complaint: shortness of breath HPI: the patient with past medical history of CHF, HTN, arthritis and stroke presents to the emergency department due to shortness of breath. The patient's daughters state that she was in her usual state of health until tonight when see called her daughter for help who found her to be dyspneic and listless. In the Emergency Department the patient was placed in BiPAP due to increased work of breathing. CXR showed possible RLL pneumonia and the patient was started on broad spectrum antibiotics before the hospitalist service was called for admission.   Past Medical History:  Diagnosis Date  . Arthritis   . Blood transfusion without reported diagnosis   . CHF (congestive heart failure) ([place])   . Coronary artery disease   . Hypertension   . Renal disorder   . Stroke [facility])     Past Surgical History:  Procedure Laterality Date  . ABDOMINAL HYSTERECTOMY    . CARDIAC SURGERY    . EYE SURGERY    . JOINT REPLACEMENT      Family History  Problem Relation Age of Onset  . Heart attack Father    Social History:  reports that she has quit smoking. She has never used smokeless tobacco. She reports that she does not drink alcohol or use drugs.  Allergies:  Allergies  Allergen Reactions  . Macrobid [employer] Macro] Other (See Comments)    Reaction: weakness and altered mental status    Medications Prior to Admission  Medication Sig Dispense Refill  . senna-docusate (SENOKOT-S) 8.6-50 MG tablet Take 1 tablet by mouth 2 (two) times daily.     Marland Kitchen triamcinolone cream (KENALOG) 0.1 % Apply 1 application topically 2 (two) times daily.      Results for orders placed or performed during the hospital encounter of 05/23/17 (from the past 48 hour(s))  Comprehensive metabolic panel     Status: Abnormal   Collection Time: 05/23/17  4:08 AM  Result Value Ref Range   Sodium 139 135 - 145 mmol/L   Potassium 4.7 3.5 -  5.1 mmol/L   Chloride 110 101 - 111 mmol/L   CO2 17 (L) 22 - 32 mmol/L   Glucose, Bld 324 (H) 65 - 99 mg/dL   BUN 36 (H) 6 - 20 mg/dL   Creatinine, Ser 1.83 (H) 0.44 - 1.00 mg/dL   Calcium 8.9 8.9 - 10.3 mg/dL   Total Protein 7.0 6.5 - 8.1 g/dL   Albumin 2.5 (L) 3.5 - 5.0 g/dL   AST 28 15 - 41 U/L   ALT 10 (L) 14 - 54 U/L   Alkaline Phosphatase 90 38 - 126 U/L   Total Bilirubin 0.3 0.3 - 1.2 mg/dL   GFR calc non Af Amer 23 (L) >60 mL/min   GFR calc Af Amer 27 (L) >60 mL/min    Comment: (NOTE) The eGFR has been calculated using the CKD EPI equation. This calculation has not been validated in all clinical situations. eGFR's persistently <60 mL/min signify possible Chronic Kidney Disease.    Anion gap 12 5 - 15  Lactic acid, plasma     Status: Abnormal   Collection Time: 05/23/17  4:08 AM  Result Value Ref Range   Lactic Acid, Venous 3.7 (HH) 0.5 - 1.9 mmol/L    Comment: CRITICAL RESULT CALLED TO, READ BACK BY AND VERIFIED WITH APRIL BRUMGARD AT 7078 05/23/17.PMH  CBC with Differential     Status: Abnormal  Collection Time: 05/23/17  4:08 AM  Result Value Ref Range   WBC 8.4 3.6 - 11.0 K/uL   RBC 2.89 (L) 3.80 - 5.20 MIL/uL   Hemoglobin 7.2 (L) 12.0 - 16.0 g/dL   HCT 23.6 (L) 35.0 - 47.0 %   MCV 81.7 80.0 - 100.0 fL   MCH 24.9 (L) 26.0 - 34.0 pg   MCHC 30.4 (L) 32.0 - 36.0 g/dL   RDW 18.9 (H) 11.5 - 14.5 %   Platelets 478 (H) 150 - 440 K/uL   Neutrophils Relative % 81 %   Neutro Abs 6.8 (H) 1.4 - 6.5 K/uL   Lymphocytes Relative 15 %   Lymphs Abs 1.2 1.0 - 3.6 K/uL   Monocytes Relative 3 %   Monocytes Absolute 0.2 0.2 - 0.9 K/uL   Eosinophils Relative 1 %   Eosinophils Absolute 0.1 0 - 0.7 K/uL   Basophils Relative 0 %   Basophils Absolute 0.0 0 - 0.1 K/uL  Protime-INR     Status: None   Collection Time: 05/23/17  4:08 AM  Result Value Ref Range   Prothrombin Time 13.2 11.4 - 15.2 seconds   INR 1.01   Blood gas, venous     Status: Abnormal   Collection Time:  05/23/17  4:08 AM  Result Value Ref Range   pH, Ven 7.22 (L) 7.250 - 7.430   pCO2, Ven 46 44.0 - 60.0 mmHg   pO2, Ven 53.0 (H) 32.0 - 45.0 mmHg   Bicarbonate 18.8 (L) 20.0 - 28.0 mmol/L   Acid-base deficit 8.4 (H) 0.0 - 2.0 mmol/L   O2 Saturation 79.2 %   Patient temperature 37.0    Collection site LINE    Sample type VENOUS   Urinalysis, Complete w Microscopic     Status: Abnormal   Collection Time: 05/23/17  4:20 AM  Result Value Ref Range   Color, Urine YELLOW (A) YELLOW   APPearance HAZY (A) CLEAR   Specific Gravity, Urine 1.013 1.005 - 1.030   pH 5.0 5.0 - 8.0   Glucose, UA 50 (A) NEGATIVE mg/dL   Hgb urine dipstick NEGATIVE NEGATIVE   Bilirubin Urine NEGATIVE NEGATIVE   Ketones, ur NEGATIVE NEGATIVE mg/dL   Protein, ur 100 (A) NEGATIVE mg/dL   Nitrite NEGATIVE NEGATIVE   Leukocytes, UA NEGATIVE NEGATIVE   RBC / HPF 0-5 0 - 5 RBC/hpf   WBC, UA 6-30 0 - 5 WBC/hpf   Bacteria, UA NONE SEEN NONE SEEN   Squamous Epithelial / LPF 0-5 (A) NONE SEEN   Mucus PRESENT    Hyaline Casts, UA PRESENT    Amorphous Crystal PRESENT   Glucose, capillary     Status: Abnormal   Collection Time: 05/23/17  6:52 AM  Result Value Ref Range   Glucose-Capillary 200 (H) 65 - 99 mg/dL   Comment 1 Notify RN    Comment 2 Document in Chart    Dg Chest Port 1 View  Result Date: 05/23/2017 CLINICAL DATA:  Acute onset shortness of breath. EXAM: PORTABLE CHEST 1 VIEW COMPARISON:  08/10/2016 FINDINGS: Normal heart size and pulmonary vascularity. Shallow inspiration. Bilateral perihilar and basilar infiltrates, greater on the right. Findings may indicate edema or multifocal pneumonia. Suggestion of small bilateral pleural effusions. No pneumothorax. Azygos lobe. Calcified aorta. Degenerative changes in the shoulders. Vascular stent in the right axilla. IMPRESSION: Perihilar and basilar infiltrates greater on the right may indicate multifocal pneumonia or edema. Small bilateral pleural effusions.  Electronically Signed   By: Gwyndolyn Saxon  Gerilyn Nestle M.D.   On: 05/23/2017 05:12    Review of Systems  Constitutional: Negative for chills and fever.  HENT: Negative for sore throat and tinnitus.   Eyes: Negative for blurred vision and redness.  Respiratory: Positive for shortness of breath and wheezing. Negative for cough.   Cardiovascular: Negative for chest pain, palpitations, orthopnea and PND.  Gastrointestinal: Negative for abdominal pain, diarrhea, nausea and vomiting.  Genitourinary: Negative for dysuria, frequency and urgency.  Musculoskeletal: Negative for joint pain and myalgias.  Skin: Negative for rash.       No lesions  Neurological: Negative for speech change, focal weakness and weakness.  Endo/Heme/Allergies: Does not bruise/bleed easily.       No temperature intolerance  Psychiatric/Behavioral: Negative for depression and suicidal ideas.    Blood pressure (!) 198/69, pulse 95, temperature 97.7 F (36.5 C), temperature source Axillary, resp. rate 16, height 5' 3"  (1.6 m), weight 67.7 kg (149 lb 4 oz), SpO2 100 %. Physical Exam  Vitals reviewed. Constitutional: She is oriented to person, place, and time. She appears well-developed and well-nourished. She appears distressed.  HENT:  Head: Normocephalic and atraumatic.  Mouth/Throat: Oropharynx is clear and moist.  Eyes: Pupils are equal, round, and reactive to light. Conjunctivae and EOM are normal. No scleral icterus.  Neck: Normal range of motion. Neck supple. No JVD present. No tracheal deviation present. No thyromegaly present.  Cardiovascular: Normal rate, regular rhythm and normal heart sounds.  Exam reveals no gallop and no friction rub.   No murmur heard. Respiratory: She is in respiratory distress. She has decreased breath sounds in the right lower field.  GI: Soft. Bowel sounds are normal. She exhibits no distension. There is no tenderness.  Genitourinary:  Genitourinary Comments: Deferred  Musculoskeletal: Normal  range of motion. She exhibits no edema.  Lymphadenopathy:    She has no cervical adenopathy.  Neurological: She is alert and oriented to person, place, and time. No cranial nerve deficit. She exhibits normal muscle tone.  Skin: Skin is warm and dry. No rash noted. No erythema.  Psychiatric: She has a normal mood and affect. Her behavior is normal. Judgment and thought content normal.     Assessment/Plan This is a 83 year old female admitted for acute respiratory failure.  1. Respiratory failure: acute; with hypoxia. Multifactoral due to CHF and pneumonia. Continue BiPAP for work of breathing. Supplemental O2 as needed.  2. CAP: the patient is on home care hospice. May narrow antiobiotic coverage 3. CHF: systolic; acute on chronic. Last EF 45%. Some Kerley B lines on CXR. The patient's medications were discontinued more than 1 week ago which included Lasix. I have given her an IV dose and may continue to due so based on clinical improvement.  4. Hypertension: uncontrolled; consider spironolactone. I have placed nitro paste on her chest for now. 5. DVT prophylaxis: heparin 6. GI prophylaxis: none The patient is a DO NOT RESUSCITATE. Time spent on admission orders and critical care approximately 45 minutes. Discussed with with ICU attending.   Harrie Foreman, MD 05/23/2017, 7:08 AM

## 2017-05-23 NOTE — ED Notes (Signed)
Pt with improved resp status on bipap. Family at bedside and updated on care plan.

## 2017-05-23 NOTE — Progress Notes (Signed)
Decreased to 40% FIO2.

## 2017-05-24 LAB — CBC
HCT: 19.1 % — ABNORMAL LOW (ref 35.0–47.0)
HCT: 20.2 % — ABNORMAL LOW (ref 35.0–47.0)
HEMOGLOBIN: 6 g/dL — AB (ref 12.0–16.0)
Hemoglobin: 6.4 g/dL — ABNORMAL LOW (ref 12.0–16.0)
MCH: 25.1 pg — AB (ref 26.0–34.0)
MCH: 25.5 pg — AB (ref 26.0–34.0)
MCHC: 31.6 g/dL — ABNORMAL LOW (ref 32.0–36.0)
MCHC: 31.5 g/dL — AB (ref 32.0–36.0)
MCV: 79.5 fL — ABNORMAL LOW (ref 80.0–100.0)
MCV: 81.2 fL (ref 80.0–100.0)
PLATELETS: 373 10*3/uL (ref 150–440)
Platelets: 344 10*3/uL (ref 150–440)
RBC: 2.4 MIL/uL — AB (ref 3.80–5.20)
RBC: 2.49 MIL/uL — ABNORMAL LOW (ref 3.80–5.20)
RDW: 19.1 % — ABNORMAL HIGH (ref 11.5–14.5)
RDW: 19.8 % — ABNORMAL HIGH (ref 11.5–14.5)
WBC: 7 10*3/uL (ref 3.6–11.0)
WBC: 7.4 10*3/uL (ref 3.6–11.0)

## 2017-05-24 LAB — BASIC METABOLIC PANEL
Anion gap: 7 (ref 5–15)
BUN: 44 mg/dL — AB (ref 6–20)
CHLORIDE: 110 mmol/L (ref 101–111)
CO2: 24 mmol/L (ref 22–32)
CREATININE: 1.86 mg/dL — AB (ref 0.44–1.00)
Calcium: 9 mg/dL (ref 8.9–10.3)
GFR calc Af Amer: 26 mL/min — ABNORMAL LOW (ref >60)
GFR calc non Af Amer: 23 mL/min — ABNORMAL LOW (ref >60)
Glucose, Bld: 113 mg/dL — ABNORMAL HIGH (ref 65–99)
Potassium: 4.7 mmol/L (ref 3.5–5.1)
SODIUM: 141 mmol/L (ref 135–145)

## 2017-05-24 LAB — PREPARE RBC (CROSSMATCH)

## 2017-05-24 MED ORDER — ENSURE ENLIVE PO LIQD
237.0000 mL | Freq: Three times a day (TID) | ORAL | Status: DC
  Administered 2017-05-24 – 2017-05-25 (×3): 237 mL via ORAL

## 2017-05-24 MED ORDER — SODIUM CHLORIDE 0.9 % IV SOLN
Freq: Once | INTRAVENOUS | Status: AC
  Administered 2017-05-24: 16:00:00 via INTRAVENOUS

## 2017-05-24 NOTE — Consult Note (Signed)
  [facility] Critical Care Medicine Consultation   SYNOPSIS   83 yo female enrolled in hospice, now comes into the hospital for uncontrolled dyspnea likely due to pulmonary edema.  resp failure resolved, OK to transfer to gen med floor  ASSESSMENT/PLAN   Acute respiratory failure with dyspnea from acute pulmonary edema-resolved Questionable pneumonia, no elevated WBC or procalcitonin.  AKI.   --Continue aggressive diuresis.  --Morphine as needed for symptom management.  --Palliative care consultation.   ---------------------------------------  ---------------------------------------   Name: QUINCEE GITTENS MRN: [id number] DOB: 01-03-1924    ADMISSION DATE:  05/23/2017 CONSULTATION DATE:  05/23/17  REFERRING MD :  Dr. Sheryle Hail  CHIEF COMPLAINT:  dyspnea   HISTORY OF PRESENT ILLNESS:   Alert and awake, NAD Off biPAP +dementia, pleasant  Echo 08/02/16; EF=45%  REVIEW OF SYSTEMS:   Alert and awake NAD ROS limited due to dementia  Intake/Output Summary (Last 24 hours) at 05/24/17 0828 Last data filed at 05/24/17 0600  Gross per 24 hour  Intake                0 ml  Output             1350 ml  Net            -1350 ml    Physical Examination:   VS: BP (!) 151/47   Pulse 75   Temp 97.6 F (36.4 C) (Oral)   Resp 12   Ht  (1.6 m)   Wt 145 lb 15.1 oz (66.2 kg)   SpO2 100%   BMI 25.85 kg/m   General Appearance: No distress  Neuro:without focal findings, mental status reduced.  HEENT: PERRLA, EOM intact, no ptosis, no other lesions noticed;  Pulmonary: scattered rhonchi.  CardiovascularNormal S1,S2.  No m/r/g.    LABS: Reviewed   LABORATORY PANEL:   CBC  Recent Labs Lab 05/24/17 0413  WBC 7.0  HGB 6.0*  HCT 19.1*  PLT 344    Chemistries   Recent Labs Lab 05/23/17 0408 05/24/17 0413  NA 139 141  K 4.7 4.7  CL 110 110  CO2 17* 24  GLUCOSE 324* 113*  BUN 36* 44*  CREATININE 1.83* 1.86*  CALCIUM 8.9 9.0  AST 28  --   ALT 10*  --     ALKPHOS 90  --   BILITOT 0.3  --      Recent Labs Lab 05/23/17 0652  GLUCAP 200*   No results for input(s): PHART, PCO2ART, PO2ART in the last 168 hours.  Recent Labs Lab 05/23/17 0408  AST 28  ALT 10*  ALKPHOS 90  BILITOT 0.3  ALBUMIN 2.5*      Anup Brigham Santiago Glad, M.D.  [facility]  Medical Director [facility] Medical Director [facility] Cardio-Pulmonary Department

## 2017-05-24 NOTE — Progress Notes (Signed)
Informed NP that AM labs showed H/H down to 6.0/19.1. NP will put in order for blood transfusion 1 unit.

## 2017-05-24 NOTE — Progress Notes (Signed)
Visit made. Patient is currently followed by [facility], with a diagnosis of Hypertensive heart and chronic kidney disease w/heart failure.  She lives with her daughter [name] and is a DNR, with out of facility DNR in place. Daughter [name] is also involved in her care. Patient was seen by the on call hospice RN for uncontrolled dyspnea. Patient was treated at home with both lorazepam and liquid morphine. Her daughters chose to have her evaluated at the hospital rather than continue symptom management at home or the hospice home. She required Bipap for a short time and did receive IV antibiotics for possible right lower lobe pneumonia. She also received a Tylenol suppository over night.  Patient seen lying in bed, alert, oxygen at 2 liters via nasal cannula. Patient appears pale, but in no distress. She was able to answer simple questions, voice weak. Per chart note/lab results her hemoglobin this morning was 6.4. Will follow up with patient's attending Dr. Madelon Lips and patient's daughter's [name] and [name], regarding transfusion. Please note per patient's hospice RN her systolic BP usually runs between 160 and 150, she functions well at this number. Hospital care team all aware of hospice involvement. No need for Palliative medicine consult at this time. Will continue to follow and up date hospice team. Thank you. [name] RN, BSN, [facility] [place], hospital Liaison [phone] c

## 2017-05-24 NOTE — Progress Notes (Signed)
[facility]   PATIENT NAME: Melissa Bright    MR#:  [id number]  DATE OF BIRTH:  1923/10/29  SUBJECTIVE:  CHIEF COMPLAINT:   Chief Complaint  Patient presents with  . Respiratory Distress   On hospice at home. For last 2 days worsening SOB, and hospice nurse could not keep her comfortable, so sent to ER> Noted to be in respi failure,started on Bipap- Off now, on nasal canula, alert, and no distress. Hb dropped significantly, as per daughter- she had dark stool a few days ago.  REVIEW OF SYSTEMS:  Due to acquity of illness, and respi failure and some dementia, not able to give ROS.  ROS  DRUG ALLERGIES:   Allergies  Allergen Reactions  . Macrobid [employer] Macro] Other (See Comments)    Reaction: weakness and altered mental status    VITALS:  Blood pressure (!) 147/60, pulse 75, temperature 98.1 F (36.7 C), temperature source Oral, resp. rate 20, height  (1.6 m), weight 66.1 kg (145 lb 12.8 oz), SpO2 100 %.  PHYSICAL EXAMINATION:  GENERAL:  83 y.o.-year-old patient lying in the bed with no acute distress.  EYES: Pupils equal, round, reactive to light and accommodation. No scleral icterus. Extraocular muscles intact.  HEENT: Head atraumatic, normocephalic. Oropharynx and nasopharynx clear.  NECK:  Supple, no jugular venous distention. No thyroid enlargement, no tenderness.  LUNGS: Normal breath sounds bilaterally, no wheezing, bilateral crepitation. No use of accessory muscles of respiration.  CARDIOVASCULAR: S1, S2 normal. No murmurs, rubs, or gallops.  ABDOMEN: Soft, nontender, nondistended. Bowel sounds present. No organomegaly or mass.  EXTREMITIES:some pedal edema,no cyanosis, or clubbing.  NEUROLOGIC: Cranial nerves II through XII are intact. Muscle strength 2-3/5 in all extremities. Sensation intact. Gait not checked.  PSYCHIATRIC: The patient is alert and oriented x 1.  SKIN: No obvious rash, lesion, or ulcer.    Physical Exam LABORATORY PANEL:   CBC  Recent Labs Lab 05/24/17 0814  WBC 7.4  HGB 6.4*  HCT 20.2*  PLT 373   ------------------------------------------------------------------------------------------------------------------  Chemistries   Recent Labs Lab 05/23/17 0408 05/24/17 0413  NA 139 141  K 4.7 4.7  CL 110 110  CO2 17* 24  GLUCOSE 324* 113*  BUN 36* 44*  CREATININE 1.83* 1.86*  CALCIUM 8.9 9.0  AST 28  --   ALT 10*  --   ALKPHOS 90  --   BILITOT 0.3  --    ------------------------------------------------------------------------------------------------------------------  Cardiac Enzymes No results for input(s): TROPONINI in the last 168 hours. ------------------------------------------------------------------------------------------------------------------  RADIOLOGY:  Dg Chest Port 1 View  Result Date: 05/23/2017 CLINICAL DATA:  Acute onset shortness of breath. EXAM: PORTABLE CHEST 1 VIEW COMPARISON:  08/10/2016 FINDINGS: Normal heart size and pulmonary vascularity. Shallow inspiration. Bilateral perihilar and basilar infiltrates, greater on the right. Findings may indicate edema or multifocal pneumonia. Suggestion of small bilateral pleural effusions. No pneumothorax. Azygos lobe. Calcified aorta. Degenerative changes in the shoulders. Vascular stent in the right axilla. IMPRESSION: Perihilar and basilar infiltrates greater on the right may indicate multifocal pneumonia or edema. Small bilateral pleural effusions. Electronically Signed   By: Burman Nieves M.D.   On: 05/23/2017 05:12    ASSESSMENT AND PLAN:   Active Problems:   Acute on chronic respiratory failure with hypoxia (HCC)   * Ac on ch respi failure   Due to Pulmonary edema   Ac on ch systolic CHF   Pneumonia suspected, but ruled out.    IV  lasix. Bipap, now on nasla canula. Stopped ABx.    High risk of worsening in respi status.    She was on hospice.    Hospice nurse spoke to  family, the goals are still to take her home with hospice. I explained , in the event like this, they should have agreed to keep her comfortable at hospice home, if that is the goal.  As the goals are still same, no need for palliative care consult.  * Anemia of acute  blood loss   GI bleed, Due age and multiple medical issues, not a candidate for procedure, daughter understands that- as it was already discussed in past.   She agreed to just get blood transfusion as it will help with pt to get improvement in her respi status.    I explained the possibility of fluid overload, she understands but agreed to get one unit PRBC.    *  Hypertension: uncontrolled; consider spironolactone.   nitro paste on her chest     Improved after addressing respi distress and giving IV lasix.    Today have hypotension,stopped hydralazine. * DVT prophylaxis: heparin stopped due to GI bleed and anemia. * GI prophylaxis: none   All the records are reviewed and case discussed with Care Management/Social Workerr. Management plans discussed with the patient, family and they are in agreement.  CODE STATUS: DNR  TOTAL TIME TAKING CARE OF THIS PATIENT: 35 minutes.   POSSIBLE D/C IN 1-2 DAYS, DEPENDING ON CLINICAL CONDITION. Discussed with her daughter and hospice nurse.  Altamese Dilling M.D on 05/24/2017   Between 7am to 6pm - Pager - [phone]  After 6pm go to [email or web address] - password [email or web address]  [facility]  Office  [phone]  CC: Primary care physician; Health, [facility]  Note: This dictation was prepared with Dragon dictation along with smaller phrase technology. Any transcriptional errors that result from this process are unintentional.

## 2017-05-25 LAB — CBC
HEMATOCRIT: 22.1 % — AB (ref 35.0–47.0)
Hemoglobin: 7.3 g/dL — ABNORMAL LOW (ref 12.0–16.0)
MCH: 27.2 pg (ref 26.0–34.0)
MCHC: 33.1 g/dL (ref 32.0–36.0)
MCV: 82.3 fL (ref 80.0–100.0)
PLATELETS: 320 10*3/uL (ref 150–440)
RBC: 2.69 MIL/uL — ABNORMAL LOW (ref 3.80–5.20)
RDW: 18.1 % — AB (ref 11.5–14.5)
WBC: 8.7 10*3/uL (ref 3.6–11.0)

## 2017-05-25 LAB — BPAM RBC
Blood Product Expiration Date: 201810242359
ISSUE DATE / TIME: 201809241541
Unit Type and Rh: 1700

## 2017-05-25 LAB — TYPE AND SCREEN
ABO/RH(D): B NEG
Antibody Screen: NEGATIVE
Unit division: 0

## 2017-05-25 LAB — BASIC METABOLIC PANEL
ANION GAP: 9 (ref 5–15)
BUN: 44 mg/dL — ABNORMAL HIGH (ref 6–20)
CALCIUM: 9 mg/dL (ref 8.9–10.3)
CO2: 24 mmol/L (ref 22–32)
Chloride: 108 mmol/L (ref 101–111)
Creatinine, Ser: 2.15 mg/dL — ABNORMAL HIGH (ref 0.44–1.00)
GFR calc Af Amer: 22 mL/min — ABNORMAL LOW (ref >60)
GFR, EST NON AFRICAN AMERICAN: 19 mL/min — AB (ref >60)
GLUCOSE: 92 mg/dL (ref 65–99)
Potassium: 4.5 mmol/L (ref 3.5–5.1)
Sodium: 141 mmol/L (ref 135–145)

## 2017-05-25 MED ORDER — FUROSEMIDE 20 MG PO TABS: 20.0000 mg | ORAL_TABLET | Freq: Every day | ORAL | 11 refills | Status: AC

## 2017-05-25 MED ORDER — FERROUS SULFATE 325 (65 FE) MG PO TBEC: 325.0000 mg | DELAYED_RELEASE_TABLET | Freq: Two times a day (BID) | ORAL | 3 refills | Status: AC

## 2017-05-25 MED ORDER — ENSURE ENLIVE PO LIQD: 237.0000 mL | Freq: Three times a day (TID) | ORAL | 12 refills | Status: AC

## 2017-05-25 MED ORDER — CARVEDILOL 6.25 MG PO TABS: 6.2500 mg | ORAL_TABLET | Freq: Two times a day (BID) | ORAL | 0 refills | Status: AC

## 2017-05-25 NOTE — Discharge Instructions (Signed)

## 2017-05-25 NOTE — Care Management Note (Signed)
Case Management Note  Patient Details  Name: Melissa Bright MRN: [id number] Date of Birth: Jan 09, 1924  Patient to discharge home today with resumption of Home Hospice services.  [name] hospice liaison is aware.  EMS from completed and place on chart.  RNCM signing off.  Subjective/Objective:                    Action/Plan:   Expected Discharge Date:  05/25/17               Expected Discharge Plan:  Home w Hospice Care  In-House Referral:     Discharge planning Services     Post Acute Care Choice:  Resumption of Svcs/PTA Provider Choice offered to:     DME Arranged:    DME Agency:     HH Arranged:    HH Agency:     Status of Service:  Completed, signed off  If discussed at [employer], dates discussed:    Additional Comments:  Chapman Fitch, RN 05/25/2017, 11:48 AM

## 2017-05-25 NOTE — Discharge Summary (Signed)
[facility] [place] at [facility]   PATIENT NAME: Melissa Bright    MR#:  [id number]  DATE OF BIRTH:  1923-12-27  DATE OF ADMISSION:  05/23/2017 ADMITTING PHYSICIAN: Arnaldo Natal, MD  DATE OF DISCHARGE: 05/25/2017   PRIMARY CARE PHYSICIAN: [facility]    ADMISSION DIAGNOSIS:  Shortness of breath [R06.02] Acute on chronic respiratory failure with hypoxia (HCC) [J96.21] Sepsis, due to unspecified organism (HCC) [A41.9]  DISCHARGE DIAGNOSIS:  Active Problems:   Acute on chronic respiratory failure with hypoxia (HCC)   Ac on ch systolic CHF exacerbation   SECONDARY DIAGNOSIS:   Past Medical History:  Diagnosis Date  . Arthritis   . Blood transfusion without reported diagnosis   . CHF (congestive heart failure) (HCC)   . Coronary artery disease   . Hypertension   . Renal disorder   . Stroke [facility])     HOSPITAL COURSE:   * Ac on ch respi failure   Due to Pulmonary edema   Ac on ch systolic CHF   Pneumonia suspected, but ruled out.    IV lasix. Bipap, now on nasla canula. Stopped ABx.    High risk of worsening in respi status.    She was on hospice.    Hospice nurse spoke to family, the goals are still to take her home with hospice. I explained , in the event like this, they should have agreed to keep her comfortable at hospice home, if that is the goal.  As the goals are still same, no need for palliative care consult.  * Anemia of acute  blood loss   GI bleed, Due age and multiple medical issues, not a candidate for procedure, daughter understands that- as it was already discussed in past.   She agreed to just get blood transfusion as it will help with pt to get improvement in her respi status.    I explained the possibility of fluid overload, she understands but agreed to get one unit PRBC.   Hb > 7, pt is stable- d/c today.    *  Hypertension: uncontrolled; consider spironolactone.   nitro paste on her chest     Improved  after addressing respi distress and giving IV lasix.    Today have hypotension,stopped hydralazine. * DVT prophylaxis: heparin stopped due to GI bleed and anemia. * GI prophylaxis: none  DISCHARGE CONDITIONS:   Stable.  CONSULTS OBTAINED:    DRUG ALLERGIES:   Allergies  Allergen Reactions  . Macrobid [employer] Macro] Other (See Comments)    Reaction: weakness and altered mental status    DISCHARGE MEDICATIONS:   Current Discharge Medication List    START taking these medications   Details  carvedilol (COREG) 6.25 MG tablet Take 1 tablet (6.25 mg total) by mouth 2 (two) times daily with a meal. Qty: 60 tablet, Refills: 0    feeding supplement, ENSURE ENLIVE, (ENSURE ENLIVE) LIQD Take 237 mLs by mouth 3 (three) times daily between meals. Qty: 237 mL, Refills: 12    ferrous sulfate 325 (65 FE) MG EC tablet Take 1 tablet (325 mg total) by mouth 2 (two) times daily. Qty: 60 tablet, Refills: 3    furosemide (LASIX) 20 MG tablet Take 1 tablet (20 mg total) by mouth daily. Qty: 30 tablet, Refills: 11      CONTINUE these medications which have NOT CHANGED   Details  senna-docusate (SENOKOT-S) 8.6-50 MG tablet Take 1 tablet by mouth 2 (two) times daily.  triamcinolone cream (KENALOG) 0.1 % Apply 1 application topically 2 (two) times daily.         DISCHARGE INSTRUCTIONS:    Follow with hospice care at home.  If you experience worsening of your admission symptoms, develop shortness of breath, life threatening emergency, suicidal or homicidal thoughts you must seek medical attention immediately by calling 911 or calling your MD immediately  if symptoms less severe.  You Must read complete instructions/literature along with all the possible adverse reactions/side effects for all the Medicines you take and that have been prescribed to you. Take any new Medicines after you have completely understood and accept all the possible adverse reactions/side effects.    Please note  You were cared for by a hospitalist during your hospital stay. If you have any questions about your discharge medications or the care you received while you were in the hospital after you are discharged, you can call the unit and asked to speak with the hospitalist on call if the hospitalist that took care of you is not available. Once you are discharged, your primary care physician will handle any further medical issues. Please note that NO REFILLS for any discharge medications will be authorized once you are discharged, as it is imperative that you return to your primary care physician (or establish a relationship with a primary care physician if you do not have one) for your aftercare needs so that they can reassess your need for medications and monitor your lab values.    Today   CHIEF COMPLAINT:   Chief Complaint  Patient presents with  . Respiratory Distress    HISTORY OF PRESENT ILLNESS:  Melissa Bright  is a 83 y.o. female with a known history of CHF, HTN, arthritis and stroke presents to the emergency department due to shortness of breath. The patient's daughters state that she was in her usual state of health until tonight when see called her daughter for help who found her to be dyspneic and listless. In the Emergency Department the patient was placed in BiPAP due to increased work of breathing. CXR showed possible RLL pneumonia and the patient was started on broad spectrum antibiotics before the hospitalist service was called for admission.   VITAL SIGNS:  Blood pressure (!) 156/49, pulse 74, temperature 98 F (36.7 C), temperature source Oral, resp. rate 20, height  (1.6 m), weight 67.2 kg (148 lb 3.2 oz), SpO2 100 %.  I/O:   Intake/Output Summary (Last 24 hours) at 05/25/17 1128 Last data filed at 05/25/17 0247  Gross per 24 hour  Intake              340 ml  Output              800 ml  Net             -460 ml    PHYSICAL EXAMINATION:   GENERAL:  83  y.o.-year-old patient lying in the bed with no acute distress.  EYES: Pupils equal, round, reactive to light and accommodation. No scleral icterus. Extraocular muscles intact.  HEENT: Head atraumatic, normocephalic. Oropharynx and nasopharynx clear.  NECK:  Supple, no jugular venous distention. No thyroid enlargement, no tenderness.  LUNGS: Normal breath sounds bilaterally, no wheezing, bilateral crepitation. No use of accessory muscles of respiration.  CARDIOVASCULAR: S1, S2 normal. No murmurs, rubs, or gallops.  ABDOMEN: Soft, nontender, nondistended. Bowel sounds present. No organomegaly or mass.  EXTREMITIES:some pedal edema,no cyanosis, or clubbing.  NEUROLOGIC: Cranial nerves II  through XII are intact. Muscle strength 2-3/5 in all extremities. Sensation intact. Gait not checked.  PSYCHIATRIC: The patient is alert and oriented x 1.  SKIN: No obvious rash, lesion, or ulcer.    DATA REVIEW:   CBC  Recent Labs Lab 05/25/17 0538  WBC 8.7  HGB 7.3*  HCT 22.1*  PLT 320    Chemistries   Recent Labs Lab 05/23/17 0408  05/25/17 0538  NA 139  < > 141  K 4.7  < > 4.5  CL 110  < > 108  CO2 17*  < > 24  GLUCOSE 324*  < > 92  BUN 36*  < > 44*  CREATININE 1.83*  < > 2.15*  CALCIUM 8.9  < > 9.0  AST 28  --   --   ALT 10*  --   --   ALKPHOS 90  --   --   BILITOT 0.3  --   --   < > = values in this interval not displayed.  Cardiac Enzymes No results for input(s): TROPONINI in the last 168 hours.  Microbiology Results  Results for orders placed or performed during the hospital encounter of 05/23/17  Culture, blood (Routine x 2)     Status: None (Preliminary result)   Collection Time: 05/23/17  4:20 AM  Result Value Ref Range Status   Specimen Description BLOOD RIGHT ANTECUBITAL  Final   Special Requests   Final    BOTTLES DRAWN AEROBIC AND ANAEROBIC Blood Culture adequate volume   Culture NO GROWTH 1 DAY  Final   Report Status PENDING  Incomplete  Culture, blood (Routine  x 2)     Status: None (Preliminary result)   Collection Time: 05/23/17  4:20 AM  Result Value Ref Range Status   Specimen Description BLOOD RIGHT FOREARM  Final   Special Requests   Final    BOTTLES DRAWN AEROBIC AND ANAEROBIC Blood Culture adequate volume   Culture NO GROWTH 1 DAY  Final   Report Status PENDING  Incomplete  MRSA PCR Screening     Status: Abnormal   Collection Time: 05/23/17  6:53 AM  Result Value Ref Range Status   MRSA by PCR POSITIVE (A) NEGATIVE Final    Comment:        The GeneXpert MRSA Assay (FDA approved for NASAL specimens only), is one component of a comprehensive MRSA colonization surveillance program. It is not intended to diagnose MRSA infection nor to guide or monitor treatment for MRSA infections. RESULT CALLED TO, READ BACK BY AND VERIFIED WITH: Dutch Quint @ [id number] 05/23/17 by [facility]     RADIOLOGY:  No results found.  EKG:   Orders placed or performed during the hospital encounter of 05/23/17  . EKG 12-Lead  . EKG 12-Lead  . ED EKG  . ED EKG  . ED EKG 12-Lead  . ED EKG 12-Lead      Management plans discussed with the patient, family and they are in agreement.  CODE STATUS:     Code Status Orders        Start     Ordered   05/23/17 [phone]  Do not attempt resuscitation (DNR)  Continuous    Question Answer Comment  In the event of cardiac or respiratory ARREST Do not call a "code blue"   In the event of cardiac or respiratory ARREST Do not perform Intubation, CPR, defibrillation or ACLS   In the event of cardiac or respiratory ARREST Use medication by any  route, position, wound care, and other measures to relive pain and suffering. May use oxygen, suction and manual treatment of airway obstruction as needed for comfort.      05/23/17 1610    Code Status History    Date Active Date Inactive Code Status Order ID Comments User Context   08/06/2016 10:53 AM 08/12/2016  7:05 PM DNR [id number]  Eugenie Norrie, NP ED   08/06/2016  8:56 AM  08/06/2016 10:53 AM DNR [id number]  Willy Eddy, MD ED   08/01/2016 10:37 PM 08/03/2016  6:57 PM DNR [id number]  Oralia Manis, MD Inpatient   08/01/2016  9:27 PM 08/01/2016 10:37 PM DNR [id number]  Loleta Rose, MD ED   07/12/2016  3:02 PM 07/17/2016 11:16 PM DNR [id number]  Gracelyn Nurse, MD ED   07/12/2016 12:57 PM 07/12/2016  3:02 PM DNR [id number]  Willy Eddy, MD ED   03/24/2016  8:08 PM 03/26/2016  7:35 PM DNR [id number]  Houston Siren, MD Inpatient   11/21/2015 11:30 AM 11/22/2015  6:31 PM DNR [id number]  Enedina Finner, MD Inpatient   11/20/2015  1:12 PM 11/21/2015 11:30 AM Full Code [id number]  Katharina Caper, MD ED   11/09/2015  4:26 PM 11/10/2015  5:19 PM DNR [id number]  Houston Siren, MD Inpatient      TOTAL TIME TAKING CARE OF THIS PATIENT: 35 minutes.    Altamese Dilling M.D on 05/25/2017 at 11:28 AM  Between 7am to 6pm - Pager - [phone]  After 6pm go to [email or web address] - password [email or web address]  [facility] [place] [facility]  Office  [phone]  CC: Primary care physician; [facility]   Note: This dictation was prepared with Dragon dictation along with smaller phrase technology. Any transcriptional errors that result from this process are unintentional.

## 2017-05-25 NOTE — Progress Notes (Signed)
Pt for discharge back home via ems  With hospice care.  02  2l [place]  Foley d/cd.  tol well.  Discharge instructions to dtr judy. Diet / activity / f/u and meds discussed. Ems called to transport home.

## 2017-05-25 NOTE — Progress Notes (Signed)
Visit made. Patient seen sitting up in bed, much more alert and interactive than at visit yesterday. She did receive 1 unit of PRBC's , Hb 7.3 this morning. Daughter Pam at bedside and reports that attending physician Dr. Elisabeth Pigeon has rounded and the plan is for discharge home today. Patient will require EMS transport. CMRN Judeth Cornfield made aware. Hospice team updated to discharge. Thank you. [name] RN, BSN, [facility] [place], hospital Liaison [phone] c

## 2017-05-28 LAB — CULTURE, BLOOD (ROUTINE X 2)
Culture: NO GROWTH
Culture: NO GROWTH
Special Requests: ADEQUATE
Special Requests: ADEQUATE

## 2017-12-06 DIAGNOSIS — J9610 Chronic respiratory failure, unspecified whether with hypoxia or hypercapnia: Secondary | ICD-10-CM | POA: Diagnosis not present

## 2017-12-06 DIAGNOSIS — I5040 Unspecified combined systolic (congestive) and diastolic (congestive) heart failure: Secondary | ICD-10-CM | POA: Diagnosis not present

## 2017-12-06 DIAGNOSIS — R5381 Other malaise: Secondary | ICD-10-CM | POA: Diagnosis not present

## 2017-12-06 DIAGNOSIS — R1310 Dysphagia, unspecified: Secondary | ICD-10-CM | POA: Diagnosis not present

## 2017-12-06 DIAGNOSIS — K5792 Diverticulitis of intestine, part unspecified, without perforation or abscess without bleeding: Secondary | ICD-10-CM | POA: Diagnosis not present

## 2017-12-06 DIAGNOSIS — N189 Chronic kidney disease, unspecified: Secondary | ICD-10-CM | POA: Diagnosis not present

## 2017-12-06 DIAGNOSIS — F0150 Vascular dementia without behavioral disturbance: Secondary | ICD-10-CM | POA: Diagnosis not present

## 2017-12-06 DIAGNOSIS — I10 Essential (primary) hypertension: Secondary | ICD-10-CM | POA: Diagnosis not present

## 2017-12-11 DIAGNOSIS — Z79899 Other long term (current) drug therapy: Secondary | ICD-10-CM | POA: Diagnosis not present

## 2018-01-03 IMAGING — DX DG CHEST 1V PORT
1 series · 1 of 1 positions shown · non-contrast
Comparison: 11/09/2015

CLINICAL DATA: Hypoxia

EXAM:
PORTABLE CHEST 1 VIEW

[chest ap]
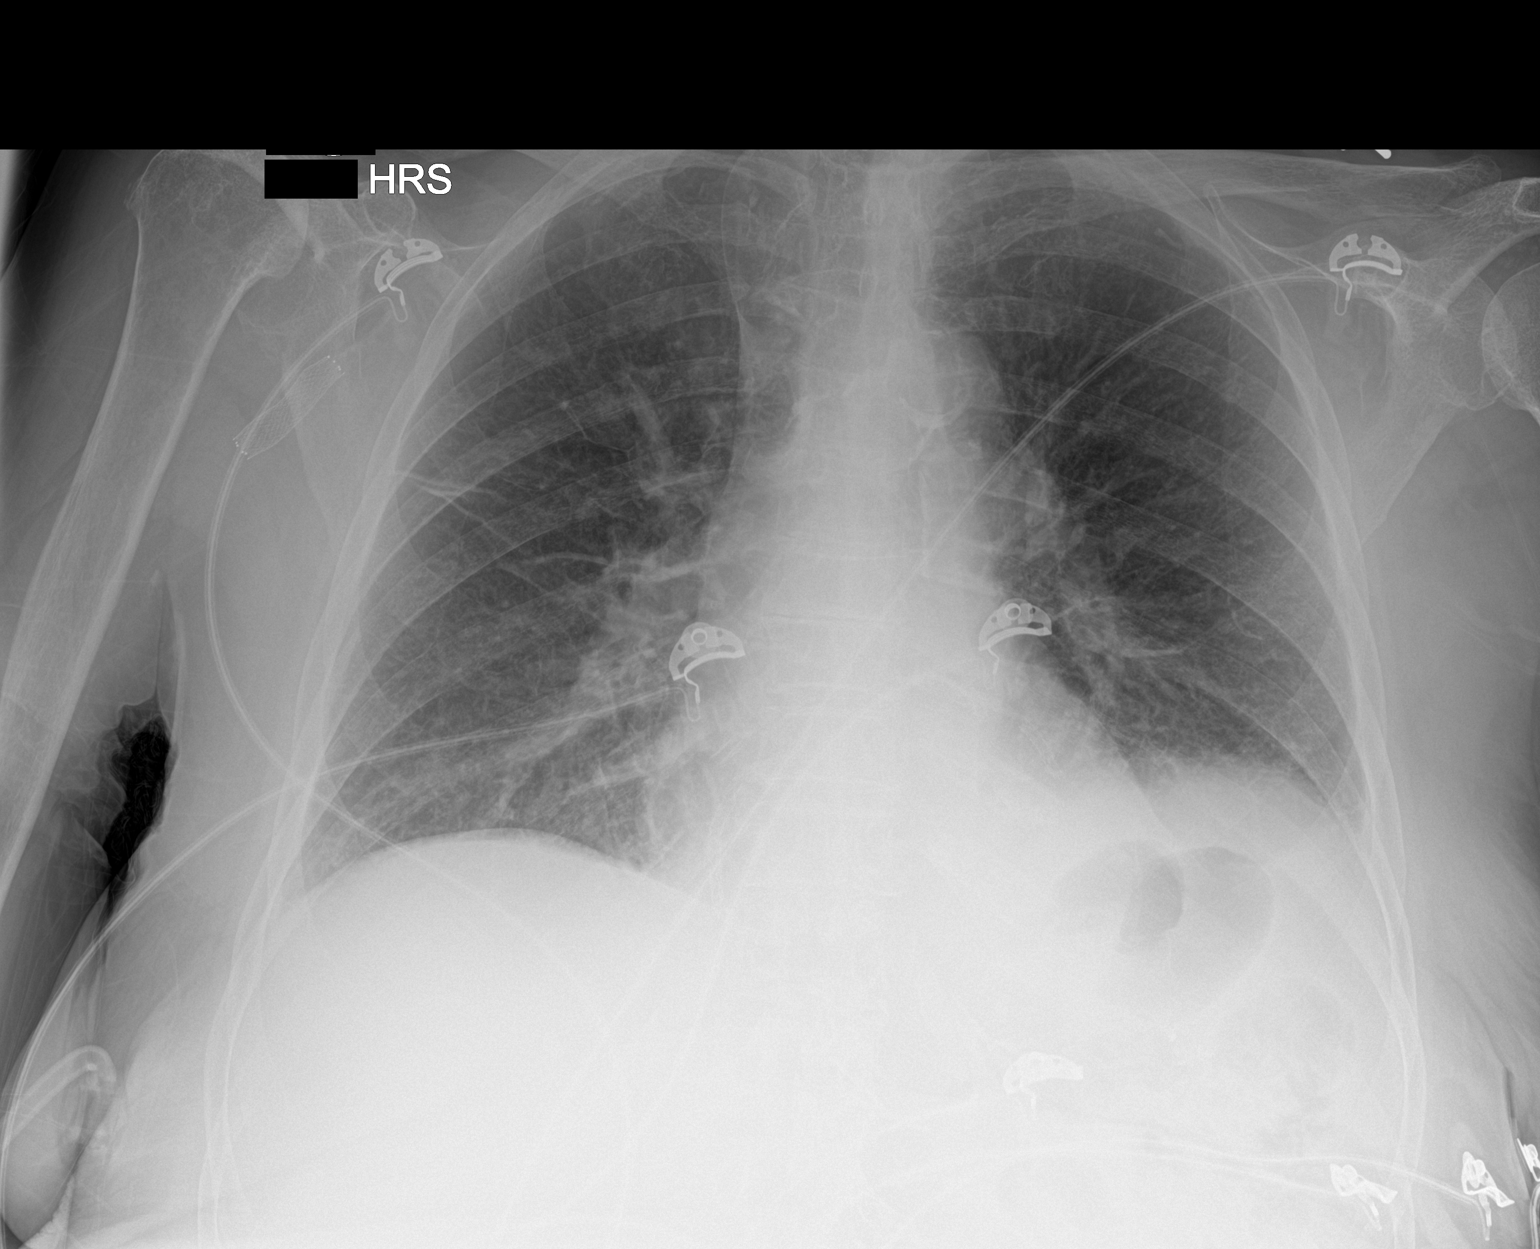

[1 of 1 positions shown; findings below may reference images not displayed]

FINDINGS: Cardiac shadow is within normal limits. The lungs are well aerated
bilaterally. Mild atelectatic changes are seen in the left lung
base. No focal confluent infiltrate is seen. No bony abnormality is
noted.
IMPRESSION: Mild left basilar atelectasis.

## 2018-01-06 DIAGNOSIS — I129 Hypertensive chronic kidney disease with stage 1 through stage 4 chronic kidney disease, or unspecified chronic kidney disease: Secondary | ICD-10-CM | POA: Diagnosis not present

## 2018-01-06 DIAGNOSIS — Z515 Encounter for palliative care: Secondary | ICD-10-CM | POA: Diagnosis not present

## 2018-01-06 DIAGNOSIS — R531 Weakness: Secondary | ICD-10-CM | POA: Diagnosis not present

## 2018-01-06 DIAGNOSIS — R609 Edema, unspecified: Secondary | ICD-10-CM | POA: Diagnosis not present

## 2018-01-10 DIAGNOSIS — J168 Pneumonia due to other specified infectious organisms: Secondary | ICD-10-CM | POA: Diagnosis not present

## 2018-01-10 DIAGNOSIS — R0602 Shortness of breath: Secondary | ICD-10-CM | POA: Diagnosis not present

## 2018-02-10 DIAGNOSIS — R0602 Shortness of breath: Secondary | ICD-10-CM | POA: Diagnosis not present

## 2018-02-10 DIAGNOSIS — J168 Pneumonia due to other specified infectious organisms: Secondary | ICD-10-CM | POA: Diagnosis not present

## 2018-03-12 DIAGNOSIS — J168 Pneumonia due to other specified infectious organisms: Secondary | ICD-10-CM | POA: Diagnosis not present

## 2018-03-12 DIAGNOSIS — R0602 Shortness of breath: Secondary | ICD-10-CM | POA: Diagnosis not present

## 2018-03-25 DIAGNOSIS — I10 Essential (primary) hypertension: Secondary | ICD-10-CM | POA: Diagnosis not present

## 2018-03-25 DIAGNOSIS — J441 Chronic obstructive pulmonary disease with (acute) exacerbation: Secondary | ICD-10-CM | POA: Diagnosis not present

## 2018-03-25 DIAGNOSIS — E039 Hypothyroidism, unspecified: Secondary | ICD-10-CM | POA: Diagnosis not present

## 2018-03-25 DIAGNOSIS — K5900 Constipation, unspecified: Secondary | ICD-10-CM | POA: Diagnosis not present

## 2018-04-12 DIAGNOSIS — J168 Pneumonia due to other specified infectious organisms: Secondary | ICD-10-CM | POA: Diagnosis not present

## 2018-04-12 DIAGNOSIS — R0602 Shortness of breath: Secondary | ICD-10-CM | POA: Diagnosis not present

## 2018-04-19 DIAGNOSIS — R531 Weakness: Secondary | ICD-10-CM | POA: Diagnosis not present

## 2018-04-19 DIAGNOSIS — Z515 Encounter for palliative care: Secondary | ICD-10-CM | POA: Diagnosis not present

## 2018-04-19 DIAGNOSIS — I129 Hypertensive chronic kidney disease with stage 1 through stage 4 chronic kidney disease, or unspecified chronic kidney disease: Secondary | ICD-10-CM | POA: Diagnosis not present

## 2018-04-19 DIAGNOSIS — R609 Edema, unspecified: Secondary | ICD-10-CM | POA: Diagnosis not present

## 2018-05-13 DIAGNOSIS — R0602 Shortness of breath: Secondary | ICD-10-CM | POA: Diagnosis not present

## 2018-05-13 DIAGNOSIS — J168 Pneumonia due to other specified infectious organisms: Secondary | ICD-10-CM | POA: Diagnosis not present

## 2018-05-19 ENCOUNTER — Emergency Department: Payer: Medicare HMO

## 2018-05-19 ENCOUNTER — Other Ambulatory Visit: Payer: Self-pay

## 2018-05-19 ENCOUNTER — Inpatient Hospital Stay
Admission: EM | Admit: 2018-05-19 | Discharge: 2018-05-21 | DRG: 291 | Disposition: A | Discharge status: Hospice | Payer: Medicare HMO | Attending: Internal Medicine | Admitting: Internal Medicine

## 2018-05-19 DIAGNOSIS — R4182 Altered mental status, unspecified: Secondary | ICD-10-CM | POA: Diagnosis present

## 2018-05-19 DIAGNOSIS — E039 Hypothyroidism, unspecified: Secondary | ICD-10-CM | POA: Diagnosis present

## 2018-05-19 DIAGNOSIS — N184 Chronic kidney disease, stage 4 (severe): Secondary | ICD-10-CM | POA: Diagnosis present

## 2018-05-19 DIAGNOSIS — Z79899 Other long term (current) drug therapy: Secondary | ICD-10-CM

## 2018-05-19 DIAGNOSIS — I252 Old myocardial infarction: Secondary | ICD-10-CM

## 2018-05-19 DIAGNOSIS — E669 Obesity, unspecified: Secondary | ICD-10-CM | POA: Diagnosis present

## 2018-05-19 DIAGNOSIS — J9601 Acute respiratory failure with hypoxia: Secondary | ICD-10-CM | POA: Diagnosis not present

## 2018-05-19 DIAGNOSIS — E875 Hyperkalemia: Secondary | ICD-10-CM | POA: Diagnosis present

## 2018-05-19 DIAGNOSIS — Z79890 Hormone replacement therapy (postmenopausal): Secondary | ICD-10-CM

## 2018-05-19 DIAGNOSIS — H9190 Unspecified hearing loss, unspecified ear: Secondary | ICD-10-CM | POA: Diagnosis present

## 2018-05-19 DIAGNOSIS — R0902 Hypoxemia: Secondary | ICD-10-CM

## 2018-05-19 DIAGNOSIS — E43 Unspecified severe protein-calorie malnutrition: Secondary | ICD-10-CM | POA: Diagnosis not present

## 2018-05-19 DIAGNOSIS — I110 Hypertensive heart disease with heart failure: Secondary | ICD-10-CM | POA: Diagnosis not present

## 2018-05-19 DIAGNOSIS — R001 Bradycardia, unspecified: Secondary | ICD-10-CM | POA: Diagnosis not present

## 2018-05-19 DIAGNOSIS — Z8249 Family history of ischemic heart disease and other diseases of the circulatory system: Secondary | ICD-10-CM

## 2018-05-19 DIAGNOSIS — J986 Disorders of diaphragm: Secondary | ICD-10-CM | POA: Diagnosis not present

## 2018-05-19 DIAGNOSIS — R0602 Shortness of breath: Secondary | ICD-10-CM | POA: Diagnosis not present

## 2018-05-19 DIAGNOSIS — F0390 Unspecified dementia without behavioral disturbance: Secondary | ICD-10-CM | POA: Diagnosis present

## 2018-05-19 DIAGNOSIS — Z90710 Acquired absence of both cervix and uterus: Secondary | ICD-10-CM | POA: Diagnosis not present

## 2018-05-19 DIAGNOSIS — R269 Unspecified abnormalities of gait and mobility: Secondary | ICD-10-CM | POA: Diagnosis not present

## 2018-05-19 DIAGNOSIS — Z66 Do not resuscitate: Secondary | ICD-10-CM | POA: Diagnosis not present

## 2018-05-19 DIAGNOSIS — R140 Abdominal distension (gaseous): Secondary | ICD-10-CM | POA: Diagnosis not present

## 2018-05-19 DIAGNOSIS — I509 Heart failure, unspecified: Secondary | ICD-10-CM

## 2018-05-19 DIAGNOSIS — I2510 Atherosclerotic heart disease of native coronary artery without angina pectoris: Secondary | ICD-10-CM | POA: Diagnosis present

## 2018-05-19 DIAGNOSIS — Z23 Encounter for immunization: Secondary | ICD-10-CM | POA: Diagnosis not present

## 2018-05-19 DIAGNOSIS — Z6829 Body mass index (BMI) 29.0-29.9, adult: Secondary | ICD-10-CM

## 2018-05-19 DIAGNOSIS — N179 Acute kidney failure, unspecified: Secondary | ICD-10-CM

## 2018-05-19 DIAGNOSIS — Z7401 Bed confinement status: Secondary | ICD-10-CM

## 2018-05-19 DIAGNOSIS — Z8673 Personal history of transient ischemic attack (TIA), and cerebral infarction without residual deficits: Secondary | ICD-10-CM | POA: Diagnosis not present

## 2018-05-19 DIAGNOSIS — I130 Hypertensive heart and chronic kidney disease with heart failure and stage 1 through stage 4 chronic kidney disease, or unspecified chronic kidney disease: Secondary | ICD-10-CM | POA: Diagnosis not present

## 2018-05-19 DIAGNOSIS — R739 Hyperglycemia, unspecified: Secondary | ICD-10-CM | POA: Diagnosis present

## 2018-05-19 DIAGNOSIS — I5043 Acute on chronic combined systolic (congestive) and diastolic (congestive) heart failure: Secondary | ICD-10-CM | POA: Diagnosis not present

## 2018-05-19 DIAGNOSIS — I5021 Acute systolic (congestive) heart failure: Secondary | ICD-10-CM | POA: Diagnosis not present

## 2018-05-19 DIAGNOSIS — K3189 Other diseases of stomach and duodenum: Secondary | ICD-10-CM | POA: Diagnosis not present

## 2018-05-19 DIAGNOSIS — J441 Chronic obstructive pulmonary disease with (acute) exacerbation: Secondary | ICD-10-CM

## 2018-05-19 DIAGNOSIS — D638 Anemia in other chronic diseases classified elsewhere: Secondary | ICD-10-CM | POA: Diagnosis present

## 2018-05-19 DIAGNOSIS — Z87891 Personal history of nicotine dependence: Secondary | ICD-10-CM

## 2018-05-19 DIAGNOSIS — Z883 Allergy status to other anti-infective agents status: Secondary | ICD-10-CM

## 2018-05-19 LAB — CBC WITH DIFFERENTIAL/PLATELET
BASOS ABS: 0 10*3/uL (ref 0–0.1)
BASOS PCT: 1 %
EOS ABS: 0.5 10*3/uL (ref 0–0.7)
Eosinophils Relative: 8 %
HCT: 27.1 % — ABNORMAL LOW (ref 35.0–47.0)
HEMOGLOBIN: 8.8 g/dL — AB (ref 12.0–16.0)
Lymphocytes Relative: 12 %
Lymphs Abs: 0.8 10*3/uL — ABNORMAL LOW (ref 1.0–3.6)
MCH: 31.3 pg (ref 26.0–34.0)
MCHC: 32.4 g/dL (ref 32.0–36.0)
MCV: 96.6 fL (ref 80.0–100.0)
MONOS PCT: 7 %
Monocytes Absolute: 0.5 10*3/uL (ref 0.2–0.9)
NEUTROS PCT: 74 %
Neutro Abs: 5.2 10*3/uL (ref 1.4–6.5)
Platelets: 357 10*3/uL (ref 150–440)
RBC: 2.8 MIL/uL — ABNORMAL LOW (ref 3.80–5.20)
RDW: 17.4 % — ABNORMAL HIGH (ref 11.5–14.5)
WBC: 7.1 10*3/uL (ref 3.6–11.0)

## 2018-05-19 LAB — BASIC METABOLIC PANEL
Anion gap: 5 (ref 5–15)
BUN: 43 mg/dL — AB (ref 8–23)
CO2: 23 mmol/L (ref 22–32)
Calcium: 8.6 mg/dL — ABNORMAL LOW (ref 8.9–10.3)
Chloride: 112 mmol/L — ABNORMAL HIGH (ref 98–111)
Creatinine, Ser: 2.49 mg/dL — ABNORMAL HIGH (ref 0.44–1.00)
GFR calc Af Amer: 18 mL/min — ABNORMAL LOW (ref >60)
GFR, EST NON AFRICAN AMERICAN: 16 mL/min — AB (ref >60)
Glucose, Bld: 164 mg/dL — ABNORMAL HIGH (ref 70–99)
POTASSIUM: 5.3 mmol/L — AB (ref 3.5–5.1)
Sodium: 140 mmol/L (ref 135–145)

## 2018-05-19 LAB — BLOOD GAS, VENOUS
Acid-base deficit: 4.7 mmol/L — ABNORMAL HIGH (ref 0.0–2.0)
BICARBONATE: 22 mmol/L (ref 20.0–28.0)
Delivery systems: POSITIVE
FIO2: 0.35
LHR: 8 {breaths}/min
O2 Saturation: 61.3 %
PCO2 VEN: 48 mmHg (ref 44.0–60.0)
PH VEN: 7.27 (ref 7.250–7.430)
Patient temperature: 37
pO2, Ven: 37 mmHg (ref 32.0–45.0)

## 2018-05-19 LAB — PROTIME-INR
INR: 1.06
PROTHROMBIN TIME: 13.7 s (ref 11.4–15.2)

## 2018-05-19 LAB — BRAIN NATRIURETIC PEPTIDE: B Natriuretic Peptide: 687 pg/mL — ABNORMAL HIGH (ref 0.0–100.0)

## 2018-05-19 LAB — TROPONIN I: Troponin I: <0.03 ng/mL (ref <0.03)

## 2018-05-19 LAB — TSH: TSH: 56.631 u[IU]/mL — AB (ref 0.350–4.500)

## 2018-05-19 LAB — T4, FREE: FREE T4: 0.65 ng/dL — AB (ref 0.82–1.77)

## 2018-05-19 LAB — MRSA PCR SCREENING: MRSA by PCR: POSITIVE — AB

## 2018-05-19 LAB — PROCALCITONIN

## 2018-05-19 MED ORDER — ACETAMINOPHEN 325 MG PO TABS: 650.0000 mg | ORAL_TABLET | Freq: Four times a day (QID) | ORAL | Status: DC | PRN

## 2018-05-19 MED ORDER — METHYLPREDNISOLONE SODIUM SUCC 125 MG IJ SOLR
60.0000 mg | Freq: Two times a day (BID) | INTRAMUSCULAR | Status: DC
  Administered 2018-05-20: 60 mg via INTRAVENOUS
  Filled 2018-05-19: qty 2

## 2018-05-19 MED ORDER — POLYETHYLENE GLYCOL 3350 17 G PO PACK: 17.0000 g | PACK | Freq: Every day | ORAL | Status: DC | PRN

## 2018-05-19 MED ORDER — SENNOSIDES-DOCUSATE SODIUM 8.6-50 MG PO TABS
1.0000 | ORAL_TABLET | Freq: Two times a day (BID) | ORAL | Status: DC
  Administered 2018-05-19 – 2018-05-21 (×4): 1 via ORAL
  Filled 2018-05-19 (×4): qty 1

## 2018-05-19 MED ORDER — ENSURE ENLIVE PO LIQD
237.0000 mL | Freq: Three times a day (TID) | ORAL | Status: DC
  Administered 2018-05-20 – 2018-05-21 (×3): 237 mL via ORAL

## 2018-05-19 MED ORDER — LORAZEPAM 0.5 MG PO TABS
0.5000 mg | ORAL_TABLET | Freq: Two times a day (BID) | ORAL | Status: DC
  Administered 2018-05-19 – 2018-05-21 (×4): 0.5 mg via ORAL
  Filled 2018-05-19 (×4): qty 1

## 2018-05-19 MED ORDER — HYDRALAZINE HCL 20 MG/ML IJ SOLN
10.0000 mg | INTRAMUSCULAR | Status: DC | PRN
  Administered 2018-05-19: 10 mg via INTRAVENOUS
  Filled 2018-05-19: qty 1

## 2018-05-19 MED ORDER — TIOTROPIUM BROMIDE MONOHYDRATE 18 MCG IN CAPS
18.0000 ug | ORAL_CAPSULE | Freq: Every day | RESPIRATORY_TRACT | Status: DC
  Administered 2018-05-20: 18 ug via RESPIRATORY_TRACT
  Filled 2018-05-19: qty 5

## 2018-05-19 MED ORDER — ENOXAPARIN SODIUM 30 MG/0.3ML ~~LOC~~ SOLN: 30.0000 mg | SUBCUTANEOUS | Status: DC

## 2018-05-19 MED ORDER — IPRATROPIUM-ALBUTEROL 0.5-2.5 (3) MG/3ML IN SOLN
3.0000 mL | Freq: Once | RESPIRATORY_TRACT | Status: AC
  Administered 2018-05-19: 3 mL via RESPIRATORY_TRACT
  Filled 2018-05-19: qty 3

## 2018-05-19 MED ORDER — FERROUS SULFATE 325 (65 FE) MG PO TABS
325.0000 mg | ORAL_TABLET | Freq: Two times a day (BID) | ORAL | Status: DC
  Administered 2018-05-20 – 2018-05-21 (×3): 325 mg via ORAL
  Filled 2018-05-19 (×3): qty 1

## 2018-05-19 MED ORDER — LATANOPROST 0.005 % OP SOLN
1.0000 [drp] | Freq: Every day | OPHTHALMIC | Status: DC
  Administered 2018-05-20: 1 [drp] via OPHTHALMIC
  Filled 2018-05-19: qty 2.5

## 2018-05-19 MED ORDER — LEVOFLOXACIN 500 MG PO TABS
250.0000 mg | ORAL_TABLET | ORAL | Status: DC
  Administered 2018-05-20: 250 mg via ORAL
  Filled 2018-05-19: qty 1

## 2018-05-19 MED ORDER — HYDRALAZINE HCL 25 MG PO TABS
25.0000 mg | ORAL_TABLET | Freq: Two times a day (BID) | ORAL | Status: DC
  Administered 2018-05-19 – 2018-05-21 (×4): 25 mg via ORAL
  Filled 2018-05-19 (×4): qty 1

## 2018-05-19 MED ORDER — HEPARIN SODIUM (PORCINE) 5000 UNIT/ML IJ SOLN
5000.0000 [IU] | Freq: Three times a day (TID) | INTRAMUSCULAR | Status: DC
  Administered 2018-05-19 – 2018-05-21 (×5): 5000 [IU] via SUBCUTANEOUS
  Filled 2018-05-19 (×5): qty 1

## 2018-05-19 MED ORDER — SODIUM CHLORIDE 0.9% FLUSH
3.0000 mL | Freq: Two times a day (BID) | INTRAVENOUS | Status: DC
  Administered 2018-05-19 – 2018-05-21 (×4): 3 mL via INTRAVENOUS

## 2018-05-19 MED ORDER — IPRATROPIUM-ALBUTEROL 0.5-2.5 (3) MG/3ML IN SOLN
3.0000 mL | Freq: Four times a day (QID) | RESPIRATORY_TRACT | Status: DC
  Administered 2018-05-19 – 2018-05-21 (×7): 3 mL via RESPIRATORY_TRACT
  Filled 2018-05-19 (×8): qty 3

## 2018-05-19 MED ORDER — FUROSEMIDE 10 MG/ML IJ SOLN
40.0000 mg | Freq: Every day | INTRAMUSCULAR | Status: DC
  Administered 2018-05-20: 40 mg via INTRAVENOUS
  Filled 2018-05-19: qty 4

## 2018-05-19 MED ORDER — INFLUENZA VAC SPLIT HIGH-DOSE 0.5 ML IM SUSY
0.5000 mL | PREFILLED_SYRINGE | INTRAMUSCULAR | Status: AC
  Administered 2018-05-21: 0.5 mL via INTRAMUSCULAR
  Filled 2018-05-19: qty 0.5

## 2018-05-19 MED ORDER — METHYLPREDNISOLONE SODIUM SUCC 125 MG IJ SOLR
125.0000 mg | Freq: Once | INTRAMUSCULAR | Status: AC
  Administered 2018-05-19: 125 mg via INTRAVENOUS
  Filled 2018-05-19: qty 2

## 2018-05-19 MED ORDER — ACETAMINOPHEN 650 MG RE SUPP: 650.0000 mg | Freq: Four times a day (QID) | RECTAL | Status: DC | PRN

## 2018-05-19 MED ORDER — ISOSORBIDE MONONITRATE ER 30 MG PO TB24
30.0000 mg | ORAL_TABLET | Freq: Every day | ORAL | Status: DC
  Administered 2018-05-20 – 2018-05-21 (×2): 30 mg via ORAL
  Filled 2018-05-19 (×2): qty 1

## 2018-05-19 MED ORDER — LEVOTHYROXINE SODIUM 25 MCG PO TABS
25.0000 ug | ORAL_TABLET | Freq: Every day | ORAL | Status: DC
  Administered 2018-05-20: 25 ug via ORAL
  Filled 2018-05-19: qty 1

## 2018-05-19 MED ORDER — FUROSEMIDE 10 MG/ML IJ SOLN
40.0000 mg | Freq: Once | INTRAMUSCULAR | Status: AC
  Administered 2018-05-19: 40 mg via INTRAVENOUS
  Filled 2018-05-19: qty 4

## 2018-05-19 MED ORDER — CARVEDILOL 6.25 MG PO TABS
6.2500 mg | ORAL_TABLET | Freq: Two times a day (BID) | ORAL | Status: DC
  Administered 2018-05-20 – 2018-05-21 (×3): 6.25 mg via ORAL
  Filled 2018-05-19 (×3): qty 1

## 2018-05-19 MED ORDER — METHYLPREDNISOLONE SODIUM SUCC 125 MG IJ SOLR
60.0000 mg | Freq: Two times a day (BID) | INTRAMUSCULAR | Status: DC
  Administered 2018-05-19: 60 mg via INTRAVENOUS
  Filled 2018-05-19: qty 2

## 2018-05-19 MED ORDER — ALBUTEROL SULFATE (2.5 MG/3ML) 0.083% IN NEBU: 2.5000 mg | INHALATION_SOLUTION | RESPIRATORY_TRACT | Status: DC | PRN

## 2018-05-19 NOTE — ED Notes (Signed)
Report called to chancey rn floor nurse

## 2018-05-19 NOTE — ED Notes (Signed)
Informed RN that patient has been roomed and is ready for evaluation.  Patient in NAD at this time and call bell placed within reach.   

## 2018-05-19 NOTE — ED Notes (Signed)
Pt transported to 244

## 2018-05-19 NOTE — Progress Notes (Signed)
Family Meeting Note  Advance Directive:yes  Today a meeting took place with the Patient and daughter.  Patient is able to participate.  The following clinical team members were present during this meeting:MD  The following were discussed:Patient's diagnosis: , Patient's progosis: Unable to determine and Goals for treatment: DNR  Additional follow-up to be provided: prn  Time spent during discussion:20 minutes  Melissa Bright D Kishon Garriga, MD  

## 2018-05-19 NOTE — ED Provider Notes (Signed)
[facility] Emergency Department Provider Note ____________________________________________   I have reviewed the triage vital signs and the triage nursing note.  HISTORY  Chief Complaint Shortness of Breath   Historian Level 5 Caveat History Limited by severe respiratory illness  HPI Melissa Bright is a 84 y.o. female with a history of CHF, coronary artery disease, as well as "uses a nebulizer "per family members, presents by EMS for trouble breathing and altered mental status.  Apparently she was hypoxic although I was not given the number.  Apparently her breathing got worse and sounded congested.  No report of fever coughing.  EMS reports elevated blood pressures, and mental status and blood pressure and breathing status improved after placing on BiPAP.   From severe.    Past Medical History:  Diagnosis Date  . Arthritis   . Blood transfusion without reported diagnosis   . CHF (congestive heart failure) (HCC)   . Coronary artery disease   . Hypertension   . Renal disorder   . Stroke [facility])     Patient Active Problem List   Diagnosis Date Noted  . Acute on chronic respiratory failure with hypoxia (HCC) 05/23/2017  . Protein-calorie malnutrition, severe 08/11/2016  . Acute respiratory failure (HCC) 08/06/2016  . NSTEMI (non-ST elevated myocardial infarction) (HCC) 08/01/2016  . GI bleed 07/12/2016  . Acute posthemorrhagic anemia 03/26/2016  . GIB (gastrointestinal bleeding) 03/26/2016  . Acute on chronic renal failure (HCC) 03/26/2016  . Pyuria 03/26/2016  . Essential hypertension, malignant 03/26/2016  . Dementia with behavioral disturbance 03/26/2016  . Symptomatic anemia 03/24/2016  . Chronic diastolic congestive heart failure (HCC)   . Respiratory failure with hypoxia (HCC) 11/20/2015  . COPD (chronic obstructive pulmonary disease) (HCC) 11/20/2015  . Left lower lobe pneumonia (HCC) 11/20/2015  . Leukocytosis 11/20/2015  . Malignant  essential hypertension 11/20/2015  . CKD (chronic kidney disease), stage IV (HCC) 11/20/2015  . Anemia of chronic disease 11/20/2015  . Acute respiratory failure with hypoxia (HCC) 11/20/2015  . Accelerated hypertension 11/09/2015    Past Surgical History:  Procedure Laterality Date  . ABDOMINAL HYSTERECTOMY    . CARDIAC SURGERY    . EYE SURGERY    . JOINT REPLACEMENT      Prior to Admission medications   Medication Sig Start Date End Date Taking? Authorizing Provider  carvedilol (COREG) 6.25 MG tablet Take 1 tablet (6.25 mg total) by mouth 2 (two) times daily with a meal. 05/25/17  Yes Altamese Dilling, MD  ferrous sulfate 325 (65 FE) MG EC tablet Take 1 tablet (325 mg total) by mouth 2 (two) times daily. 05/25/17 05/25/18 Yes Altamese Dilling, MD  furosemide (LASIX) 20 MG tablet Take 1 tablet (20 mg total) by mouth daily. 05/25/17 05/25/18 Yes Altamese Dilling, MD  hydrALAZINE (APRESOLINE) 50 MG tablet Take 25 mg by mouth 2 (two) times daily. 04/19/18  Yes [provider]  isosorbide mononitrate (IMDUR) 30 MG 24 hr tablet Take 1 tablet by mouth daily. 05/17/18  Yes [provider]  latanoprost (XALATAN) 0.005 % ophthalmic solution Place 1 drop into both eyes at bedtime. 05/11/18  Yes [provider]  levothyroxine (SYNTHROID, LEVOTHROID) 25 MCG tablet Take 1 tablet by mouth daily. 03/15/18  Yes [provider]  LORazepam (ATIVAN) 0.5 MG tablet Take 1 tablet by mouth 2 (two) times daily. 04/19/18  Yes [provider]  senna-docusate (SENOKOT-S) 8.6-50 MG tablet Take 1 tablet by mouth 2 (two) times daily.    Yes [provider]  triamcinolone cream (KENALOG) 0.1 % Apply 1 application topically 2 (two) times daily.   Yes [provider]  feeding supplement, ENSURE ENLIVE, (ENSURE ENLIVE) LIQD Take 237 mLs by mouth 3 (three) times daily between meals. 05/25/17   [name], Vaibhavkumar, MD    Allergies  Allergen  Reactions  . Macrobid [employer][Nitrofurantoin Monohyd Macro] Other (See Comments)    Reaction: weakness and altered mental status    Family History  Problem Relation Age of Onset  . Heart attack Father     Social History Social History   Tobacco Use  . Smoking status: Former Games developermoker  . Smokeless tobacco: Never Used  Substance Use Topics  . Alcohol use: No  . Drug use: No    Review of Systems   She denies headache.  Shakes head no for chest pain.  ____________________________________________   PHYSICAL EXAM:  VITAL SIGNS: ED Triage Vitals  Enc Vitals Group     BP 05/19/18 1132 (!) 163/65     Pulse Rate 05/19/18 1130 70     Resp 05/19/18 1130 16     Temp 05/19/18 1132 (!) 97 F (36.1 C)     Temp Source 05/19/18 1132 Axillary     SpO2 05/19/18 1130 95 %     Weight 05/19/18 1135 150 lb (68 kg)     Height 05/19/18 1135 5\' 3"  (1.6 m)     Head Circumference --      Peak Flow --      Pain Score --      Pain Loc --      Pain Edu? --      Excl. in GC? --      Constitutional: Alert and cooperative, seems to be a little bit slow to answer questions and she is wearing a BiPAP so she is really just shaking her head yes or no.  HEENT      Head: Normocephalic and atraumatic.      Eyes: Conjunctivae are normal. Pupils equal and round.  Left eye somewhat deviated to the left.      Ears:         Nose: No congestion/rhinnorhea.      Mouth/Throat: Mucous membranes are moist.      Neck: No stridor. Cardiovascular/Chest: Normal rate, regular rhythm.  No murmurs, rubs, or gallops. Respiratory: Normal respiratory effort without tachypnea nor retractions.  Rales throughout.  Some expiratory wheezing and moderate rhonchi in all fields. Gastrointestinal: Soft. No distention, no guarding, no rebound. Nontender.  Obese Genitourinary/rectal:Deferred Musculoskeletal: Nontender with normal range of motion in all extremities. No joint effusions.  No lower extremity tenderness.  3+ pitting edema  bilateral lower extremities Neurologic: No facial droop.  No gross or focal neurologic deficits are appreciated. Skin:  Skin is warm, dry and intact. No rash noted. Psychiatric: No agitation.   ____________________________________________  LABS (pertinent positives/negatives) I, Governor Rooksebecca Kimela Malstrom, MD the attending physician have reviewed the labs noted below.  Labs Reviewed  BASIC METABOLIC PANEL - Abnormal; Notable for the following components:      Result Value   Potassium 5.3 (*)    Chloride 112 (*)    Glucose, Bld 164 (*)    BUN 43 (*)    Creatinine, Ser 2.49 (*)    Calcium 8.6 (*)    GFR calc non Af Amer 16 (*)    GFR calc Af Amer 18 (*)    All other components within normal limits  CBC WITH DIFFERENTIAL/PLATELET - Abnormal; Notable for  the following components:   RBC 2.80 (*)    Hemoglobin 8.8 (*)    HCT 27.1 (*)    RDW 17.4 (*)    Lymphs Abs 0.8 (*)    All other components within normal limits  BRAIN NATRIURETIC PEPTIDE - Abnormal; Notable for the following components:   B Natriuretic Peptide 687.0 (*)    All other components within normal limits  BLOOD GAS, VENOUS - Abnormal; Notable for the following components:   Acid-base deficit 4.7 (*)    All other components within normal limits  PROTIME-INR  TROPONIN I    ____________________________________________    EKG I, Governor Rooks, MD, the attending physician have personally viewed and interpreted all ECGs.  66 bpm.  Narrow QRS.  Normal axis.  LVH.  Nonspecific T wave ____________________________________________  RADIOLOGY   Chest x-ray reviewed by me and interpreted by the radiologist:  IMPRESSION: Number interim partial clearing of bibasilar infiltrates. Continued follow-up exams to demonstrate complete clearing suggested.  2. Gastric distention with elevation of the left hemidiaphragm. __________________________________________  PROCEDURES  Procedure(s) performed: None  Procedures  Critical Care  performed: CRITICAL CARE Performed by: Governor Rooks   Total critical care time: 30 minutes  Critical care time was exclusive of separately billable procedures and treating other patients.  Critical care was necessary to treat or prevent imminent or life-threatening deterioration.  Critical care was time spent personally by me on the following activities: development of treatment plan with patient and/or surrogate as well as nursing, discussions with consultants, evaluation of patient's response to treatment, examination of patient, obtaining history from patient or surrogate, ordering and performing treatments and interventions, ordering and review of laboratory studies, ordering and review of radiographic studies, pulse oximetry and re-evaluation of patient's condition.    ____________________________________________  ED COURSE / ASSESSMENT AND PLAN  Pertinent labs & imaging results that were available during my care of the patient were reviewed by me and considered in my medical decision making (see chart for details).     Patient arrived on BiPAP and clinically it sounds like probably volume overload/flash pulmonary edema.  Patient was looking somewhat better on BiPAP on arrival and took her off for just probably less than 3 minutes and patient became hypoxic to 86% and started having increased tachypnea and trouble breathing, and was placed on BiPAP here in the emergency department.  Once daughters got here, they also stated that she has a history of COPD and takes nebulizer treatments.  At this point I did go ahead and add neb laser treatment for wheezing as well as Solu-Medrol.  I suspect combination of CHF exacerbation and COPD.  Found to have acute on chronic renal failure.  Able to be transitioned to 2L [place].    CONSULTATIONS: Hospitalist for admission.  Patient / Family / Caregiver informed of clinical course, medical decision-making process, and agree with  plan.     ___________________________________________   FINAL CLINICAL IMPRESSION(S) / ED DIAGNOSES   Final diagnoses:  Congestive heart failure, unspecified HF chronicity, unspecified heart failure type (HCC)  COPD exacerbation (HCC)  Hypoxia  Acute renal failure, unspecified acute renal failure type (HCC)      ___________________________________________         Note: This dictation was prepared with Dragon dictation. Any transcriptional errors that result from this process are unintentional    Governor Rooks, MD 05/19/18 [phone]

## 2018-05-19 NOTE — ED Triage Notes (Signed)
Pt brought in via [facility] from home where she lives with her daughter. Daughter called EMS due to pt having SOB. Pt recently taken of hospice but is a DNR. Pt is in NAD on the CPAP.

## 2018-05-19 NOTE — H&P (Addendum)
[facility] [place] at [facility]   PATIENT NAME: Melissa Bright    MR#:  [id number]  DATE OF BIRTH:  March 24, 1924  DATE OF ADMISSION:  05/19/2018  PRIMARY CARE PHYSICIAN: [facility]   REQUESTING/REFERRING PHYSICIAN: Governor Rooks, MD  CHIEF COMPLAINT:   Chief Complaint  Patient presents with  . Shortness of Breath    HISTORY OF PRESENT ILLNESS:  Melissa Bright  is a 84 y.o. female with a known history of chronic combined systolic and diastolic heart failure, CAD, HTN, hx stroke, CKD IV who presented to the ED with shortness of breath. She has had intermittent shortness of breath for the last several weeks. Her family has been giving her home nebulizer treatments and her shortness of breath has improved. This morning, she took two nebulizer treatments, but she continued to be very short of breath, so her family brought her to the ED. She denies any fevers or chills. She endorses cough productive of brown sputum. She denies any chest pain. She endorses orthopnea, but denies worsening lower extremity edema. Of note, she has been bed bound since 2017. She is also on hospice.  In the ED, she initially required BiPAP for increased work of breathing, but was able to be transitioned to nasal cannula without any issues. Labs were significant for BNP 687. CXR with partial interim clearing of bibasilar infiltrates. She was given IV Lasix, IV solumedrol, and duonebs. She was admitted for further management.  PAST MEDICAL HISTORY:   Past Medical History:  Diagnosis Date  . Arthritis   . Blood transfusion without reported diagnosis   . CHF (congestive heart failure) (HCC)   . Coronary artery disease   . Hypertension   . Renal disorder   . Stroke [facility])     PAST SURGICAL HISTORY:   Past Surgical History:  Procedure Laterality Date  . ABDOMINAL HYSTERECTOMY    . CARDIAC SURGERY    . EYE SURGERY    . JOINT REPLACEMENT      SOCIAL HISTORY:   Social History    Tobacco Use  . Smoking status: Former Games developer  . Smokeless tobacco: Never Used  Substance Use Topics  . Alcohol use: No    FAMILY HISTORY:   Family History  Problem Relation Age of Onset  . Heart attack Father     DRUG ALLERGIES:   Allergies  Allergen Reactions  . Macrobid [employer] Macro] Other (See Comments)    Reaction: weakness and altered mental status    REVIEW OF SYSTEMS:   Review of Systems  Constitutional: Negative for chills and fever.  HENT: Positive for hearing loss. Negative for congestion and sore throat.   Eyes: Negative for blurred vision and double vision.  Respiratory: Positive for cough, sputum production, shortness of breath and wheezing.   Cardiovascular: Positive for orthopnea and leg swelling. Negative for chest pain.  Gastrointestinal: Negative for abdominal pain, constipation, diarrhea, nausea and vomiting.  Genitourinary: Negative for dysuria and frequency.  Musculoskeletal: Negative for back pain and neck pain.  Neurological: Negative for dizziness and headaches.  Psychiatric/Behavioral: Negative for depression. The patient is not nervous/anxious.     MEDICATIONS AT HOME:   Prior to Admission medications   Medication Sig Start Date End Date Taking? Authorizing Provider  carvedilol (COREG) 6.25 MG tablet Take 1 tablet (6.25 mg total) by mouth 2 (two) times daily with a meal. 05/25/17  Yes Altamese Dilling, MD  ferrous sulfate 325 (65 FE) MG EC tablet Take  1 tablet (325 mg total) by mouth 2 (two) times daily. 05/25/17 05/25/18 Yes [name], Vaibhavkumar, MD  furosemide (LASIX) 20 MG tablet Take 1 tablet (20 mg total) by mouth daily. 05/25/17 05/25/18 Yes [name], Vaibhavkumar, MD  hydrALAZINE (APRESOLINE) 50 MG tablet Take 25 mg by mouth 2 (two) times daily. 04/19/18  Yes [provider]  isosorbide mononitrate (IMDUR) 30 MG 24 hr tablet Take 1 tablet by mouth daily. 05/17/18  Yes [provider]  latanoprost  (XALATAN) 0.005 % ophthalmic solution Place 1 drop into both eyes at bedtime. 05/11/18  Yes [provider]  levothyroxine (SYNTHROID, LEVOTHROID) 25 MCG tablet Take 1 tablet by mouth daily. 03/15/18  Yes [provider]  LORazepam (ATIVAN) 0.5 MG tablet Take 1 tablet by mouth 2 (two) times daily. 04/19/18  Yes [provider]  senna-docusate (SENOKOT-S) 8.6-50 MG tablet Take 1 tablet by mouth 2 (two) times daily.    Yes [provider]  triamcinolone cream (KENALOG) 0.1 % Apply 1 application topically 2 (two) times daily.   Yes [provider]  feeding supplement, ENSURE ENLIVE, (ENSURE ENLIVE) LIQD Take 237 mLs by mouth 3 (three) times daily between meals. 05/25/17   [name], Vaibhavkumar, MD      VITAL SIGNS:  Blood pressure (!) 186/68, pulse 65, temperature (!) 97 F (36.1 C), temperature source Axillary, resp. rate 19, height 5\' 3"  (1.6 m), weight 68 kg, SpO2 100 %.  PHYSICAL EXAMINATION:  Physical Exam  GENERAL:  84 y.o.-year-old patient lying in the bed with no acute distress.  EYES: Pupils equal, round, reactive to light and accommodation. No scleral icterus. Extraocular muscles intact.  HEENT: Head atraumatic, normocephalic. Oropharynx and nasopharynx clear.  NECK:  Supple, no jugular venous distention. No thyroid enlargement, no tenderness.  LUNGS: Mildly increased work of breathing, normal RR, +diffuse expiratory wheezing throughout all lung fields, decreased air movement throughout, +bibasilar crackles, [place] in place. CARDIOVASCULAR: S1, S2 normal. No murmurs, rubs, or gallops.  ABDOMEN: Soft, nontender, nondistended. Bowel sounds present. No organomegaly or mass.  EXTREMITIES: 1+ pitting edema to the mid shins bilaterally, no cyanosis or clubbing.  NEUROLOGIC: Cranial nerves II through XII are intact. +global weakness. Sensation intact. Gait not checked.  PSYCHIATRIC: The patient is alert. Does not answer questions of orientation. SKIN:  No obvious rash, lesion, or ulcer.   LABORATORY PANEL:   CBC Recent Labs  Lab 05/19/18 1134  WBC 7.1  HGB 8.8*  HCT 27.1*  PLT 357   ------------------------------------------------------------------------------------------------------------------  Chemistries  Recent Labs  Lab 05/19/18 1134  NA 140  K 5.3*  CL 112*  CO2 23  GLUCOSE 164*  BUN 43*  CREATININE 2.49*  CALCIUM 8.6*   ------------------------------------------------------------------------------------------------------------------  Cardiac Enzymes Recent Labs  Lab 05/19/18 1134  TROPONINI <0.03   ------------------------------------------------------------------------------------------------------------------  RADIOLOGY:  Dg Chest Port 1 View  Result Date: 05/19/2018 CLINICAL DATA:  Shortness of breath. EXAM: PORTABLE CHEST 1 VIEW COMPARISON:  05/23/2017. FINDINGS: Mediastinum and hilar structures are normal. Heart size stable. Interim partial clearing of bibasilar infiltrates. No prominent pleural effusion or pneumothorax. Elevation left hemidiaphragm. Gastric distention. IMPRESSION: Number interim partial clearing of bibasilar infiltrates. Continued follow-up exams to demonstrate complete clearing suggested. 2.  Gastric distention with elevation of the left hemidiaphragm. Electronically Signed   By: Maisie Fushomas  Register   On: 05/19/2018 12:12      IMPRESSION AND PLAN:   Acute hypoxic respiratory failure- likely secondary to a combination of CHF and COPD exacerbation. Initially requiring BiPAP, but now  on 2L O2 by [place]. - continue IV lasix, IV solumedrol, and duonebs - wean O2 as able - cardiac monitoring  Acute on chronic combined systolic and diastolic heart failure- BNP elevated and patient has bibasilar crackles and lower extremity edema. - lasix 40mg  IV daily - continue coreg - ECHO - daily weights - strict I/O  Acute exacerbation of COPD- patient with diffuse wheezing on exam. History of  tobacco use x 50 years, but has not smoked in 30 years. - start levaquin due to large amount of sputum production - continue IV solumedrol - duonebs every 6 hours, albuterol prn - start spiriva daily - wean O2 as able  Gastric distension- seen on CXR. May be related to BiPAP. Patient denies abdominal pain, nausea, or vomiting. - repeat chest and abdominal x-ray in the morning  Hyperkalemia- likely related to CKD IV. Will most likely improve with albuterol nebs and IV lasix - check bmp in the morning  Hypertension- BPs elevated in the ED. - continue coreg and hydralazine - IV hydralazine prn  Hyperglycemia- glucose 164 in the ED - will hold off on checking CBGs due to patient being on hospice - monitor with daily BMPs  CKD IV- Cr close to baseline - avoid nephrotoxic agents - monitor bmp  CAD with hx of NSTEMI- no active chest pain. Initial trop negative and EKG without any ST/T wave changes. - continue home coreg and imdur  Hypothyroidism- last TSH 59. - continue home synthroid - check TSH, T3, T4  Anemia of chronic disease- hemoglobin at baseline, no signs of active bleeding. - continue home ferrous sulfate  Severe protein-calorie malnutrition- stable - continue home ensure  Plan for discharge back home with hospice.  All the records are reviewed and case discussed with ED provider. Management plans discussed with the patient, family and they are in agreement.  CODE STATUS: DNR  TOTAL TIME TAKING CARE OF THIS PATIENT: 45 minutes.    Jinny Blossom Ciena Sampley M.D on 05/19/2018 at 3:45 PM  Between 7am to 6pm - Pager - [phone]  After 6pm go to [email or web address] - Social research officer, government  [facility] [place] [facility]  Office  [phone]  CC: Primary care physician; [facility]   Note: This dictation was prepared with Dragon dictation along with smaller phrase technology. Any transcriptional errors that result from this process are unintentional.

## 2018-05-20 ENCOUNTER — Inpatient Hospital Stay
Admit: 2018-05-20 | Discharge: 2018-05-20 | Disposition: A | Discharge status: Home / Self Care | Payer: Medicare HMO | Attending: Internal Medicine | Admitting: Internal Medicine

## 2018-05-20 ENCOUNTER — Inpatient Hospital Stay: Payer: Medicare HMO

## 2018-05-20 LAB — CBC
HCT: 25.4 % — ABNORMAL LOW (ref 35.0–47.0)
Hemoglobin: 8.2 g/dL — ABNORMAL LOW (ref 12.0–16.0)
MCH: 30.8 pg (ref 26.0–34.0)
MCHC: 32.3 g/dL (ref 32.0–36.0)
MCV: 95.5 fL (ref 80.0–100.0)
Platelets: 330 10*3/uL (ref 150–440)
RBC: 2.66 MIL/uL — ABNORMAL LOW (ref 3.80–5.20)
RDW: 17.1 % — AB (ref 11.5–14.5)
WBC: 6.7 10*3/uL (ref 3.6–11.0)

## 2018-05-20 LAB — ECHOCARDIOGRAM COMPLETE
HEIGHTINCHES: 63 in
WEIGHTICAEL: 2678.4 [oz_av]

## 2018-05-20 LAB — BASIC METABOLIC PANEL
Anion gap: 8 (ref 5–15)
BUN: 45 mg/dL — AB (ref 8–23)
CO2: 23 mmol/L (ref 22–32)
CREATININE: 2.5 mg/dL — AB (ref 0.44–1.00)
Calcium: 8.9 mg/dL (ref 8.9–10.3)
Chloride: 111 mmol/L (ref 98–111)
GFR calc Af Amer: 18 mL/min — ABNORMAL LOW (ref >60)
GFR, EST NON AFRICAN AMERICAN: 16 mL/min — AB (ref >60)
Glucose, Bld: 168 mg/dL — ABNORMAL HIGH (ref 70–99)
POTASSIUM: 5.4 mmol/L — AB (ref 3.5–5.1)
SODIUM: 142 mmol/L (ref 135–145)

## 2018-05-20 MED ORDER — MUPIROCIN 2 % EX OINT
TOPICAL_OINTMENT | Freq: Two times a day (BID) | CUTANEOUS | Status: DC
  Administered 2018-05-20 – 2018-05-21 (×3): via NASAL
  Filled 2018-05-20: qty 22

## 2018-05-20 MED ORDER — METHYLPREDNISOLONE SODIUM SUCC 125 MG IJ SOLR: 60.0000 mg | Freq: Every day | INTRAMUSCULAR | Status: DC

## 2018-05-20 MED ORDER — LEVOTHYROXINE SODIUM 50 MCG PO TABS: 50.0000 ug | ORAL_TABLET | Freq: Every day | ORAL | Status: DC

## 2018-05-20 MED ORDER — LEVOTHYROXINE SODIUM 50 MCG PO TABS
50.0000 ug | ORAL_TABLET | Freq: Every day | ORAL | Status: DC
  Administered 2018-05-21: 50 ug via ORAL
  Filled 2018-05-20: qty 1

## 2018-05-20 NOTE — Progress Notes (Signed)
*  PRELIMINARY RESULTS* Echocardiogram 2D Echocardiogram has been performed.  Cristela BlueHege, Marvis Bakken 05/20/2018, 2:35 PM

## 2018-05-20 NOTE — Progress Notes (Addendum)
Patient has a pending referral for [facility] services at home but was [facility] prior to home assessment. CMRN Victorino DikeJennifer made aware. Writer spoke with both of patient's daughters [name] and Eber JonesCarolyn. Family knows to contact [facility] when patient returns home. She is NOT a current hospice patient. Patient seen sitting up in bed, alert, denied pain. Earlier was on 2 liters of oxygen, now on room air. Echocardiogram results pending. Updated notes faxed to referral. Dayna BarkerKaren Robertson RN, BSN, [facility], hospital Liaison [phone]

## 2018-05-20 NOTE — Progress Notes (Signed)
Pt is retaining urine this am, 607 on bladder scan, I&O cath done per order. Total output of 550.

## 2018-05-20 NOTE — Progress Notes (Addendum)
PT Cancellation Note  Patient Details Name: Melissa Bright MRN: [id number] DOB: 07/15/1924   Cancelled Treatment:    Reason Eval/Treat Not Completed: Other (comment).  PT consult received.  Chart reviewed.  Per chart and pt's nurse, pt bed bound at baseline.  Therapist verified pt's functional status with pt/family who was in pt's room and family verifying pt is bed bound.  Therapist asked if pt was assisted OOB, how that was performed and pt's family immediately stated that pt has not gotten OOB for the last year.  Per discussion with pt's family, no acute PT needs identified.  Will discontinue current PT order (pt's nurse and MD notified).  Hendricks LimesEmily Tynleigh Birt, PT 05/20/18, 11:55 AM [phone]

## 2018-05-20 NOTE — Care Management Note (Signed)
Case Management Note  Patient Details  Name: Melissa Bright MRN: [id number] Date of Birth: 05/08/1924  Subjective/Objective:     Admitted with acute respiratory failure. Plan to discharge home and resume hospice care once at home.  Patient has been weaned to room air 96%.  Spoke to ViciKaren with hospice and no needs identified at this time from case management.                Action/Plan:   Expected Discharge Date:  05/22/18               Expected Discharge Plan:     In-House Referral:     Discharge planning Services     Post Acute Care Choice:    Choice offered to:     DME Arranged:    DME Agency:     HH Arranged:    HH Agency:     Status of Service:     If discussed at [employer] Length of [employer] Meetings, dates discussed:    Additional Comments:  Sherren KernsJennifer L Traveion Ruddock, RN 05/20/2018, 4:45 PM

## 2018-05-20 NOTE — Clinical Social Work Note (Signed)
Clinical Social Work Assessment  Patient Details  Name: Melissa Bright MRN: [id number] Date of Birth: February 11, 1924  Date of referral:  05/20/18               Reason for consult:  Facility Placement                Permission sought to share information with:  Case Manager, Customer service manager, Family Supports Permission granted to share information::  Yes, Verbal Permission Granted  Name::        Agency::     Relationship::     Contact Information:     Housing/Transportation Living arrangements for the past 2 months:  Single Family Home Source of Information:  Adult Children Patient Interpreter Needed:  None Criminal Activity/Legal Involvement Pertinent to Current Situation/Hospitalization:  No - Comment as needed Significant Relationships:  Adult Children Lives with:  Adult Children Do you feel safe going back to the place where you live?  Yes Need for family participation in patient care:  Yes (Comment)  Care giving concerns:  Patient lives with adult daughters    Social Worker assessment / plan:  CSW consulted for facility placement. CSW met with patient and daughters Melissa Bright and Melissa Bright at bedside. Patient did not speak during assessment. Daughter states that they stay with patient and help care for her. Daughter states that patient has been bed bound for a long time and they dont expect her to walk again. Daughter states that they have already set up hospice at home and that is their discharge plan. RN CM is aware and Melissa Bright, hospice liaison is aware. CSW signing off. Please re consult if further needs arise.   Employment status:  Retired Nurse, adult PT Recommendations:  Not assessed at this time Information / Referral to community resources:     Patient/Family's Response to care:  Family thanked CSW for assistance   Patient/Family's Understanding of and Emotional Response to Diagnosis, Current Treatment, and Prognosis:   Daughter states they are in agreement with discharge plan   Emotional Assessment Appearance:  Appears stated age Attitude/Demeanor/Rapport:  Lethargic Affect (typically observed):  Withdrawn Orientation:  Oriented to Self Alcohol / Substance use:  Not Applicable Psych involvement (Current and /or in the community):  No (Comment)  Discharge Needs  Concerns to be addressed:  Discharge Planning Concerns Readmission within the last 30 days:  No Current discharge risk:  None Barriers to Discharge:  Continued Medical Work up   [employer], [place] 05/20/2018, 2:14 PM

## 2018-05-21 DIAGNOSIS — Z23 Encounter for immunization: Secondary | ICD-10-CM | POA: Diagnosis not present

## 2018-05-21 LAB — POTASSIUM: Potassium: 5.2 mmol/L — ABNORMAL HIGH (ref 3.5–5.1)

## 2018-05-21 MED ORDER — FUROSEMIDE 20 MG PO TABS
20.0000 mg | ORAL_TABLET | Freq: Every day | ORAL | Status: DC
  Administered 2018-05-21: 20 mg via ORAL
  Filled 2018-05-21: qty 1

## 2018-05-21 MED ORDER — BISACODYL 10 MG RE SUPP
10.0000 mg | Freq: Once | RECTAL | Status: AC
  Administered 2018-05-21: 10 mg via RECTAL
  Filled 2018-05-21: qty 1

## 2018-05-21 MED ORDER — PREDNISONE 10 MG PO TABS: ORAL_TABLET | ORAL | 0 refills | Status: AC

## 2018-05-21 MED ORDER — LEVOFLOXACIN 250 MG PO TABS: 250.0000 mg | ORAL_TABLET | ORAL | 0 refills | Status: AC

## 2018-05-21 MED ORDER — LEVOTHYROXINE SODIUM 25 MCG PO TABS: 50.0000 ug | ORAL_TABLET | Freq: Every day | ORAL | 0 refills | Status: AC

## 2018-05-21 MED ORDER — PREDNISONE 50 MG PO TABS: 50.0000 mg | ORAL_TABLET | Freq: Every day | ORAL | Status: DC

## 2018-05-21 NOTE — Care Management Note (Signed)
Case Management Note  Patient Details  Name: Melissa Bright MRN: [id number] Date of Birth: 08/13/1924  Subjective/Objective:   Notified [facility] of Discharge. Faxed discharge summary. Instructed [name] to call hospice when they get home and let them know they are home.                 Action/Plan:   Expected Discharge Date:  05/21/18               Expected Discharge Plan:  Home w Hospice Care  In-House Referral:     Discharge planning Services  CM Consult  Post Acute Care Choice:  Hospice Choice offered to:  Adult Children  DME Arranged:    DME Agency:     HH Arranged:  Disease Management HH Agency:  [facility]  Status of Service:  Completed, signed off  If discussed at Long Length of Stay Meetings, dates discussed:    Additional Comments:  Marily MemosLisa M Keshia Weare, RN 05/21/2018, 4:05 PM

## 2018-05-21 NOTE — Discharge Summary (Signed)
[facility] - [place] at [facility]   PATIENT NAME: Melissa Bright    MR#:  [id number]  DATE OF BIRTH:  28-Oct-1923  DATE OF ADMISSION:  05/19/2018 ADMITTING PHYSICIAN: Campbell Stall, MD  DATE OF DISCHARGE: 05/21/2018  PRIMARY CARE PHYSICIAN: [facility]    ADMISSION DIAGNOSIS:  Hypoxia [R09.02] COPD exacerbation (HCC) [J44.1] Acute renal failure, unspecified acute renal failure type (HCC) [N17.9] Congestive heart failure, unspecified HF chronicity, unspecified heart failure type (HCC) [I50.9]  DISCHARGE DIAGNOSIS:  Acute on chronic diastolic CHF Acute on chronic mild COPD flare CKD-III/IV  SECONDARY DIAGNOSIS:   Past Medical History:  Diagnosis Date  . Arthritis   . Blood transfusion without reported diagnosis   . CHF (congestive heart failure) (HCC)   . Coronary artery disease   . Hypertension   . Renal disorder   . Stroke [facility])     HOSPITAL COURSE:  Acute hypoxic respiratory failure- likely secondary to a combination of CHF and COPD exacerbation. Initially requiring BiPAP, but now on 2L O2 by [place]. - continue IV lasix, IV solumedrol, and duonebs - wean O2 as able - cardiac monitoring--remains sinus bradycardia 50-60  Acute on chronic combined  diastolic heart failure - BNP elevated and patient has bibasilar crackles and lower extremity edema. - lasix 40mg  IV daily--change to oral lasix - continue coreg - ECHO  Shows Left ventricle: Wall thickness was increased in a pattern of moderate LVH. Systolic function was normal. The estimated ejection fraction was in the range of 55% to 65%.  - daily weights - strict I/O -wean to RA  Acute exacerbation of COPD- patient presented with diffuse wheezing on exam. History of tobacco use x 50 years, but has not smoked in 30 years. - start levaquin due to large amount of sputum production - continue IV solumedrol--change to po taper - duonebs every 6 hours, albuterol prn - start  spiriva daily - wean O2 as able  Gastric distension- seen on CXR. May be related to BiPAP. Patient denies abdominal pain, nausea, or vomiting. - repeat chest and abdominal x-ray in the morning--stool on Abd xray-- no SBO -stool regimen--dulcolax today  Hyperkalemia- likely related to CKD IV. Will most likely improve with albuterol nebs and IV lasix - k 5.2. Telemetry-- NSR 67-70  Hypertension- BPs elevated in the ED. - continue coreg and hydralazine - IV hydralazine prn  CKD IV- Cr close to baseline - avoid nephrotoxic agents - monitor bmp  CAD with hx of NSTEMI- no active chest pain. Initial trop negative and EKG without any ST/T wave changes. - continue home coreg and imdur  Hypothyroidism- last TSH 59. - continue home synthroid--increased dose  Anemia of chronic disease- hemoglobin at baseline, no signs of active bleeding. - continue home ferrous sulfate  Overall nearing baseline. D/w dter Will d/c later today to home with Hospice CONSULTS OBTAINED:    DRUG ALLERGIES:   Allergies  Allergen Reactions  . Macrobid [employer] Macro] Other (See Comments)    Reaction: weakness and altered mental status    DISCHARGE MEDICATIONS:   Allergies as of 05/21/2018      Reactions   Macrobid [nitrofurantoin Monohyd Macro] Other (See Comments)   Reaction: weakness and altered mental status      Medication List    TAKE these medications   carvedilol 6.25 MG tablet Commonly known as:  COREG Take 1 tablet (6.25 mg total) by mouth 2 (two) times daily with a meal.   feeding supplement (ENSURE  ENLIVE) Liqd Take 237 mLs by mouth 3 (three) times daily between meals.   ferrous sulfate 325 (65 FE) MG EC tablet Take 1 tablet (325 mg total) by mouth 2 (two) times daily.   furosemide 20 MG tablet Commonly known as:  LASIX Take 1 tablet (20 mg total) by mouth daily.   hydrALAZINE 50 MG tablet Commonly known as:  APRESOLINE Take 25 mg by mouth 2 (two) times  daily.   isosorbide mononitrate 30 MG 24 hr tablet Commonly known as:  IMDUR Take 1 tablet by mouth daily.   latanoprost 0.005 % ophthalmic solution Commonly known as:  XALATAN Place 1 drop into both eyes at bedtime.   levofloxacin 250 MG tablet Commonly known as:  LEVAQUIN Take 1 tablet (250 mg total) by mouth every other day.   levothyroxine 25 MCG tablet Commonly known as:  SYNTHROID, LEVOTHROID Take 2 tablets (50 mcg total) by mouth daily. What changed:  how much to take   LORazepam 0.5 MG tablet Commonly known as:  ATIVAN Take 1 tablet by mouth 2 (two) times daily.   predniSONE 10 MG tablet Commonly known as:  DELTASONE Take 50 mg daily--taper by 10 mg then stop Start taking on:  05/22/2018   senna-docusate 8.6-50 MG tablet Commonly known as:  Senokot-S Take 1 tablet by mouth 2 (two) times daily.   triamcinolone cream 0.1 % Commonly known as:  KENALOG Apply 1 application topically 2 (two) times daily.       If you experience worsening of your admission symptoms, develop shortness of breath, life threatening emergency, suicidal or homicidal thoughts you must seek medical attention immediately by calling 911 or calling your MD immediately  if symptoms less severe.  You Must read complete instructions/literature along with all the possible adverse reactions/side effects for all the Medicines you take and that have been prescribed to you. Take any new Medicines after you have completely understood and accept all the possible adverse reactions/side effects.   Please note  You were cared for by a hospitalist during your hospital stay. If you have any questions about your discharge medications or the care you received while you were in the hospital after you are discharged, you can call the unit and asked to speak with the hospitalist on call if the hospitalist that took care of you is not available. Once you are discharged, your primary care physician will handle any  further medical issues. Please note that NO REFILLS for any discharge medications will be authorized once you are discharged, as it is imperative that you return to your primary care physician (or establish a relationship with a primary care physician if you do not have one) for your aftercare needs so that they can reassess your need for medications and monitor your lab values. Today   SUBJECTIVE   Overall improving. Poor cough reflux. Eating well per dter No resp distress  VITAL SIGNS:  Blood pressure (!) 143/60, pulse 61, temperature (!) 97.4 F (36.3 C), temperature source Oral, resp. rate 18, height 5\' 3"  (1.6 m), weight 76.6 kg, SpO2 96 %.  I/O:    Intake/Output Summary (Last 24 hours) at 05/21/2018 1200 Last data filed at 05/21/2018 0200 Gross per 24 hour  Intake -  Output 0 ml  Net 0 ml    PHYSICAL EXAMINATION:  GENERAL:  84 y.o.-year-old patient lying in the bed with no acute distress.  EYES: Pupils equal, round, reactive to light and accommodation. No scleral icterus. Extraocular muscles intact.  HEENT: Head atraumatic, normocephalic. Oropharynx and nasopharynx clear.  NECK:  Supple, no jugular venous distention. No thyroid enlargement, no tenderness.  LUNGS: decreased breath sounds bilaterally bases , no wheezing, rales,rhonchi or crepitation. No use of accessory muscles of respiration.  CARDIOVASCULAR: S1, S2 normal. No murmurs, rubs, or gallops.  ABDOMEN: Soft, non-tender, non-distended. Bowel sounds present. No organomegaly or mass.  EXTREMITIES: Chronic pedal edema, cyanosis, or clubbing.  NEUROLOGIC: Cranial nerves II through XII are intact. Muscle strength 5/5 in all extremities. Sensation intact. Gait not checked.  PSYCHIATRIC: The patient is alert and oriented x 3.  SKIN: No obvious rash, lesion, or ulcer.   DATA REVIEW:   CBC  Recent Labs  Lab 05/20/18 0412  WBC 6.7  HGB 8.2*  HCT 25.4*  PLT 330    Chemistries  Recent Labs  Lab 05/20/18 0412  05/21/18 1039  NA 142  --   K 5.4* 5.2*  CL 111  --   CO2 23  --   GLUCOSE 168*  --   BUN 45*  --   CREATININE 2.50*  --   CALCIUM 8.9  --     Microbiology Results   Recent Results (from the past 240 hour(s))  MRSA PCR Screening     Status: Abnormal   Collection Time: 05/19/18  8:12 PM  Result Value Ref Range Status   MRSA by PCR POSITIVE (A) NEGATIVE Final    Comment:        The GeneXpert MRSA Assay (FDA approved for NASAL specimens only), is one component of a comprehensive MRSA colonization surveillance program. It is not intended to diagnose MRSA infection nor to guide or monitor treatment for MRSA infections. RESULT CALLED TO, READ BACK BY AND VERIFIED WITH: KAT MURRAY @2200  05/19/18 AKT Performed at [facility], [address] [place], [place] [address]     RADIOLOGY:  Dg Chest Port 1 View  Result Date: 05/19/2018 CLINICAL DATA:  Shortness of breath. EXAM: PORTABLE CHEST 1 VIEW COMPARISON:  05/23/2017. FINDINGS: Mediastinum and hilar structures are normal. Heart size stable. Interim partial clearing of bibasilar infiltrates. No prominent pleural effusion or pneumothorax. Elevation left hemidiaphragm. Gastric distention. IMPRESSION: Number interim partial clearing of bibasilar infiltrates. Continued follow-up exams to demonstrate complete clearing suggested. 2.  Gastric distention with elevation of the left hemidiaphragm. Electronically Signed   By: Maisie Fushomas  Register   On: 05/19/2018 12:12   Dg Abd Acute W/chest  Result Date: 05/20/2018 CLINICAL DATA:  Abdominal distension EXAM: DG ABDOMEN ACUTE W/ 1V CHEST COMPARISON:  Chest radiograph May 19, 2018 FINDINGS: PA chest: There is atelectatic change in the right lower lung zone region. There is a questionable minimal right pleural effusion. There is no edema or consolidation. Heart is upper normal in size with pulmonary vascularity normal. No adenopathy. There is aortic atherosclerosis. Supine and upright  abdomen: There is moderate stool throughout colon. There is no bowel dilatation or air-fluid level to suggest bowel obstruction. No free air. There is aortoiliac atherosclerosis. There is a stent in a proximal mesenteric artery. Bones appear osteoporotic. IMPRESSION: Moderate stool in colon. No bowel obstruction or free air evident. Extensive arterial vascular calcification with aortoiliac and mesenteric arterial atherosclerosis. No lung edema or consolidation. Atelectatic change in the right base with questionable small right pleural effusion. Aortic Atherosclerosis (ICD10-I70.0). Electronically Signed   By: Bretta BangWilliam  Woodruff III M.D.   On: 05/20/2018 07:56     Management plans discussed with the patient, family and they are in agreement.  CODE STATUS:     Code Status Orders  (From admission, onward)         Start     Ordered   05/19/18 1807  Do not attempt resuscitation (DNR)  Continuous    Question Answer Comment  In the event of cardiac or respiratory ARREST Do not call a "code blue"   In the event of cardiac or respiratory ARREST Do not perform Intubation, CPR, defibrillation or ACLS   In the event of cardiac or respiratory ARREST Use medication by any route, position, wound care, and other measures to relive pain and suffering. May use oxygen, suction and manual treatment of airway obstruction as needed for comfort.      05/19/18 1806        Code Status History    Date Active Date Inactive Code Status Order ID Comments User Context   05/23/2017 0652 05/25/2017 1948 DNR [id number]  Arnaldo Natal, MD Inpatient   08/06/2016 1053 08/12/2016 1905 DNR [id number]  Eugenie Norrie, NP ED   08/06/2016 0856 08/06/2016 1053 DNR [id number]  Willy Eddy, MD ED   08/01/2016 2237 08/03/2016 1857 DNR [id number]  Oralia Manis, MD Inpatient   08/01/2016 2127 08/01/2016 2237 DNR [id number]  Loleta Rose, MD ED   07/12/2016 1502 07/17/2016 2316 DNR [id number]  Gracelyn Nurse, MD ED   07/12/2016  1257 07/12/2016 1502 DNR [id number]  Willy Eddy, MD ED   03/24/2016 2008 03/26/2016 1935 DNR [id number]  Houston Siren, MD Inpatient   11/21/2015 1130 11/22/2015 1831 DNR [id number]  Enedina Finner, MD Inpatient   11/20/2015 1312 11/21/2015 1130 Full Code [id number]  Katharina Caper, MD ED   11/09/2015 1626 11/10/2015 1719 DNR [id number]  Houston Siren, MD Inpatient    Advance Directive Documentation     Most Recent Value  Type of Advance Directive  Out of facility DNR (pink MOST or yellow form)  Pre-existing out of facility DNR order (yellow form or pink MOST form)  Pink MOST form placed in chart (order not valid for inpatient use)  "MOST" Form in Place?  -      TOTAL TIME TAKING CARE OF THIS PATIENT: 40 minutes.    Enedina Finner M.D on 05/21/2018 at 12:00 PM  Between 7am to 6pm - Pager - [phone] After 6pm go to [email or web address] - Social research officer, government  [facility] [place] [facility]  Office  [phone]  CC: Primary care physician; [facility]

## 2018-05-21 NOTE — Progress Notes (Signed)
[facility] - [place] at [facility]   PATIENT NAME: Melissa Bright    MR#:  [id number]  DATE OF BIRTH:  03/05/1924  SUBJECTIVE:   Came in from home with sob. Pt is bed bound. Lives with dters REVIEW OF SYSTEMS:   Review of Systems  Unable to perform ROS: Dementia   Tolerating Diet:yes Tolerating PT: bed bound  DRUG ALLERGIES:   Allergies  Allergen Reactions  . Macrobid [employer][Nitrofurantoin Monohyd Macro] Other (See Comments)    Reaction: weakness and altered mental status    VITALS:  Blood pressure (!) 143/60, pulse 61, temperature (!) 97.4 F (36.3 C), temperature source Oral, resp. rate 18, height 5\' 3"  (1.6 m), weight 76.6 kg, SpO2 96 %.  PHYSICAL EXAMINATION:   Physical Exam  GENERAL:  84 y.o.-year-old patient lying in the bed with no acute distress.  EYES: Pupils equal, round, reactive to light and accommodation. No scleral icterus. Extraocular muscles intact.  HEENT: Head atraumatic, normocephalic. Oropharynx and nasopharynx clear.  NECK:  Supple, no jugular venous distention. No thyroid enlargement, no tenderness.  LUNGS: Normal breath sounds bilaterally, no wheezing, rales, rhonchi. No use of accessory muscles of respiration.  CARDIOVASCULAR: S1, S2 normal. No murmurs, rubs, or gallops.  ABDOMEN: Soft, nontender, nondistended. Bowel sounds present. No organomegaly or mass.  EXTREMITIES: No cyanosis, clubbing  += chronic dependend=tedema b/l.    NEUROLOGIC: no focal deficits PSYCHIATRIC:  patient is alert and oriented x 3.  SKIN: No obvious rash, lesion, or ulcer.   LABORATORY PANEL:  CBC Recent Labs  Lab 05/20/18 0412  WBC 6.7  HGB 8.2*  HCT 25.4*  PLT 330    Chemistries  Recent Labs  Lab 05/20/18 0412  NA 142  K 5.4*  CL 111  CO2 23  GLUCOSE 168*  BUN 45*  CREATININE 2.50*  CALCIUM 8.9   Cardiac Enzymes Recent Labs  Lab 05/19/18 1134  TROPONINI <0.03   RADIOLOGY:  Dg Chest Port 1 View  Result Date:  05/19/2018 CLINICAL DATA:  Shortness of breath. EXAM: PORTABLE CHEST 1 VIEW COMPARISON:  05/23/2017. FINDINGS: Mediastinum and hilar structures are normal. Heart size stable. Interim partial clearing of bibasilar infiltrates. No prominent pleural effusion or pneumothorax. Elevation left hemidiaphragm. Gastric distention. IMPRESSION: Number interim partial clearing of bibasilar infiltrates. Continued follow-up exams to demonstrate complete clearing suggested. 2.  Gastric distention with elevation of the left hemidiaphragm. Electronically Signed   By: Maisie Fushomas  Register   On: 05/19/2018 12:12   Dg Abd Acute W/chest  Result Date: 05/20/2018 CLINICAL DATA:  Abdominal distension EXAM: DG ABDOMEN ACUTE W/ 1V CHEST COMPARISON:  Chest radiograph May 19, 2018 FINDINGS: PA chest: There is atelectatic change in the right lower lung zone region. There is a questionable minimal right pleural effusion. There is no edema or consolidation. Heart is upper normal in size with pulmonary vascularity normal. No adenopathy. There is aortic atherosclerosis. Supine and upright abdomen: There is moderate stool throughout colon. There is no bowel dilatation or air-fluid level to suggest bowel obstruction. No free air. There is aortoiliac atherosclerosis. There is a stent in a proximal mesenteric artery. Bones appear osteoporotic. IMPRESSION: Moderate stool in colon. No bowel obstruction or free air evident. Extensive arterial vascular calcification with aortoiliac and mesenteric arterial atherosclerosis. No lung edema or consolidation. Atelectatic change in the right base with questionable small right pleural effusion. Aortic Atherosclerosis (ICD10-I70.0). Electronically Signed   By: Bretta BangWilliam  Woodruff III M.D.   On: 05/20/2018 07:56   ASSESSMENT AND  PLAN:  Acute hypoxic respiratory failure- likely secondary to a combination of CHF and COPD exacerbation. Initially requiring BiPAP, but now on 2L O2 by [place]. - continue IV lasix, IV  solumedrol, and duonebs - wean O2 as able - cardiac monitoring--remains sinus bradycardia 50-60  Acute on chronic combined systolic and diastolic heart failure - BNP elevated and patient has bibasilar crackles and lower extremity edema. - lasix 40mg  IV daily--change to oral lasix - continue coreg - ECHO  Shows Left ventricle: Wall thickness was increased in a pattern of   moderate LVH. Systolic function was normal. The estimated   ejection fraction was in the range of 55% to 65%.  - daily weights - strict I/O  Acute exacerbation of COPD- patient with diffuse wheezing on exam. History of tobacco use x 50 years, but has not smoked in 30 years. - start levaquin due to large amount of sputum production - continue IV solumedrol--change to po taper - duonebs every 6 hours, albuterol prn - start spiriva daily - wean O2 as able  Gastric distension- seen on CXR. May be related to BiPAP. Patient denies abdominal pain, nausea, or vomiting. - repeat chest and abdominal x-ray in the morning--stool on Abd xray-- no SBO -stool regimen  Hyperkalemia- likely related to CKD IV. Will most likely improve with albuterol nebs and IV lasix - check bmp in the morning  Hypertension- BPs elevated in the ED. - continue coreg and hydralazine - IV hydralazine prn  Hyperglycemia- glucose 164 in the ED - will hold off on checking CBGs due to patient being on hospice - monitor with daily BMPs  CKD IV- Cr close to baseline - avoid nephrotoxic agents - monitor bmp  CAD with hx of NSTEMI- no active chest pain. Initial trop negative and EKG without any ST/T wave changes. - continue home coreg and imdur  Hypothyroidism- last TSH 59. - continue home synthroid--increased dose  Anemia of chronic disease- hemoglobin at baseline, no signs of active bleeding. - continue home ferrous sulfate   Plan for discharge back home with hospice.   Case discussed with Care Management/Social  Worker. Management plans discussed with the patient, family and they are in agreement.  CODE STATUS: DNR  DVT Prophylaxis: heparin  TOTAL TIME TAKING CARE OF THIS PATIENT: *30 minutes.  >50% time spent on counselling and coordination of care  POSSIBLE D/C IN *1-2* DAYS, DEPENDING ON CLINICAL CONDITION.  Note: This dictation was prepared with Dragon dictation along with smaller phrase technology. Any transcriptional errors that result from this process are unintentional.  Enedina Finner M.D on 05/21/2018 at 9:38 AM  Between 7am to 6pm - Pager - [phone]  After 6pm go to [email or web address] - Social research officer, government  [facility] [place] [facility]  Office  [phone]  CC: Primary care physician; [facility] MakingPatient ID: Melissa Bright, female   DOB: [birth year], 84 y.o.   MRN: [id number]

## 2018-05-23 LAB — T3: T3, Total: 62 ng/dL — ABNORMAL LOW (ref 71–180)

## 2018-05-25 ENCOUNTER — Other Ambulatory Visit: Payer: Self-pay | Admitting: *Deleted

## 2018-05-25 NOTE — Patient Outreach (Addendum)
[facility]) Care Management  05/25/2018  Melissa Bright 17-Jan-1924 [id number]   EMMI-general discharge discharged from [facility] 05/21/18 home with hospice  RED ON EMMI ALERT Day # 1 Date: 05/24/18  Red Alert Reason: Scheduled follow up? Doesn't apply Unfilled prescriptions? Yes    Outreach attempt #1 successful to her daughter, Melissa Bright is able to to verify HIPAA [facility] Care Management RN reviewed and addressed red alert with patient Melissa Bright confirms the answers to the EMMI questions are correct Melissa Bright clarifies the [name] is home and bed bound and is seen by [facility] house calls and Hospice  Melissa Bright clarifies that all prescriptions are filled but the pharmacy was not opened until Monday to get extra prescriptions filled     Social: [name] is widowed, home and bed bound hospice patient being taken care of by her daughters, Melissa Bright and Melissa Bright for about 2 years. She is being seen by hospice in her home twice a week.  She had [place] hosp[facility] visit on Monday 05/23/18 and next visit is on 05/26/18 Thursday She is total care    Conditions: acute on chronic diastolic chf, copd, ckd 3-4, arthritis, cad, htn, stroke     Medications: denies concerns with taking medications as prescribed, affording medications, side effects of medications and questions about medications    Appointments: seen by hospice twice a week next vistit in Thursday 05/26/18    Advance Directives: have advance directives - living will and POA POA is Melissa Bright     DME hospital bed, nebulizer, walker   Consent: [facility] RN CM reviewed [facility] services with patient. Patient gave verbal consent for services. He denies need of services from [facility]/Telephonic RN CM, pharmacy, health coach, NP or SW at this time  Advised patient that there will be further automated EMMI- post discharge calls to assess how the patient is doing following the recent hospitalization Advised the patient that another call  may be received from a nurse if any of their responses were abnormal. Patient voiced understanding and was appreciative of f/u call.   Plan: [facility] RN CM will close case at this time as patient has been assessed and no needs identified.    [facility] RN CM encouraged Melissa Bright to contact [facility] as needed  [facility] RN CM sent a successful outreach letter as discussed with [facility] brochure enclosed for review    Melissa L. Noelle Penner, RN, BSN, CCM [facility] Telephonic Care Management Care Coordinator Office number [phone] Mobile number [phone]  Main [facility] number [phone] Fax number [phone]

## 2018-06-02 ENCOUNTER — Ambulatory Visit: Payer: Self-pay | Admitting: Family

## 2018-06-12 DIAGNOSIS — R0602 Shortness of breath: Secondary | ICD-10-CM | POA: Diagnosis not present

## 2018-06-12 DIAGNOSIS — J168 Pneumonia due to other specified infectious organisms: Secondary | ICD-10-CM | POA: Diagnosis not present

## 2018-07-13 DIAGNOSIS — R0602 Shortness of breath: Secondary | ICD-10-CM | POA: Diagnosis not present

## 2018-07-13 DIAGNOSIS — J168 Pneumonia due to other specified infectious organisms: Secondary | ICD-10-CM | POA: Diagnosis not present

## 2018-07-31 DEATH — deceased

## 2018-08-12 DIAGNOSIS — R0602 Shortness of breath: Secondary | ICD-10-CM | POA: Diagnosis not present

## 2018-08-12 DIAGNOSIS — J168 Pneumonia due to other specified infectious organisms: Secondary | ICD-10-CM | POA: Diagnosis not present

## 2018-08-26 IMAGING — DX DG CHEST 1V PORT
1 series · 1 of 1 positions shown · non-contrast
Comparison: 11/20/2015

CLINICAL DATA: Chest pressure and discomfort

EXAM:
PORTABLE CHEST 1 VIEW

[chest ap]
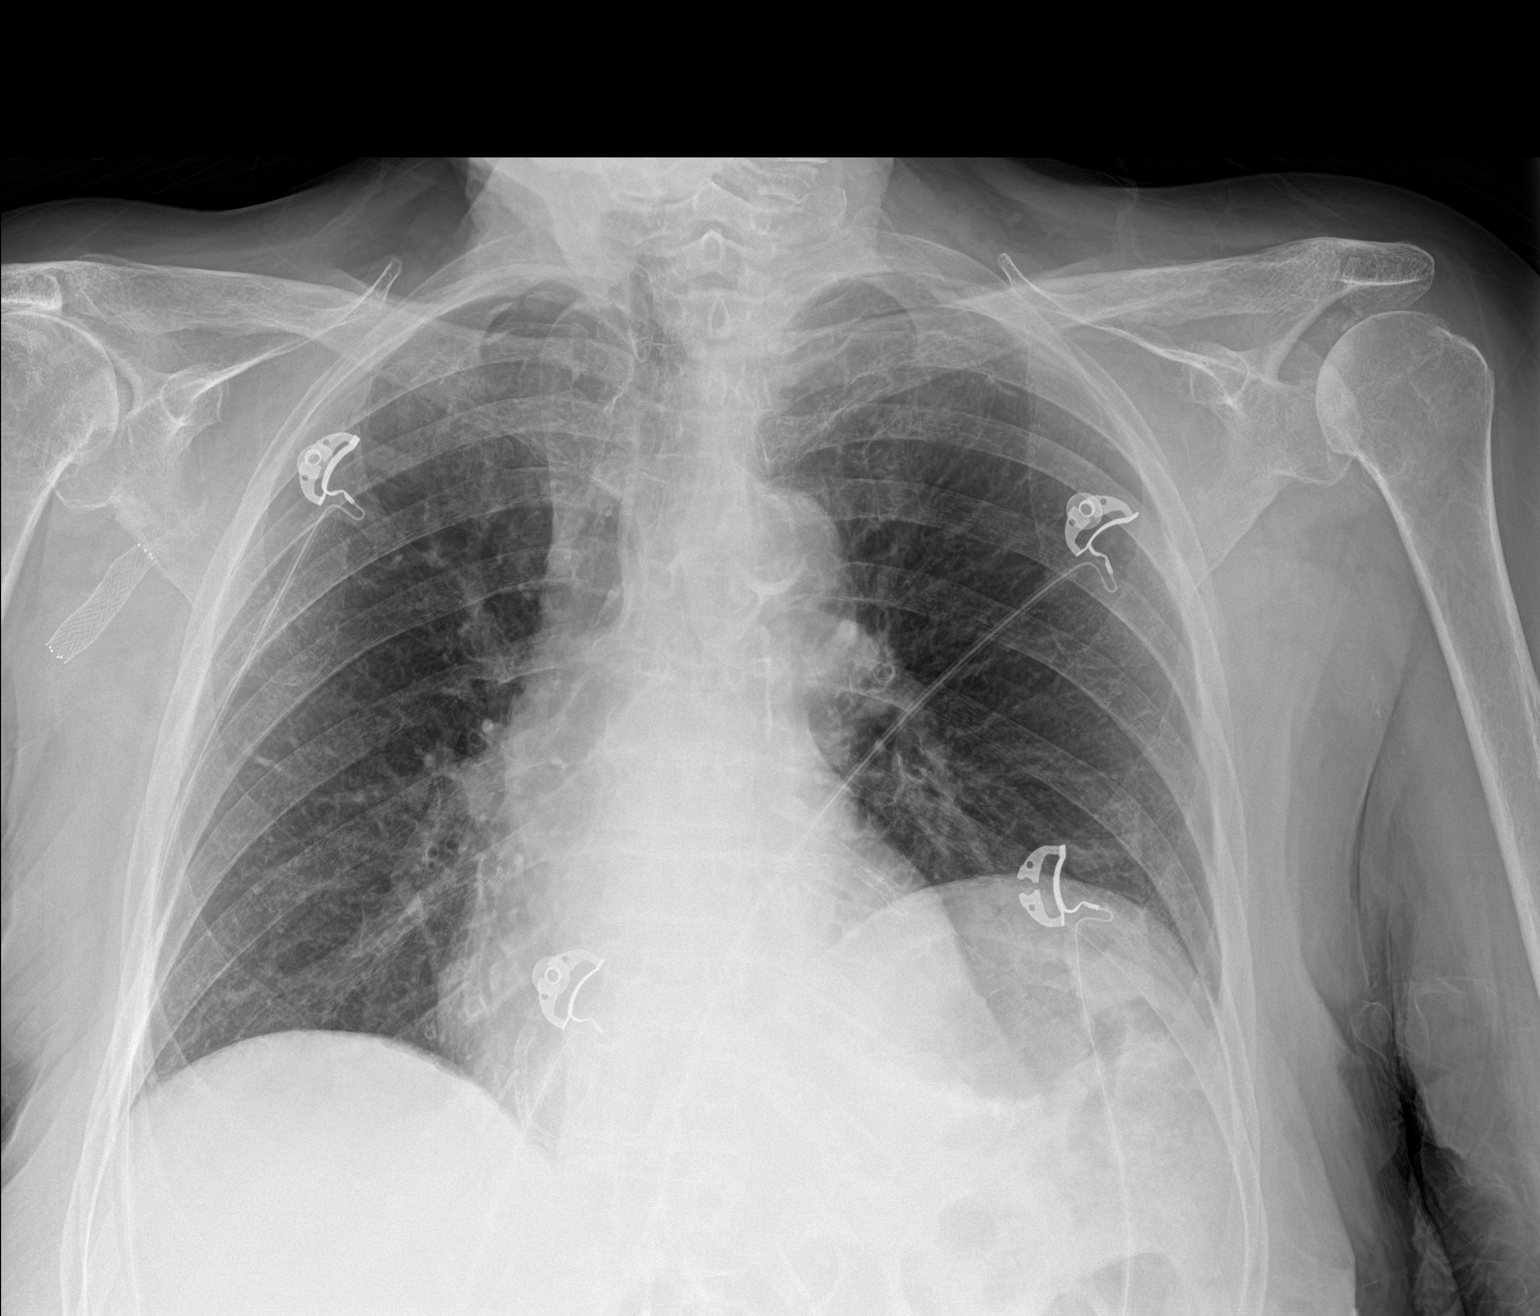

[1 of 1 positions shown; findings below may reference images not displayed]

FINDINGS: Aortic atherosclerosis noted. The heart size and mediastinal
contours are within normal limits. Both lungs are clear. The
visualized skeletal structures are unremarkable.
IMPRESSION: No active disease.

## 2018-09-12 DIAGNOSIS — R0602 Shortness of breath: Secondary | ICD-10-CM | POA: Diagnosis not present

## 2018-09-12 DIAGNOSIS — J168 Pneumonia due to other specified infectious organisms: Secondary | ICD-10-CM | POA: Diagnosis not present

## 2020-07-03 IMAGING — CR DG ABDOMEN ACUTE W/ 1V CHEST
1 series · 3 of 3 positions shown · non-contrast
Comparison: Chest radiograph May 19, 2018

CLINICAL DATA: Abdominal distension

EXAM:
DG ABDOMEN ACUTE W/ 1V CHEST

[Series 1: dg abd acute w/chest · 0.14mm/px · 3 of 3 slices shown]
[im 1/3]
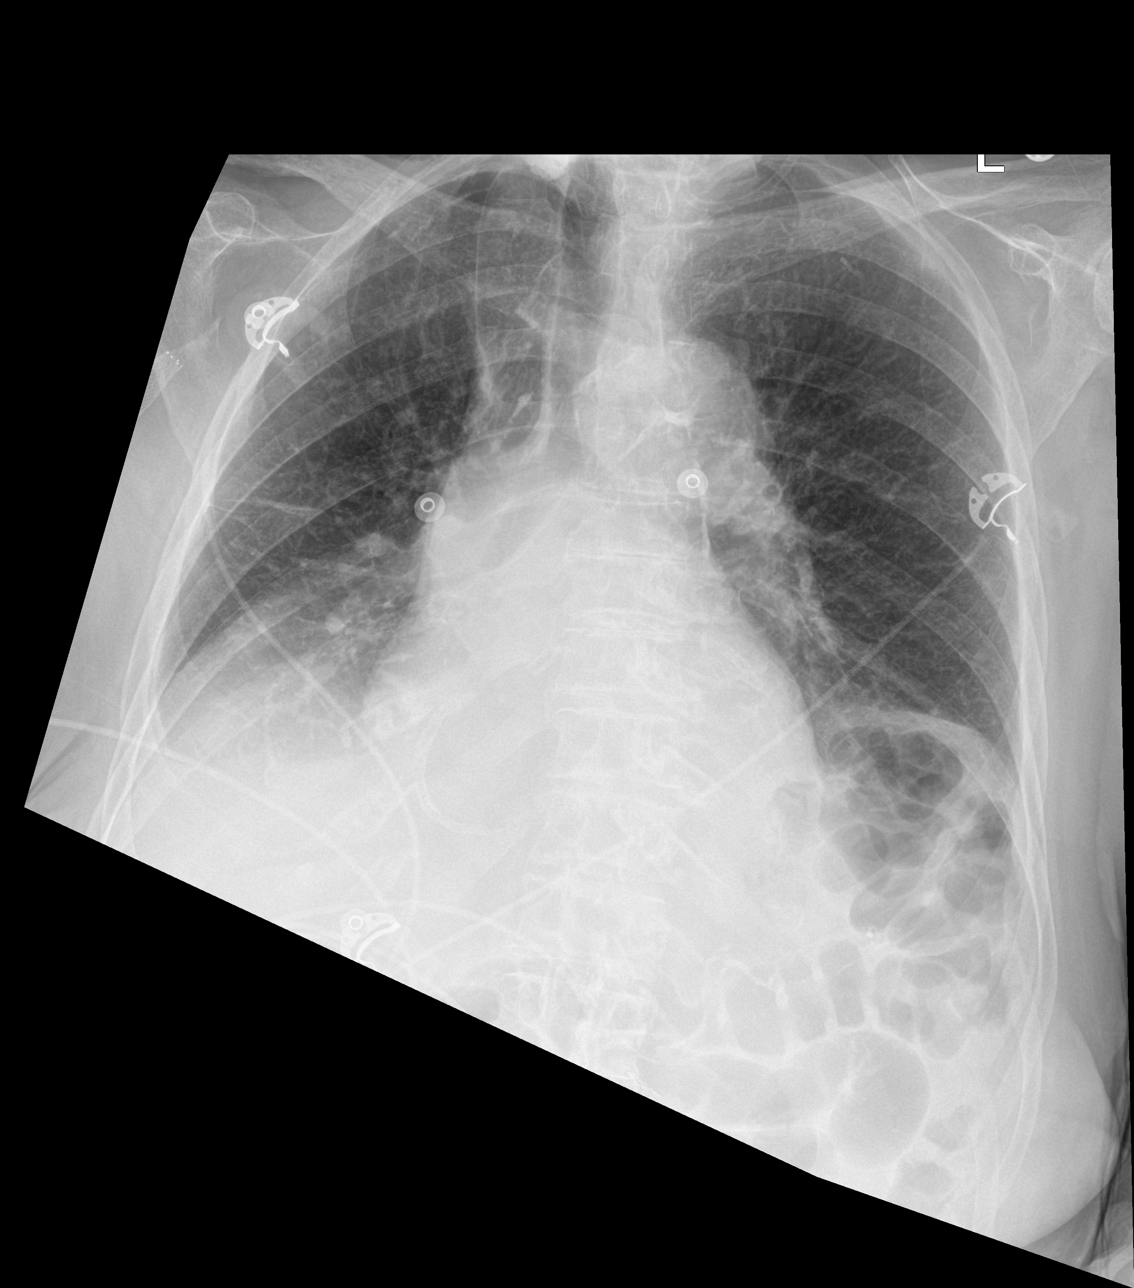
[im 2/3]
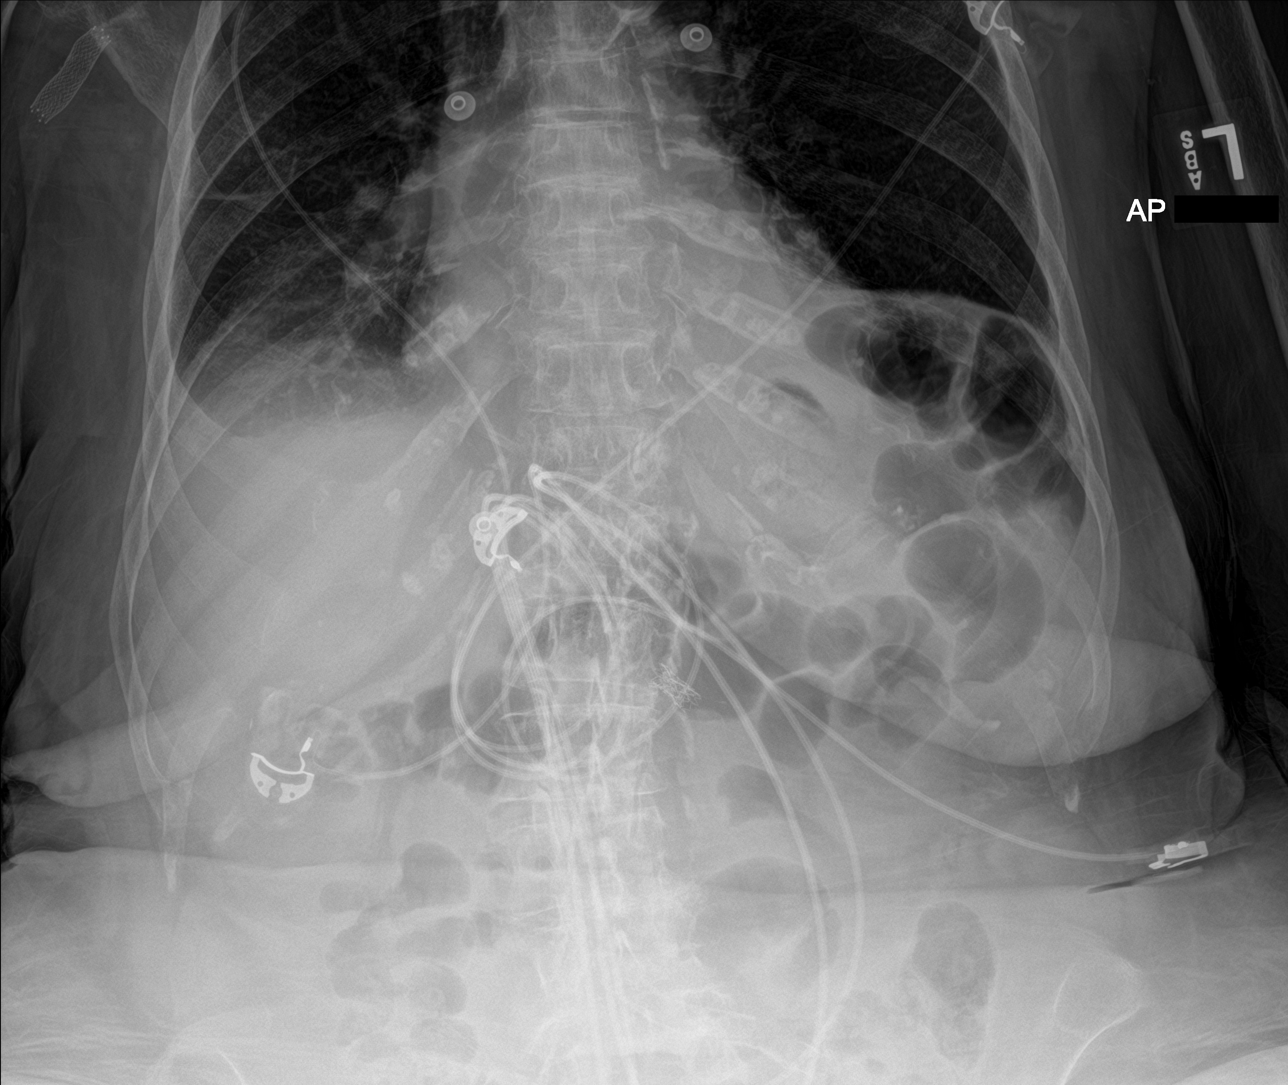
[im 3/3]
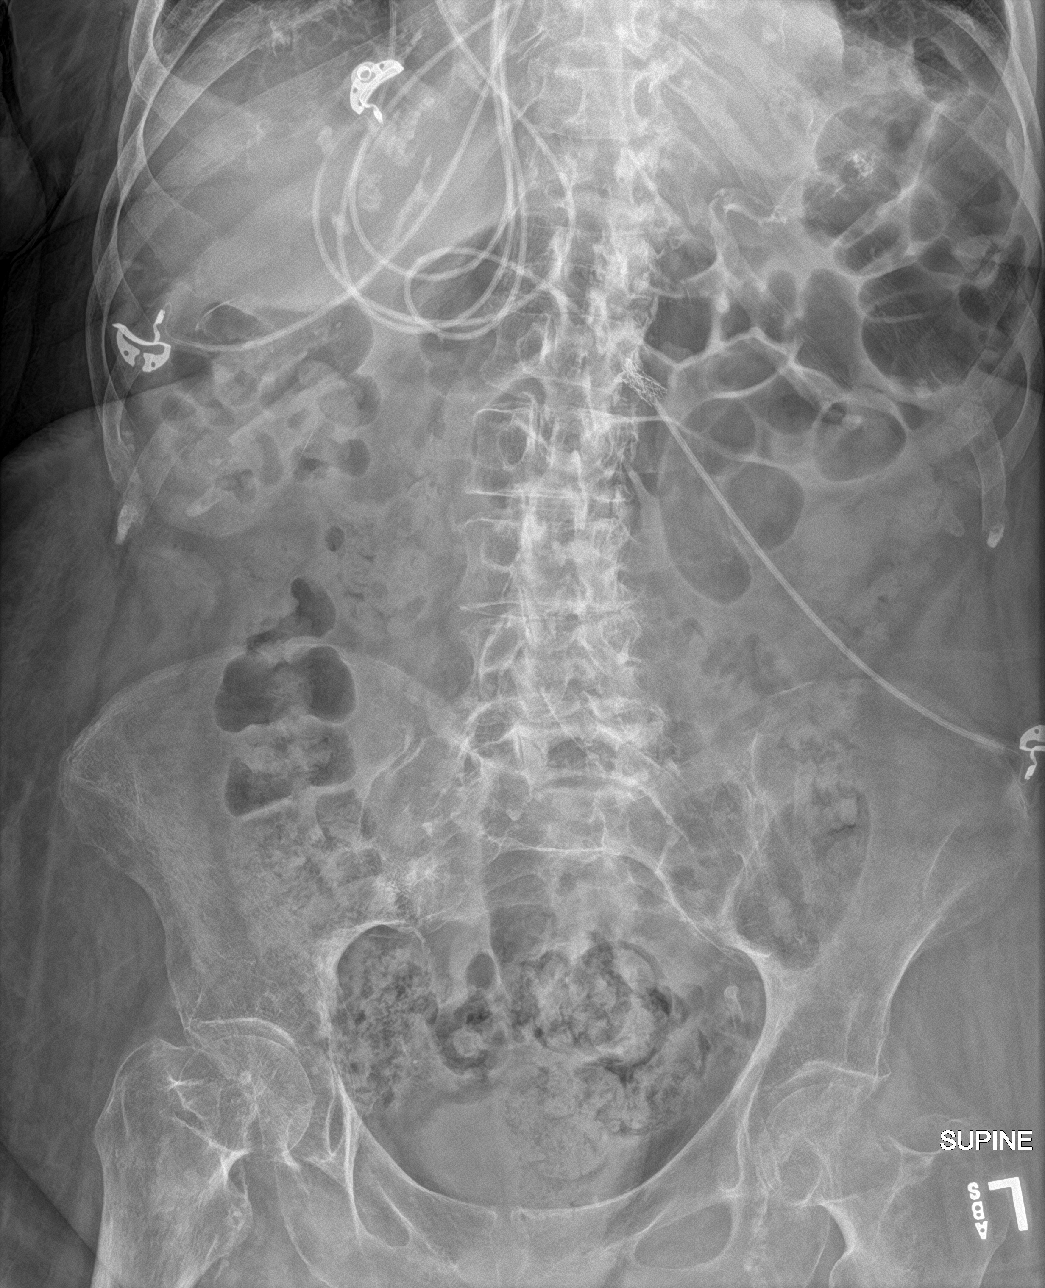

[3 of 3 positions shown; findings below may reference images not displayed]

FINDINGS: PA chest: There is atelectatic change in the right lower lung zone
region. There is a questionable minimal right pleural effusion.
There is no edema or consolidation. Heart is upper normal in size
with pulmonary vascularity normal. No adenopathy. There is aortic
atherosclerosis.

Supine and upright abdomen: There is moderate stool throughout
colon. There is no bowel dilatation or air-fluid level to suggest
bowel obstruction. No free air. There is aortoiliac atherosclerosis.
There is a stent in a proximal mesenteric artery. Bones appear
osteoporotic.
IMPRESSION: Moderate stool in colon. No bowel obstruction or free air evident.
Extensive arterial vascular calcification with aortoiliac and
mesenteric arterial atherosclerosis. No lung edema or consolidation.
Atelectatic change in the right base with questionable small right
pleural effusion.

Aortic Atherosclerosis ([id number]).
